# Patient Record
Sex: Female | Born: 1970 | Race: White | Hispanic: No | Marital: Married | Smoking: Current every day smoker
Health system: Western US, Academic
[De-identification: ages and names within clinical notes are randomized; demographics above are authoritative.]

## PROBLEM LIST (undated history)

## (undated) DIAGNOSIS — R188 Other ascites: Secondary | ICD-10-CM

## (undated) DIAGNOSIS — K7460 Unspecified cirrhosis of liver: Secondary | ICD-10-CM

## (undated) DIAGNOSIS — K8020 Calculus of gallbladder without cholecystitis without obstruction: Secondary | ICD-10-CM

## (undated) DIAGNOSIS — K829 Disease of gallbladder, unspecified: Secondary | ICD-10-CM

## (undated) DIAGNOSIS — F988 Other specified behavioral and emotional disorders with onset usually occurring in childhood and adolescence: Secondary | ICD-10-CM

## (undated) DIAGNOSIS — Z8614 Personal history of Methicillin resistant Staphylococcus aureus infection: Secondary | ICD-10-CM

## (undated) DIAGNOSIS — G4700 Insomnia, unspecified: Secondary | ICD-10-CM

## (undated) DIAGNOSIS — Z87898 Personal history of other specified conditions: Secondary | ICD-10-CM

## (undated) DIAGNOSIS — J3089 Other allergic rhinitis: Secondary | ICD-10-CM

## (undated) DIAGNOSIS — F4310 Post-traumatic stress disorder, unspecified: Secondary | ICD-10-CM

## (undated) DIAGNOSIS — J302 Other seasonal allergic rhinitis: Secondary | ICD-10-CM

## (undated) DIAGNOSIS — B009 Herpesviral infection, unspecified: Secondary | ICD-10-CM

## (undated) DIAGNOSIS — F419 Anxiety disorder, unspecified: Secondary | ICD-10-CM

## (undated) DIAGNOSIS — G43909 Migraine, unspecified, not intractable, without status migrainosus: Secondary | ICD-10-CM

## (undated) DIAGNOSIS — F32A Depression, unspecified: Secondary | ICD-10-CM

## (undated) DIAGNOSIS — R002 Palpitations: Secondary | ICD-10-CM

## (undated) DIAGNOSIS — R011 Cardiac murmur, unspecified: Secondary | ICD-10-CM

## (undated) DIAGNOSIS — A4902 Methicillin resistant Staphylococcus aureus infection, unspecified site: Secondary | ICD-10-CM

## (undated) DIAGNOSIS — F411 Generalized anxiety disorder: Secondary | ICD-10-CM

## (undated) DIAGNOSIS — Z973 Presence of spectacles and contact lenses: Secondary | ICD-10-CM

## (undated) HISTORY — DX: Anxiety disorder, unspecified: F41.9

## (undated) HISTORY — DX: Palpitations: R00.2

## (undated) HISTORY — DX: Depression, unspecified: F32.A

## (undated) HISTORY — DX: Disease of gallbladder, unspecified: K82.9

## (undated) HISTORY — DX: Insomnia, unspecified: G47.00

## (undated) HISTORY — DX: Cardiac murmur, unspecified: R01.1

## (undated) HISTORY — DX: Herpesviral infection, unspecified: B00.9

## (undated) HISTORY — PX: REDUCTION MAMMAPLASTY: SUR839

## (undated) HISTORY — DX: Other seasonal allergic rhinitis: J30.2

## (undated) HISTORY — DX: Post-traumatic stress disorder, unspecified: F43.10

## (undated) HISTORY — DX: Other specified behavioral and emotional disorders with onset usually occurring in childhood and adolescence: F98.8

## (undated) HISTORY — DX: Methicillin resistant Staphylococcus aureus infection, unspecified site: A49.02

## (undated) HISTORY — DX: Migraine, unspecified, not intractable, without status migrainosus: G43.909

## (undated) HISTORY — DX: Other ascites: R18.8

## (undated) HISTORY — DX: Unspecified cirrhosis of liver (CMS-HCC): K74.60

---

## 1997-03-10 HISTORY — PX: REDUCTION MAMMAPLASTY: SUR839

## 1998-05-09 ENCOUNTER — Other Ambulatory Visit: Admission: RE | Admit: 1998-05-09 | Discharge: 1998-05-09 | Payer: Self-pay | Admitting: *Deleted

## 1998-05-18 ENCOUNTER — Ambulatory Visit (HOSPITAL_BASED_OUTPATIENT_CLINIC_OR_DEPARTMENT_OTHER): Admission: RE | Admit: 1998-05-18 | Discharge: 1998-05-18 | Payer: Self-pay | Admitting: Specialist

## 1999-08-27 ENCOUNTER — Other Ambulatory Visit: Admission: RE | Admit: 1999-08-27 | Discharge: 1999-08-27 | Payer: Self-pay | Admitting: *Deleted

## 2000-03-26 ENCOUNTER — Inpatient Hospital Stay (HOSPITAL_COMMUNITY): Admission: AD | Admit: 2000-03-26 | Discharge: 2000-03-29 | Payer: Self-pay | Admitting: *Deleted

## 2000-05-12 ENCOUNTER — Other Ambulatory Visit: Admission: RE | Admit: 2000-05-12 | Discharge: 2000-05-12 | Payer: Self-pay | Admitting: *Deleted

## 2001-09-09 ENCOUNTER — Other Ambulatory Visit: Admission: RE | Admit: 2001-09-09 | Discharge: 2001-09-09 | Payer: Self-pay | Admitting: *Deleted

## 2003-02-23 ENCOUNTER — Other Ambulatory Visit: Admission: RE | Admit: 2003-02-23 | Discharge: 2003-02-23 | Payer: Self-pay | Admitting: *Deleted

## 2004-04-09 ENCOUNTER — Other Ambulatory Visit: Admission: RE | Admit: 2004-04-09 | Discharge: 2004-04-09 | Payer: Self-pay | Admitting: Obstetrics and Gynecology

## 2007-08-13 ENCOUNTER — Emergency Department (HOSPITAL_COMMUNITY): Admission: EM | Admit: 2007-08-13 | Discharge: 2007-08-13 | Payer: Self-pay | Admitting: Emergency Medicine

## 2010-07-26 NOTE — H&P (Signed)
[facility]  Patient:    Monica [name], Monica [name]                        MRN: [id number] Adm. Date:  03/26/00 Attending:  Duke Salvia. Marcelle Overlie, M.D.                         History and Physical  CHIEF COMPLAINT:              Postdates pregnancy, large estimated fetal                               weight.  HISTORY OF PRESENT ILLNESS:   A 40 year old G2, P0-0-1-0, EDD is January 15, EGA is 40+ weeks gestation. She was seen in the office January 16 with an ultrasound biophysical profile 8/8, AFI was 15.2 with and EFW of 8.4 pounds. Cervix was closed, long, -3 station. Presents now for two stage induction. Group B strep screen was negative. One-hour GTT was 105. Blood type was A positive. Rubella titer was positive.  PAST MEDICAL HISTORY:  ALLERGIES:                    None.  OPERATIONS:                   TAB x 1. Had prior breast reduction.  REVIEW OF SYSTEMS:            Otherwise unremarkable.  PHYSICAL EXAMINATION:  VITAL SIGNS:                  Temperature 98.2, blood pressure 124/60.  HEENT:                        Unremarkable.  NECK:                         Supple without masses.  LUNGS:                        Clear.  CARDIOVASCULAR:               Regular rate and rhythm without murmurs, rubs, gallops.  BREASTS:                      Not examined.  ABDOMEN:                      Term fundal height. Fetal heart rate 140s. PELVIC:                       Cervix was closed, long, vertex, -3.  EXTREMITIES:                  Unremarkable.  NEURO:                        Unremarkable.  IMPRESSION:                   A 40+ week intrauterine pregnancy, estimated                               fetal weight 8.4 pounds.  PLAN:  Two stage labor induction. This process of Cytotec followed by Pitocin labor induction was reviewed with the patient prior to admission. DD:  03/26/00 TD:  03/26/00 Job: [id number]

## 2010-12-05 LAB — WOUND CULTURE: Gram Stain: NONE SEEN

## 2014-06-22 ENCOUNTER — Other Ambulatory Visit: Payer: Self-pay | Admitting: Nurse Practitioner

## 2014-06-22 ENCOUNTER — Other Ambulatory Visit (HOSPITAL_COMMUNITY)
Admission: RE | Admit: 2014-06-22 | Discharge: 2014-06-22 | Disposition: A | Discharge status: Home / Self Care | Payer: BC Managed Care – PPO | Source: Ambulatory Visit | Attending: Nurse Practitioner | Admitting: Nurse Practitioner

## 2014-06-22 DIAGNOSIS — Z01419 Encounter for gynecological examination (general) (routine) without abnormal findings: Secondary | ICD-10-CM | POA: Insufficient documentation

## 2014-06-22 DIAGNOSIS — Z1151 Encounter for screening for human papillomavirus (HPV): Secondary | ICD-10-CM | POA: Diagnosis present

## 2014-06-26 LAB — CYTOLOGY - PAP

## 2015-07-23 ENCOUNTER — Encounter (HOSPITAL_COMMUNITY): Payer: Self-pay | Admitting: Emergency Medicine

## 2015-07-23 ENCOUNTER — Emergency Department (HOSPITAL_COMMUNITY)
Admission: EM | Admit: 2015-07-23 | Discharge: 2015-07-23 | Disposition: A | Discharge status: Home / Self Care | Payer: BC Managed Care – PPO | Attending: Emergency Medicine | Admitting: Emergency Medicine

## 2015-07-23 DIAGNOSIS — R011 Cardiac murmur, unspecified: Secondary | ICD-10-CM | POA: Diagnosis not present

## 2015-07-23 DIAGNOSIS — F909 Attention-deficit hyperactivity disorder, unspecified type: Secondary | ICD-10-CM | POA: Diagnosis not present

## 2015-07-23 DIAGNOSIS — Z793 Long term (current) use of hormonal contraceptives: Secondary | ICD-10-CM | POA: Diagnosis not present

## 2015-07-23 DIAGNOSIS — Z87891 Personal history of nicotine dependence: Secondary | ICD-10-CM | POA: Insufficient documentation

## 2015-07-23 DIAGNOSIS — Z8619 Personal history of other infectious and parasitic diseases: Secondary | ICD-10-CM | POA: Insufficient documentation

## 2015-07-23 DIAGNOSIS — R002 Palpitations: Secondary | ICD-10-CM | POA: Diagnosis not present

## 2015-07-23 DIAGNOSIS — F419 Anxiety disorder, unspecified: Secondary | ICD-10-CM | POA: Insufficient documentation

## 2015-07-23 DIAGNOSIS — Z7982 Long term (current) use of aspirin: Secondary | ICD-10-CM | POA: Insufficient documentation

## 2015-07-23 DIAGNOSIS — R000 Tachycardia, unspecified: Secondary | ICD-10-CM | POA: Diagnosis not present

## 2015-07-23 DIAGNOSIS — Z8614 Personal history of Methicillin resistant Staphylococcus aureus infection: Secondary | ICD-10-CM | POA: Diagnosis not present

## 2015-07-23 DIAGNOSIS — Z79899 Other long term (current) drug therapy: Secondary | ICD-10-CM | POA: Insufficient documentation

## 2015-07-23 LAB — CBC WITH DIFFERENTIAL/PLATELET
Basophils Absolute: 0 10*3/uL (ref 0.0–0.1)
Basophils Relative: 1 %
Eosinophils Absolute: 0 10*3/uL (ref 0.0–0.7)
Eosinophils Relative: 1 %
HCT: 36.2 % (ref 36.0–46.0)
Hemoglobin: 12.3 g/dL (ref 12.0–15.0)
Lymphocytes Relative: 20 %
Lymphs Abs: 1.4 10*3/uL (ref 0.7–4.0)
MCH: 29.7 pg (ref 26.0–34.0)
MCHC: 34 g/dL (ref 30.0–36.0)
MCV: 87.4 fL (ref 78.0–100.0)
Monocytes Absolute: 0.4 10*3/uL (ref 0.1–1.0)
Monocytes Relative: 6 %
Neutro Abs: 5.3 10*3/uL (ref 1.7–7.7)
Neutrophils Relative %: 74 %
Platelets: 336 10*3/uL (ref 150–400)
RBC: 4.14 MIL/uL (ref 3.87–5.11)
RDW: 13.6 % (ref 11.5–15.5)
WBC: 7.2 10*3/uL (ref 4.0–10.5)

## 2015-07-23 LAB — BASIC METABOLIC PANEL
Anion gap: 9 (ref 5–15)
BUN: 10 mg/dL (ref 6–20)
CO2: 24 mmol/L (ref 22–32)
Calcium: 9 mg/dL (ref 8.9–10.3)
Chloride: 104 mmol/L (ref 101–111)
Creatinine, Ser: 0.82 mg/dL (ref 0.44–1.00)
GFR calc Af Amer: >60 mL/min (ref >60)
GFR calc non Af Amer: >60 mL/min (ref >60)
Glucose, Bld: 104 mg/dL — ABNORMAL HIGH (ref 65–99)
Potassium: 3.6 mmol/L (ref 3.5–5.1)
Sodium: 137 mmol/L (ref 135–145)

## 2015-07-23 MED ORDER — SODIUM CHLORIDE 0.9 % IV BOLUS (SEPSIS)
1000.0000 mL | Freq: Once | INTRAVENOUS | Status: AC
  Administered 2015-07-23: 1000 mL via INTRAVENOUS

## 2015-07-23 NOTE — ED Notes (Addendum)
EMS - Patient was at home with c/o of heart racing and palpitations.  Patient then went to work and started feeling mildly nauseated and continuation of heart racing.  Heart rate as high as 130 with EMS arrival and then down to 104 [facility].  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  Denies chest pain.

## 2015-07-23 NOTE — ED Provider Notes (Signed)
CSN: [id number]     Arrival date & time 07/23/15  7829 History   First MD Initiated Contact with Patient 07/23/15 1023     Chief Complaint  Patient presents with  . Tachycardia     (Consider location/radiation/quality/duration/timing/severity/associated sxs/prior Treatment) HPI Comments: Patient presents with palpitations. She states that she was at home getting ready for work and started feeling her heart was racing. She counted it to the 120s to 130s. She felt fatigued with it but not really lightheaded. No near syncope. She felt a little bit clammy. There is no associated chest pain or shortness of breath. She feels back to baseline now other than feeling fatigued. She's had these episodes in the past and was told that she has mitral valve regurgitation. She's never had a Holter monitor placed. She has not seen cardiology. She states the symptoms   Past Medical History  Diagnosis Date  . Herpes simplex type 1 infection   . Seasonal allergies   . Anxiety disorder   . MRSA infection     left great toe  . ADD (attention deficit disorder)   . Heart murmur    History reviewed. No pertinent past surgical history. Family History  Problem Relation Age of Onset  . Bipolar disorder Mother   . Lung cancer Father    Social History  Substance Use Topics  . Smoking status: Former Smoker    Types: Cigarettes    Quit date: 07/30/2011  . Smokeless tobacco: Never Used  . Alcohol Use: Yes     Comment: occasional   OB History    No data available     Review of Systems  Constitutional: Negative for fever, chills, diaphoresis and fatigue.  HENT: Negative for congestion, rhinorrhea and sneezing.   Eyes: Negative.   Respiratory: Negative for cough, chest tightness and shortness of breath.   Cardiovascular: Positive for palpitations. Negative for chest pain and leg swelling.  Gastrointestinal: Negative for nausea, vomiting, abdominal pain, diarrhea and blood in stool.  Genitourinary:  Negative for frequency, hematuria, flank pain and difficulty urinating.  Musculoskeletal: Negative for back pain and arthralgias.  Skin: Negative for rash.  Neurological: Negative for dizziness, speech difficulty, weakness, numbness and headaches.      Allergies  Sertraline and Septra  Home Medications   Prior to Admission medications   Medication Sig Start Date End Date Taking? Authorizing Provider  albuterol (PROVENTIL HFA;VENTOLIN HFA) 108 (90 Base) MCG/ACT inhaler Inhale 2 puffs into the lungs every 6 (six) hours as needed for wheezing or shortness of breath.   Yes Historical Provider, MD  aspirin 81 MG tablet Take 324 mg by mouth once.   Yes Historical Provider, MD  cetirizine (ZYRTEC) 10 MG tablet Take 10 mg by mouth daily.   Yes Historical Provider, MD  clonazePAM (KLONOPIN) 1 MG tablet Take 1 mg by mouth 2 (two) times daily as needed for anxiety.    Yes Historical Provider, MD  drospirenone-ethinyl estradiol (YASMIN,ZARAH,SYEDA) 3-0.03 MG tablet Take 1 tablet by mouth daily.   Yes Historical Provider, MD  ibuprofen (ADVIL,MOTRIN) 200 MG tablet Take 600 mg by mouth every 6 (six) hours as needed for headache.   Yes Historical Provider, MD  methylphenidate (RITALIN LA) 30 MG 24 hr capsule Take 30 mg by mouth every morning.   Yes Historical Provider, MD  methylphenidate (RITALIN) 10 MG tablet Take 10 mg by mouth 2 (two) times daily.   Yes Historical Provider, MD  traZODone (DESYREL) 100 MG tablet Take 100 mg  by mouth at bedtime.   Yes Historical Provider, MD  valACYclovir (VALTREX) 500 MG tablet Take 500 mg by mouth 2 (two) times daily as needed (flare up).    Yes Historical Provider, MD   BP 116/69 mmHg  Pulse 83  Temp(Src) 98.1 F (36.7 C) (Oral)  Resp 20  Ht 5\' 6"  (1.676 m)  Wt 180 lb (81.647 kg)  BMI 29.07 kg/m2  SpO2 100% Physical Exam  Constitutional: She is oriented to person, place, and time. She appears well-developed and well-nourished.  HENT:  Head: Normocephalic  and atraumatic.  Eyes: Pupils are equal, round, and reactive to light.  Neck: Normal range of motion. Neck supple.  Cardiovascular: Normal rate, regular rhythm and normal heart sounds.   Pulmonary/Chest: Effort normal and breath sounds normal. No respiratory distress. She has no wheezes. She has no rales. She exhibits no tenderness.  Abdominal: Soft. Bowel sounds are normal. There is no tenderness. There is no rebound and no guarding.  Musculoskeletal: Normal range of motion. She exhibits no edema.  Lymphadenopathy:    She has no cervical adenopathy.  Neurological: She is alert and oriented to person, place, and time.  Skin: Skin is warm and dry. No rash noted.  Psychiatric: She has a normal mood and affect.    ED Course  Procedures (including critical care time) Labs Review Labs Reviewed  BASIC METABOLIC PANEL - Abnormal; Notable for the following:    Glucose, Bld 104 (*)    All other components within normal limits  CBC WITH DIFFERENTIAL/PLATELET    Imaging Review No results found. I have personally reviewed and evaluated these images and lab results as part of my medical decision-making.   EKG Interpretation   Date/Time:  Monday Jul 23 2015 11:34:13 EDT Ventricular Rate:  127 PR Interval:  89 QRS Duration: 74 QT Interval:  370 QTC Calculation: 538 R Axis:   86 Text Interpretation:  Sinus tachycardia Paired ventricular premature  complexes Probable left atrial enlargement Low voltage, precordial leads  Borderline T abnormalities, inferior leads Prolonged QT interval SINCE  LAST TRACING HEART RATE HAS INCREASED Confirmed by Vonya Ohalloran  MD, Bettyjean Stefanski  ([id number]) on 07/23/2015 11:47:00 AM      MDM   Final diagnoses:  Palpitations    Patient presents with palpitations. She has no associated chest pain shortness of breath or dizziness. Her EKG is unremarkable. She did have an episode of palpitations in the ED and a follow-up EKG showed a sinus tachycardia. The QTC was prolonged  on this EKG but the QT interval does not appear to be prolonged on her prior EKG from today. I don't see any evidence of Brugada. She was discharged home in good condition. I did give her referral to follow-up with cardiology. She was given precautions to return if she has any worsening symptoms associated with the palpitations would otherwise to follow-up with cardiology.    Rolan BuccoMelanie Alani Lacivita, MD 07/23/15 [phone]

## 2015-07-23 NOTE — Discharge Instructions (Signed)

## 2015-08-31 ENCOUNTER — Encounter: Payer: Self-pay | Admitting: Internal Medicine

## 2015-08-31 ENCOUNTER — Ambulatory Visit (INDEPENDENT_AMBULATORY_CARE_PROVIDER_SITE_OTHER): Payer: BC Managed Care – PPO | Admitting: Internal Medicine

## 2015-08-31 VITALS — BP 138/70 | HR 88 | Ht 66.0 in | Wt 192.8 lb

## 2015-08-31 DIAGNOSIS — R002 Palpitations: Secondary | ICD-10-CM

## 2015-08-31 NOTE — Patient Instructions (Signed)
Medication Instructions:  Your physician recommends that you continue on your current medications as directed. Please refer to the Current Medication list given to you today.   Labwork: none  Testing/Procedures: None  Follow-Up: As needed   If you need a refill on your cardiac medications before your next appointment, please call your pharmacy.

## 2015-08-31 NOTE — Progress Notes (Signed)
Cardiology Office Note   Date:  08/31/2015   ID:  Monica [name], DOB 1970/06/16, MRN [id number]  PCP:  Darrow BussingKOIRALA,DIBAS, MD  Cardiologist:   Dietrich PatesPaula Halton Neas, MD    Patient referred for palpitations Pt seen in ER     History of Present Illness: Monica [name] is a 45 y.o. female with a history of Palpitations.  Pt seen in ER   She had an episode one Monday morning  Felt heart skppping  Didn't feel right  Racing  Sat in car at work  Had to sit down when got in  No Dizziness  Sl chest tightness.  HR still up  Went to ER  Got a little better before arrival there.  She denies CP at other times  TOld she had mitral regurg    Has had palpitations in past.  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 albuterol (PROVENTIL HFA;VENTOLIN HFA) 108 (90 Base) MCG/ACT inhaler Inhale 2 puffs into the lungs every 6 (six) hours as needed for wheezing or shortness of breath.    . cetirizine (ZYRTEC) 10 MG tablet Take 10 mg by mouth daily.    . clonazePAM (KLONOPIN) 1 MG tablet Take 1 mg by mouth 2 (two) times daily as needed for anxiety.     . drospirenone-ethinyl estradiol (YASMIN,ZARAH,SYEDA) 3-0.03 MG tablet Take 1 tablet by mouth daily.    Marland Kitchen. ibuprofen (ADVIL,MOTRIN) 200 MG tablet Take 600 mg by mouth every 6 (six) hours as needed for headache.    . methylphenidate (RITALIN) 10 MG tablet Take 10 mg by mouth daily.     . traZODone (DESYREL) 100 MG tablet Take 100 mg by mouth at bedtime.    . valACYclovir (VALTREX) 500 MG tablet Take 500 mg by mouth 2 (two) times daily as needed (flare up).     Marland Kitchen. aspirin 81 MG tablet Take 324 mg by mouth once.    . methylphenidate (RITALIN LA) 30 MG 24 hr capsule Take 30 mg by mouth every morning.     No facility-administered medications prior to visit.     Allergies:   Sertraline; Pepto-bismol;  and Septra   Past Medical History  Diagnosis Date  . Herpes simplex type 1 infection   . Seasonal allergies   . Anxiety disorder   . MRSA infection     left great toe  . ADD (attention deficit disorder)   . Heart murmur   . Insomnia     unspecified    No past surgical history on file.   Social History:  The patient  reports that she quit smoking about 4 years ago. Her smoking use included Cigarettes. She has never used smokeless tobacco. She reports that she drinks alcohol. She reports that she does not use illicit drugs.   Family History:  The patient's family history includes Bipolar disorder in her mother; Lung cancer in her father.    ROS:  Please see the history of present illness. All other systems are reviewed and  Negative to the above problem except as noted.    PHYSICAL EXAM: VS:  BP 138/70 mmHg  Pulse 88  Ht 5\' 6"  (1.676 m)  Wt 192 lb 12.8 oz (87.454 kg)  BMI 31.13 kg/m2  GEN: Well nourished, well developed, in no acute distress HEENT:  normal Neck: no JVD, carotid bruits, or masses Cardiac: RRR; no murmurs, rubs, or gallops,no edema  Respiratory:  clear to auscultation bilaterally, normal work of breathing GI: soft, nontender, nondistended, + BS  No hepatomegaly  MS: no deformity Moving all extremities   Skin: warm and dry, no rash Neuro:  Strength and sensation are intact Psych: euthymic mood, full affect   EKG:  EKG is not ordered today.  On 5/16  SR 99 bpm   Nonspecific ST changes      Lipid Panel No results found for: CHOL, TRIG, HDL, CHOLHDL, VLDL, LDLCALC, LDLDIRECT    Wt Readings from Last 3 Encounters:  08/31/15 192 lb 12.8 oz (87.454 kg)  07/23/15 180 lb (81.647 kg)      ASSESSMENT AND PLAN:  1  Palpitations  Not clear what "spell" was  Good that she was not that sympomtatic   I would follow  If recurs would set up for event monitor Stay hyrdated.  Stay active    2  ? MR  Pt told she hd in past  No murmur on exam  If has I do not  think clinically significant     Signed, Dietrich PatesPaula Kaitland Lewellyn, MD  08/31/2015 12:23 PM    [facility] [address], [place], [place]  [address] Phone: [phone]; Fax: [phone]

## 2015-09-04 ENCOUNTER — Telehealth: Payer: Self-pay | Admitting: Internal Medicine

## 2015-09-04 NOTE — Telephone Encounter (Signed)
New message     Please fax the office note from Dr. Tenny Crawoss on last Friday and recommendation please fax to [phone]

## 2016-06-18 ENCOUNTER — Encounter (HOSPITAL_COMMUNITY): Payer: Self-pay | Admitting: Emergency Medicine

## 2016-06-18 ENCOUNTER — Ambulatory Visit (HOSPITAL_COMMUNITY)
Admission: EM | Admit: 2016-06-18 | Discharge: 2016-06-18 | Disposition: A | Discharge status: Home / Self Care | Payer: BC Managed Care – PPO | Attending: Emergency Medicine | Admitting: Emergency Medicine

## 2016-06-18 DIAGNOSIS — R110 Nausea: Secondary | ICD-10-CM

## 2016-06-18 DIAGNOSIS — G43709 Chronic migraine without aura, not intractable, without status migrainosus: Secondary | ICD-10-CM | POA: Diagnosis not present

## 2016-06-18 MED ORDER — ONDANSETRON HCL 4 MG PO TABS: 4.0000 mg | ORAL_TABLET | Freq: Four times a day (QID) | ORAL | 0 refills | Status: DC

## 2016-06-18 MED ORDER — DEXAMETHASONE SODIUM PHOSPHATE 10 MG/ML IJ SOLN
10.0000 mg | Freq: Once | INTRAMUSCULAR | Status: AC
  Administered 2016-06-18: 10 mg via INTRAMUSCULAR

## 2016-06-18 MED ORDER — DEXAMETHASONE SODIUM PHOSPHATE 10 MG/ML IJ SOLN: 10.0000 mg | Freq: Once | INTRAMUSCULAR | Status: DC

## 2016-06-18 MED ORDER — SUMATRIPTAN SUCCINATE 25 MG PO TABS: 25.0000 mg | ORAL_TABLET | ORAL | 0 refills | Status: DC | PRN

## 2016-06-18 MED ORDER — ONDANSETRON 4 MG PO TBDP
4.0000 mg | ORAL_TABLET | Freq: Once | ORAL | Status: AC
Start: 2016-06-18 — End: 2016-06-18
  Administered 2016-06-18: 4 mg via ORAL

## 2016-06-18 MED ORDER — ONDANSETRON 4 MG PO TBDP
ORAL_TABLET | ORAL | Status: AC
  Filled 2016-06-18: qty 1

## 2016-06-18 MED ORDER — DEXAMETHASONE SODIUM PHOSPHATE 10 MG/ML IJ SOLN
INTRAMUSCULAR | Status: AC
  Filled 2016-06-18: qty 1

## 2016-06-18 NOTE — ED Triage Notes (Addendum)
Headache Sunday, headache reoccurred on right side of head. Patient thinks there was something that was sprayed at work that made headache worse, associated with nausea, diarrhea

## 2016-07-04 NOTE — ED Provider Notes (Signed)
CSN: [id number]     Arrival date & time 06/18/16  1935 History   First MD Initiated Contact with Patient 06/18/16 1951     Chief Complaint  Patient presents with  . Headache   (Consider location/radiation/quality/duration/timing/severity/associated sxs/prior Treatment) HPI  Past Medical History:  Diagnosis Date  . ADD (attention deficit disorder)   . Anxiety disorder   . Heart murmur   . Herpes simplex type 1 infection   . Insomnia    unspecified  . MRSA infection    left great toe  . Seasonal allergies    History reviewed. No pertinent surgical history. Family History  Problem Relation Age of Onset  . Bipolar disorder Mother   . Lung cancer Father    Social History  Substance Use Topics  . Smoking status: Former Smoker    Types: Cigarettes    Quit date: 07/30/2011  . Smokeless tobacco: Never Used  . Alcohol use Yes     Comment: occasional   OB History    No data available     Review of Systems  Constitutional: Negative.   HENT: Negative.   Eyes: Negative.   Respiratory: Negative.   Cardiovascular: Negative.   Neurological: Positive for headaches.    Allergies  Sertraline; Pepto-bismol [bismuth subsalicylate]; and Septra [sulfamethoxazole-trimethoprim]  Home Medications   Prior to Admission medications   Medication Sig Start Date End Date Taking? Authorizing Provider  amphetamine-dextroamphetamine (ADDERALL XR) 25 MG 24 hr capsule Take 25 mg by mouth every morning.   Yes Historical Provider, MD  cetirizine (ZYRTEC) 10 MG tablet Take 10 mg by mouth daily.   Yes Historical Provider, MD  clonazePAM (KLONOPIN) 1 MG tablet Take 1 mg by mouth 2 (two) times daily as needed for anxiety.    Yes Historical Provider, MD  ibuprofen (ADVIL,MOTRIN) 200 MG tablet Take 600 mg by mouth every 6 (six) hours as needed for headache.   Yes Historical Provider, MD  traZODone (DESYREL) 100 MG tablet Take 100 mg by mouth at bedtime.   Yes Historical Provider, MD  albuterol  (PROVENTIL HFA;VENTOLIN HFA) 108 (90 Base) MCG/ACT inhaler Inhale 2 puffs into the lungs every 6 (six) hours as needed for wheezing or shortness of breath.    Historical Provider, MD  drospirenone-ethinyl estradiol (YASMIN,ZARAH,SYEDA) 3-0.03 MG tablet Take 1 tablet by mouth daily.    Historical Provider, MD  methylphenidate (RITALIN) 10 MG tablet Take 10 mg by mouth daily.     Historical Provider, MD  ondansetron (ZOFRAN) 4 MG tablet Take 1 tablet (4 mg total) by mouth every 6 (six) hours. 06/18/16   Tobi Bastos, NP  SUMAtriptan (IMITREX) 25 MG tablet Take 1 tablet (25 mg total) by mouth every 2 (two) hours as needed for migraine. May repeat in 2 hours if headache persists or recurs. 06/18/16   Tobi Bastos, NP  valACYclovir (VALTREX) 500 MG tablet Take 500 mg by mouth 2 (two) times daily as needed (flare up).     Historical Provider, MD   Meds Ordered and Administered this Visit   Medications  dexamethasone (DECADRON) injection 10 mg (10 mg Intramuscular Given 06/18/16 2009)  ondansetron (ZOFRAN-ODT) disintegrating tablet 4 mg (4 mg Oral Given 06/18/16 2015)    BP 128/85 (BP Location: Left Arm)   Pulse 98   Temp 98.2 F (36.8 C) (Oral)   Resp 18   LMP 05/16/2016   SpO2 99%  No data found.   Physical Exam  Constitutional: She appears well-developed.  HENT:  Head: Normocephalic.  Eyes: Pupils are equal, round, and reactive to light.  Neck: Normal range of motion.  Cardiovascular: Normal rate and regular rhythm.   Pulmonary/Chest: Effort normal.  Abdominal: Soft.  Neurological: She is alert.  [place] headache, alert x4, no aura,   Skin: Skin is warm.    Urgent Care Course     Procedures (including critical care time)  Labs Review Labs Reviewed - No data to display  Imaging Review No results found.           MDM   1. Chronic migraine without aura without status migrainosus, not intractable   2. Nausea    Take nausea medication as needed  Go to the  er if sx are worse or you begin to have weakness     Tobi Bastos, NP 07/04/16 1042

## 2016-10-21 ENCOUNTER — Other Ambulatory Visit: Payer: Self-pay | Admitting: Nurse Practitioner

## 2016-10-21 DIAGNOSIS — Z1231 Encounter for screening mammogram for malignant neoplasm of breast: Secondary | ICD-10-CM

## 2016-10-31 ENCOUNTER — Ambulatory Visit
Admission: RE | Admit: 2016-10-31 | Discharge: 2016-10-31 | Disposition: A | Discharge status: Home / Self Care | Payer: BC Managed Care – PPO | Source: Ambulatory Visit | Attending: Nurse Practitioner | Admitting: Nurse Practitioner

## 2016-10-31 DIAGNOSIS — Z1231 Encounter for screening mammogram for malignant neoplasm of breast: Secondary | ICD-10-CM

## 2016-11-18 ENCOUNTER — Encounter: Payer: Self-pay | Admitting: Neurology

## 2016-12-18 ENCOUNTER — Ambulatory Visit: Payer: BC Managed Care – PPO | Admitting: Neurology

## 2017-01-19 ENCOUNTER — Encounter: Payer: Self-pay | Admitting: Neurology

## 2017-01-19 ENCOUNTER — Ambulatory Visit: Payer: BC Managed Care – PPO | Admitting: Neurology

## 2017-01-19 VITALS — BP 126/77 | HR 93 | Ht 66.0 in | Wt 196.0 lb

## 2017-01-19 DIAGNOSIS — H539 Unspecified visual disturbance: Secondary | ICD-10-CM | POA: Diagnosis not present

## 2017-01-19 DIAGNOSIS — G932 Benign intracranial hypertension: Secondary | ICD-10-CM | POA: Diagnosis not present

## 2017-01-19 DIAGNOSIS — G43709 Chronic migraine without aura, not intractable, without status migrainosus: Secondary | ICD-10-CM | POA: Diagnosis not present

## 2017-01-19 DIAGNOSIS — R51 Headache with orthostatic component, not elsewhere classified: Secondary | ICD-10-CM

## 2017-01-19 DIAGNOSIS — R519 Headache, unspecified: Secondary | ICD-10-CM

## 2017-01-19 MED ORDER — DICLOFENAC SODIUM 50 MG PO TBEC: DELAYED_RELEASE_TABLET | ORAL | 4 refills | Status: DC

## 2017-01-19 MED ORDER — AMITRIPTYLINE HCL 25 MG PO TABS: 25.0000 mg | ORAL_TABLET | Freq: Every day | ORAL | 11 refills | Status: DC

## 2017-01-19 MED ORDER — RIZATRIPTAN BENZOATE 10 MG PO TABS: 10.0000 mg | ORAL_TABLET | ORAL | 11 refills | Status: DC | PRN

## 2017-01-19 NOTE — Progress Notes (Signed)
[facility]    Provider:  Dr Lucia GaskinsAhern Referring Provider: Darrow BussingKoirala, Dibas, MD Primary Care Physician:  Darrow BussingKoirala, Dibas, MD  CC:  migraine  HPI:  Monica [name] is a 46 y.o. female here as a referral from Dr. Docia ChuckKoirala for migraine.  Past medical history anxiety, ADD, PTSD, insomnia. Migraines started 2 years ago, no known FHx but doesn't know a lot about her Fhx however. They can be debilitating. Not related to stress, may last over 24 hours at worst and cause her to be in bed all day. She has light sensitivity, nausea, noise sensitivity, dark rooms better, lights trigger migraines, can be unilateral and throbbing/pulsating, can be severe as high as 10/10. She has been to urgent care due to migraines. Migraines mostly around periods and tried new pills, tried estrogen during periods without help. Maybe 9 headache days a month, all are migrainous. 15 headache days a months, not just during her menses. She was exposed to chemical at work and headaches worsening for 3 years.  She has woken up with headaches. She has vision changes, blurry vision exacerbated by the headaches. She can have double vision with the migraines. No focal weakness. No neck pain. No aura. No medication overuse. No other focal neurologic deficits, associated symptoms, inciting events or modifiable factors.  Meds used: Imitrex helps but doesn't take the migraine away, Naproxen also helps but not entirely  Reviewed notes, labs and imaging from outside physicians, which showed:   Reviewed primary care notes from equal physicians, she reports headaches that started on the last day or placebo of first day of..  Last about 1-2 days.  He is away on its own.  She typically self treats.  Happy with her pills.  Periods are regular and light.  Reviewed exam which was normal.  Cbc/bmp unremarkable  Review of Systems: Patient complains of symptoms per HPI as well as the following symptoms: Insomnia, headaches, anxiety.  Pertinent negatives and positives per HPI. All others negative.   Social History   Socioeconomic History  . Marital status: Divorced    Spouse name: Not on file  . Number of children: 1  . Years of education: Not on file  . Highest education level: Bachelor's degree (e.g., BA, AB, BS)  Social Needs  . Financial resource strain: Not on file  . Food insecurity - worry: Not on file  . Food insecurity - inability: Not on file  . Transportation needs - medical: Not on file  . Transportation needs - non-medical: Not on file  Occupational History  . Not on file  Tobacco Use  . Smoking status: Former Smoker    Types: Cigarettes    Last attempt to quit: 07/30/2011    Years since quitting: 5.4  . Smokeless tobacco: Never Used  Substance and Sexual Activity  . Alcohol use: Yes    Comment: occasional  . Drug use: No  . Sexual activity: Not on file  Other Topics Concern  . Not on file  Social History Narrative   Lives at home with fiance   Right handed   2 cups of caffeine daily    Family History  Problem Relation Age of Onset  . Bipolar disorder Mother   . Lung cancer Father   . Migraines Neg Hx     Past Medical History:  Diagnosis Date  . ADD (attention deficit disorder)   . Anxiety disorder   . Heart murmur   . Herpes simplex type 1 infection   .  Insomnia    unspecified  . Migraines   . MRSA infection    left great toe  . PTSD (post-traumatic stress disorder)   . Seasonal allergies     Past Surgical History:  Procedure Laterality Date  . REDUCTION MAMMAPLASTY      Current Outpatient Medications  Medication Sig Dispense Refill  . albuterol (PROVENTIL HFA;VENTOLIN HFA) 108 (90 Base) MCG/ACT inhaler Inhale 2 puffs into the lungs every 6 (six) hours as needed for wheezing or shortness of breath.    . amphetamine-dextroamphetamine (ADDERALL XR) 25 MG 24 hr capsule Take 25 mg by mouth every morning.    . cetirizine (ZYRTEC) 10 MG tablet Take 10 mg by mouth daily.     . clonazePAM (KLONOPIN) 1 MG tablet Take 1 mg by mouth 2 (two) times daily as needed for anxiety.     . drospirenone-ethinyl estradiol (YASMIN,ZARAH,SYEDA) 3-0.03 MG tablet Take 1 tablet by mouth daily.    . fluticasone (FLONASE) 50 MCG/ACT nasal spray Place 1 spray daily into both nostrils.    Marland Kitchen. ibuprofen (ADVIL,MOTRIN) 200 MG tablet Take 600 mg by mouth every 6 (six) hours as needed for headache.    . methylphenidate (RITALIN) 10 MG tablet Take 10 mg by mouth daily.     . ondansetron (ZOFRAN) 4 MG tablet Take 1 tablet (4 mg total) by mouth every 6 (six) hours. 12 tablet 0  . traZODone (DESYREL) 100 MG tablet Take 100 mg by mouth at bedtime.    . valACYclovir (VALTREX) 500 MG tablet Take 500 mg by mouth 2 (two) times daily as needed (flare up).     Marland Kitchen. amitriptyline (ELAVIL) 25 MG tablet Take 1 tablet (25 mg total) at bedtime by mouth. 30 tablet 11  . diclofenac (VOLTAREN) 50 MG EC tablet Take with Maxalt or alone for headache up to twice adily 30 tablet 4  . rizatriptan (MAXALT) 10 MG tablet Take 1 tablet (10 mg total) as needed by mouth for migraine. May repeat in 2 hours if needed 10 tablet 11   No current facility-administered medications for this visit.     Allergies as of 01/19/2017 - Review Complete 01/19/2017  Allergen Reaction Noted  . Sertraline Other (See Comments) 07/29/2013  . Pepto-bismol [bismuth subsalicylate] Rash 08/22/2015  . Septra [sulfamethoxazole-trimethoprim] Rash 07/29/2013    Vitals: BP 126/77 (BP Location: Right Arm, Patient Position: Sitting)   Pulse 93   Ht 5\' 6"  (1.676 m)   Wt 196 lb (88.9 kg)   BMI 31.64 kg/m  Last Weight:  Wt Readings from Last 1 Encounters:  01/19/17 196 lb (88.9 kg)   Last Height:   Ht Readings from Last 1 Encounters:  01/19/17 5\' 6"  (1.676 m)    Physical exam: Exam: Gen: NAD, conversant, well nourised, obese, well groomed                     CV: RRR, no MRG. No Carotid Bruits. No peripheral edema, warm, nontender Eyes:  Conjunctivae clear without exudates or hemorrhage  Neuro: Detailed Neurologic Exam  Speech:    Speech is normal; fluent and spontaneous with normal comprehension.  Cognition:    The patient is oriented to person, place, and time;     recent and remote memory intact;     language fluent;     normal attention, concentration,     fund of knowledge Cranial Nerves:    The pupils are equal, round, and reactive to light. The fundi are normal and  spontaneous venous pulsations are present. Visual fields are full to finger confrontation. Extraocular movements are intact. Trigeminal sensation is intact and the muscles of mastication are normal. The face is symmetric. The palate elevates in the midline. Hearing intact. Voice is normal. Shoulder shrug is normal. The tongue has normal motion without fasciculations.   Coordination:    Normal finger to nose and heel to shin. Normal rapid alternating movements.   Gait:    Heel-toe and tandem gait are normal.   Motor Observation:    No asymmetry, no atrophy, and no involuntary movements noted. Tone:    Normal muscle tone.    Posture:    Posture is normal. normal erect    Strength:    Strength is V/V in the upper and lower limbs.      Sensation: intact to LT     Reflex Exam:  DTR's:    Deep tendon reflexes in the upper and lower extremities are normal bilaterally.   Toes:    The toes are downgoing bilaterally.   Clonus:    Clonus is absent.      Assessment/Plan:  Monica [name] is a 46 y.o. female here as a referral from Dr. Docia Chuck for migraine.  Past medical history anxiety, ADD, PTSD, insomnia. Migraines started 2 years ago, 15 headache days a month of which 9 are migrainous.   - keep a Migraine diary, we can review at next appt in 8 weeks - MRI brain w/wo contrast given positional quality with vision changes ot rule out lesion, space-occupying mass or Intracranial idiopathic HTN - Maxalt at onset for acute management, can take  with Naproxen or Diclofenac (not both) as well as antiemetic - Will try Amitriptyline for prevention which may help with her insomnia as well, can increase as tolerated and needed, can also consider Topiramate   Discussed: To prevent or relieve headaches, try the following: Cool Compress. Lie down and place a cool compress on your head.  Avoid headache triggers. If certain foods or odors seem to have triggered your migraines in the past, avoid them. A headache diary might help you identify triggers.  Include physical activity in your daily routine. Try a daily walk or other moderate aerobic exercise.  Manage stress. Find healthy ways to cope with the stressors, such as delegating tasks on your to-do list.  Practice relaxation techniques. Try deep breathing, yoga, massage and visualization.  Eat regularly. Eating regularly scheduled meals and maintaining a healthy diet might help prevent headaches. Also, drink plenty of fluids.  Follow a regular sleep schedule. Sleep deprivation might contribute to headaches Consider biofeedback. With this mind-body technique, you learn to control certain bodily functions - such as muscle tension, heart rate and blood pressure - to prevent headaches or reduce headache pain.    Proceed to emergency room if you experience new or worsening symptoms or symptoms do not resolve, if you have new neurologic symptoms or if headache is severe, or for any concerning symptom.   Provided education and documentation from American headache Society toolbox including articles on: chronic migraine medication overuse headache, chronic migraines, prevention of migraines, behavioral and other nonpharmacologic treatments for headache.  Orders Placed This Encounter  Procedures  . MR BRAIN W WO CONTRAST    Cc: Dr. Jearld Shines, MD  [facility] [address] [place], [place] [address]  Phone [phone] Fax [phone]

## 2017-01-19 NOTE — Patient Instructions (Addendum)
Magnesium citrate 400mg  to 600mg  daily, riboflavin 400mg , Coenzyme Q 10 100mg  three times daily Consider joining Facebook group [facility]. Start Amitriptyline at night every night for migraine and insomnia, may need to increase emailin 4-6 weeks. Next suggest Topiramate. At onset of migraine: Maxalt (Rizatriptan and diclofenac). May repeat in 2 hours max 2x in one day. Can also take Anti-nausea Zofran. Look up "CGRP Migraine", suggest [facility] Stop imitrex, tru to reduce Trazodone (Amitriptyline + Trazodone may cause excessive sedation)   Migraine Headache A migraine headache is an intense, throbbing pain on one side or both sides of the head. Migraines may also cause other symptoms, such as nausea, vomiting, and sensitivity to light and noise. What are the causes? Doing or taking certain things may also trigger migraines, such as:  Alcohol.  Smoking.  Medicines, such as: ? Medicine used to treat chest pain (nitroglycerine). ? Birth control pills. ? Estrogen pills. ? Certain blood pressure medicines.  Aged cheeses, chocolate, or caffeine.  Foods or drinks that contain nitrates, glutamate, aspartame, or tyramine.  Physical activity.  Other things that may trigger a migraine include:  Menstruation.  Pregnancy.  Hunger.  Stress, lack of sleep, too much sleep, or fatigue.  Weather changes.  What increases the risk? The following factors may make you more likely to experience migraine headaches:  Age. Risk increases with age.  Family history of migraine headaches.  Being Caucasian.  Depression and anxiety.  Obesity.  Being a woman.  Having a hole in the heart (patent foramen ovale) or other heart problems.  What are the signs or symptoms? The main symptom of this condition is pulsating or throbbing pain. Pain may:  Happen in any area of the head, such as on one side or both sides.  Interfere with daily activities.  Get  worse with physical activity.  Get worse with exposure to bright lights or loud noises.  Other symptoms may include:  Nausea.  Vomiting.  Dizziness.  General sensitivity to bright lights, loud noises, or smells.  Before you get a migraine, you may get warning signs that a migraine is developing (aura). An aura may include:  Seeing flashing lights or having blind spots.  Seeing bright spots, halos, or zigzag lines.  Having tunnel vision or blurred vision.  Having numbness or a tingling feeling.  Having trouble talking.  Having muscle weakness.  How is this diagnosed? A migraine headache can be diagnosed based on:  Your symptoms.  A physical exam.  Tests, such as CT scan or MRI of the head. These imaging tests can help rule out other causes of headaches.  Taking fluid from the spine (lumbar puncture) and analyzing it (cerebrospinal fluid analysis, or CSF analysis).  How is this treated? A migraine headache is usually treated with medicines that:  Relieve pain.  Relieve nausea.  Prevent migraines from coming back.  Treatment may also include:  Acupuncture.  Lifestyle changes like avoiding foods that trigger migraines.  Follow these instructions at home: Medicines  Take over-the-counter and prescription medicines only as told by your health care provider.  Do not drive or use heavy machinery while taking prescription pain medicine.  To prevent or treat constipation while you are taking prescription pain medicine, your health care provider may recommend that you: ? Drink enough fluid to keep your urine clear or pale yellow. ? Take over-the-counter or prescription medicines. ? Eat foods that are high in fiber, such as fresh fruits and vegetables,  whole grains, and beans. ? Limit foods that are high in fat and processed sugars, such as fried and sweet foods. Lifestyle  Avoid alcohol use.  Do not use any products that contain nicotine or tobacco, such as  cigarettes and e-cigarettes. If you need help quitting, ask your health care provider.  Get at least 8 hours of sleep every night.  Limit your stress. General instructions   Keep a journal to find out what may trigger your migraine headaches. For example, write down: ? What you eat and drink. ? How much sleep you get. ? Any change to your diet or medicines.  If you have a migraine: ? Avoid things that make your symptoms worse, such as bright lights. ? It may help to lie down in a dark, quiet room. ? Do not drive or use heavy machinery. ? Ask your health care provider what activities are safe for you while you are experiencing symptoms.  Keep all follow-up visits as told by your health care provider. This is important. Contact a health care provider if:  You develop symptoms that are different or more severe than your usual migraine symptoms. Get help right away if:  Your migraine becomes severe.  You have a fever.  You have a stiff neck.  You have vision loss.  Your muscles feel weak or like you cannot control them.  You start to lose your balance often.  You develop trouble walking.  You faint. This information is not intended to replace advice given to you by your health care provider. Make sure you discuss any questions you have with your health care provider. Document Released: 02/24/2005 Document Revised: 09/14/2015 Document Reviewed: 08/13/2015 Elsevier Interactive Patient Education  2017 [employer]

## 2017-01-20 ENCOUNTER — Telehealth: Payer: Self-pay | Admitting: Neurology

## 2017-01-20 ENCOUNTER — Encounter: Payer: Self-pay | Admitting: Neurology

## 2017-01-20 DIAGNOSIS — G43709 Chronic migraine without aura, not intractable, without status migrainosus: Secondary | ICD-10-CM | POA: Insufficient documentation

## 2017-01-20 NOTE — Telephone Encounter (Signed)
I spoke to the patient to schedule her MRI I informed her that the cost would be about $1,318.31. She was wondering if it is okay for her to wait until January after the holidays to have this exam done. Please advise?

## 2017-01-21 NOTE — Telephone Encounter (Signed)
Noted, thank you

## 2017-01-21 NOTE — Telephone Encounter (Signed)
That's fine. thanks

## 2017-03-30 ENCOUNTER — Ambulatory Visit: Payer: BC Managed Care – PPO | Admitting: Neurology

## 2017-10-19 ENCOUNTER — Encounter (INDEPENDENT_AMBULATORY_CARE_PROVIDER_SITE_OTHER): Payer: Self-pay

## 2017-10-20 ENCOUNTER — Encounter (INDEPENDENT_AMBULATORY_CARE_PROVIDER_SITE_OTHER): Payer: Self-pay | Admitting: Nurse Practitioner

## 2017-10-20 NOTE — Progress Notes (Signed)
Patient records were obtained from: Dr. Ranae PlumberPolitoski  [address] [place] [id number]  P: [phone] F: [phone]    Approximately 30 minutes of non face-to-face time was spent in review of patient's clinical course to date, labs, imaging studies, procedures, surgical interventions, specialty consultations and the findings are summarized below:    This is a [age]y F with alcoholic hepatitis/cirrhosis c/b ascites despite furosemide 80mg  daily and spironolactone 200mg  daily, requiring large volume paracentesis, pruritis (which hydroxyzine 25mg  has not helped), jaundice, and hepatic encephalopathy. Per reports, she was well until September 12 2017 when she noted jaundice and abdominal distention which [facility]. Ct abdomen was done which revealed ascites, nodular liver and splenomegaly c/w cirrhosis.     She has ongoing fatigue and unable to work. Per notes, she has no weight loss and is continuing her boost supplement as needed.     Hospitalization history:  July 6- [facility]  July 12-July 14 [facility]  July [facility]       Pertinent Labs:  10/13/17    WBC 15.2, RBC 3.31, Hgb 11.6, Hct 33, MCV 100, MCH 35, MCHC 35.2, RDW 15.6, Plt 269, MPV 7.5, Segs 77, Bands 7, Lymphs 10, Monos 4, Eos 2, Baso 1    PT 18.9, INR 1.6    Glu 118, Na 132, K 4.4, Cl 92, CO2 26, BUN 16, Creat 1.2, [place] 8.9, GFR Non AA 51, GFR AA >60    ALP 230, AST 138, ALT 39, Bili 26.5, Pro 6.2, Alb 3.1    MELD-Na: 29  Childs Pugh-C    Ferritin 642, Iron 57, UIBC 47, Iron Sat 55    Serologies: 10/06/17  HAV Ab Total Neg   HAV Ab IgM Negative     HBcAb Negative  HBSAg Neg  HBsAb Negative     HCV Ab Negative    Rheum 10/06/17  ANA Positive   ANA Pattern Homogeneous  ANA Titer 1:640  AMA Ab Negative   ASMAb Negative    Ceruloplasmin 32  A1AT Ph M1M1, 261    Liver Kidney Microsome Ab IgG <1:20    Procedures:  Paracentesis 1.05L removed       After reviewing the records we will need the following:    --Date of last  drink (it sounds as if he last drink was July 2019)  -- CT Abd w/wo contrast ([facility] and report through [facility])  -- Serologies: CMV IgG/IgM, EBV IgG/IgM, HSV I/II IgG, HIV, RPR  -- complete Phase I Labs

## 2017-10-26 ENCOUNTER — Telehealth (INDEPENDENT_AMBULATORY_CARE_PROVIDER_SITE_OTHER): Payer: Self-pay

## 2017-10-26 NOTE — Telephone Encounter (Signed)
Called patient back scheduled new clinic appointment in Liver Transplant on 11/10/17 with Dr.Mendler. Let her know a caregiver must come to her appointment. Paper work should be fill out as well. Liver transplant packet with paper work has been mail to patient today.

## 2017-10-26 NOTE — Telephone Encounter (Signed)
Patient called in advising she received a authorization letter in the mail. Patient advised she has called 3 times with no f/u. Advised patient you were unavailable to take the call at this time. Advised patient will mark mssg as high priority. Patient is requesting a callback at [phone]

## 2017-11-02 ENCOUNTER — Encounter (INDEPENDENT_AMBULATORY_CARE_PROVIDER_SITE_OTHER): Payer: PRIVATE HEALTH INSURANCE

## 2017-11-06 ENCOUNTER — Telehealth (INDEPENDENT_AMBULATORY_CARE_PROVIDER_SITE_OTHER): Payer: Self-pay

## 2017-11-06 NOTE — Telephone Encounter (Signed)
NEW EVAL APPOINTMENT     Called patient and LM regarding new liver transplant  consult scheduled 11/10/17 with Dr. Jorge MandrilMnndler  Reminded they bring caregiver/s to appointment, as they will be required to assist them through the transplant process, and to bring immmuzation card with with them as well

## 2017-11-10 ENCOUNTER — Ambulatory Visit (INDEPENDENT_AMBULATORY_CARE_PROVIDER_SITE_OTHER): Payer: PRIVATE HEALTH INSURANCE | Admitting: Gastroenterology

## 2017-11-10 ENCOUNTER — Encounter (INDEPENDENT_AMBULATORY_CARE_PROVIDER_SITE_OTHER): Payer: Self-pay | Admitting: Gastroenterology

## 2017-11-10 VITALS — BP 118/76 | HR 89 | Temp 98.7°F | Resp 16 | Ht 62.0 in | Wt 131.2 lb

## 2017-11-10 DIAGNOSIS — K7011 Alcoholic hepatitis with ascites: Secondary | ICD-10-CM

## 2017-11-10 DIAGNOSIS — L299 Pruritus, unspecified: Secondary | ICD-10-CM

## 2017-11-10 DIAGNOSIS — K7031 Alcoholic cirrhosis of liver with ascites: Secondary | ICD-10-CM

## 2017-11-10 MED ORDER — SPIRONOLACTONE 100 MG OR TABS
200.00 mg | ORAL_TABLET | Freq: Every day | ORAL | 3 refills | Status: DC
Start: 2017-10-25 — End: 2017-11-30

## 2017-11-10 MED ORDER — HYDROXYZINE HCL 25 MG OR TABS
ORAL_TABLET | ORAL | 3 refills | Status: DC
Start: 2017-10-28 — End: 2017-11-10

## 2017-11-10 MED ORDER — LACTULOSE 10 GM/15ML OR SOLN
20.00 g | ORAL | Status: DC
Start: 2017-09-23 — End: 2018-03-30

## 2017-11-10 MED ORDER — HYDROXYZINE HCL 25 MG OR TABS
ORAL_TABLET | ORAL | 3 refills | Status: DC
Start: 2017-11-10 — End: 2018-12-20

## 2017-11-10 MED ORDER — FUROSEMIDE 40 MG OR TABS
ORAL_TABLET | ORAL | 3 refills | Status: DC
Start: 2017-10-25 — End: 2017-11-30

## 2017-11-10 NOTE — Progress Notes (Signed)
Liver Transplant Nurse Practitioner Note    Liver Transplant NP clinic note- initial visit for liver transplant . Discussed with patient and  the following: evaluation for liver transplant candidates, work up for extra hepatic HCC disease, MELD score, comprehensive lab eval. Discussed that we will notify his insurance of intent to move forward with evaluation and that they will have to open a case.    We will request the following:     All Patients:   1.  -- done 09/30/17  IMPRESSION:  Normal sinus rhythm  Minimal voltage criteria for LVH, may be normal variant  Borderline ECG  No previous ECGs available    2. Echocardiogram   (Agitated saline should only be used if hepatopulmonary syndrome is suspected based on the presence of a room air saturation <98%, clubbing or other clinical suspicion)   3. Cancer screening: colonoscopy not indicated at this time <50yo and no family history of coloncancer, mammography <50y but has a family history of breast cancer (done at [facility] [place]) Need records, pap smear (in women)  4. Screening for Latent Tuberculosis: Obtain Quantiferon Gold or PPD. If positive refer to Transplant Infectious Disease team   5.  Done at [facility] 09/26/17  IMPRESSION: Hypoventilatory examination with minimal bibasilar atelectatic/inflammatory changes and questionable tiny bilateral pleural effusions.    6. Hepatic imaging with contrast enhanced triple phase computed tomography or magnetic resonance imaging. Needs CT Abdomen w/wo contrast   7. Urinalysis with micro  8. Urine toxicology screen, nicotine/cotinine, PeTH   9. ; not indicated    Pre-transplant labs:   a. Infectious: TP-PA, Toxo IgG, Coccidoides, HIV, RPR, HTLV, CMV IgG/IgM, EBV IgG/IgM, HSV I/II IgG/IgM, HAV IgG, HBsAb, HBsAg,  HBcAb, HCV Ab (cryoglobulins in patients with HCV), HCV RNA, HCV Gt, HBeAg, HBeAb.  b. Non-Infectious: ABO, CBC with differential, CMP, direct bilirubin, PT, AFP, Magnesium, iron, ferritin, TIBC, lipid  panel, Rh, TSH, IgG/A/M,     All studies will need to be approved through insurance before scheduling tests. Provided my direct contact number to patient with Wayna Chalet to assist in appointments and other issues related to transplant evaluation.      Acknowledgement Form  Discussed with the patient the following information related to patient education: the role of the transplant team, general overview of transplant, organ allocation system, MELD score, plan of care, matching the cadaver donor organ to the recipient, surgical procedure (including anesthesia- intubation, surgical incision, requirement for blood products transfusion, ICU length of stay and overall length of stay after liver transplant). Reviewed both short and long term complications associated with liver transplantation. Reviewed potential complications from transplant surgery including the following but not limited to: bleeding and requiring possible re-exploration, infections, biliary complications, thrombosis of blood vessels, HAT, PNF and delayed graft function. Reviewed the importance of long term follow up and care, medication compliance and side effects  of immune suppression therapy including diabetes mellitus, hypertensive disorder, hyperlipidemia, renal dysfunction and CKD. Additional medication S/E including but not limited to: fluid retention, hair loss or growth, gum growth, tremors, sleep disturbances, mood swings, headaches, neuropathy, diarrhea and low blood counts. Requirement for lifelong immuno-suppressant therapy that is to be taken as instructed at specific times. Discussed that medications suppress the immune system and prevent the body from attacking the liver. They increase the risk of infection which may be life threatening and also increase the risk of malignancies, particularly skin cancers, lymphoma and leukemia.    Discussed with patient that transplantation carries  certain psychosocial risks including but not limited  to, depression, PTSD, generalized anxiety, feelings of guilt and anxiety regarding dependence on others.     Discussed option for HCV ab+ and NAT negative donors that patient may consider and that there is a very low chance of developing HCV infection after transplant. Patient would be monitored post transplant at regular intervals.    Discussed average length of stay in hospital after transplant, discharge clinic and lab requirements for first, second and third month. I discussed potential complications after discharge including potential infections, rejection and readmissions. Some disease states may recur after transplantation requiring specific therapy. After transplant you will be monitored closely, including being required to undergo liver biopsy when indicated to assess status of graft.     Discussed liver transplant function and importance of following medical advice, potential for rejection, liver organs accepted for transplant, donors that come with an increased risk/ High risk donors, PHS Risk manager)  ^ risk donors, donors with history of IVDU, incarceration, exchanging sex for drugs, prostitution, men who have sex with men,donors who receive significant transfusions, and donors with no known social history.  Donors who use IV drugs or engage in prostitution have the highest risk- however this risk remains less than 03/998. Informed that 15-20% of donors in the [place] are considered increased risk donors. Discussed that all potential donors have a risk of infection transmission.  Hepatitis C+ donors, Hepatitis B core positive donors, transplant candidate suitability- multi-disciplinary selection committee, and listing and de-listing notification. In addition, it was discussed with patient that he/she has the right to refuse transplantation at any time during the process.     I reviewed our latest SRTR data with them and provided printed copy. The patient and caregivers were provided an  opportunity to ask questions. Copy provided to patient. All questions answered to satisfaction.      Omar Person, NP-C  Liver Transplant NP  [phone] (office number)

## 2017-11-10 NOTE — Progress Notes (Signed)
I examined the patient, reviewed pertinent Hx, biochemistry results, imaging results and images, pertinent endoscopy results. I discussed case with Dr Theodoro Grist and I agree with his assessment and recommendations with the following added comments:    47 yo female with AAH and cirrhosis  Abstinent since JUne 2019, going to AA. Was never told she had liver disease, once she decompensated with ascites and edema and juandice she quit. Alcohol use associated with marital issues, see MSW eval.  Has responded well to a low sodium diet and current dosing of diuretics.   Mother of 5  Issues with husband drinking as well, who may not be supportive of her abstinence and goal of liver transplant  Hx of Ascites and edema and ascites.  Enlarged liver on exam, palmar erythema, spider angioma on chest, hands  Itching  No asterixis  No edema  No ascites    Denies tobacco use, other drug use.    MELD approx 25-27, Acute alcoholic hepatitis, did not respond to prednisolone. Alcoholic cirrhosis. Ansarca reponded to diuretics. Needs EV screening.     May be a good LT candidate but care giver issues may be a hurdle  Continue AA  Initiate LT eval today  including MSW consult  today    Dr Jackqulyn Livings

## 2017-11-10 NOTE — Progress Notes (Signed)
CLINIC: [facility]  ATTENDING: Marquette Saa, MD  DATE OF SERVICE: 11/10/17     [facility]  REASON FOR VISIT: Orthotopic Liver Transplant EVALUATION    Chief Complaint:   Chief Complaint   Patient presents with   . Liver Transplant Evaluation     Requesting physician: Cecelia Byars* MD  tel:[phone]    History of present illness:   Evelyn Chavez is a 47 year old female with a history of alcoholic hepatitis and cirrhosis c/b ascites requiring Lasix 40mg  - 80mg  daily (unclear) + spironolactone 200mg  daily, also requiring frequent LVPs, hepatic encephalopathy. She was seen September 12 2017 to [facility] ED with jaundice, abdominal distention. CT Abdomen showed ascites, nodular liver, splenomegaly c/w cirrhosis. She was admitted July 12-14 at [facility] requiring LVP, diagnosed with alcoholic hepatitis, and discharged on prednisone 30mg  daily, Lasix 40 and Spironolactone 100mg  daily, and then seen at [facility] 7/20 for fever. Her prednisone was discontinued, she was trated with CTX empirically ( no SBP on paracentesis). SAAG 1.7, 66 cells (17% polys), TP 0.9. She was discharged to finish a course of levofloxacin.     She is now here for liver transplantation evaluation.     She has been going to AA for twice a week. Last drink September 10, 2017.     Her last paracentesis was about 1 month ago, does not feel volume overloaded currently on her diuretics.     Liver Factors  History of previous malignancy (excluding HCC): None  Has the candidate ever had a diagnosis of HCC?: no    Liver Related Medical Factors  Previous abdominal surgery: No  Spontaneous Bacterial Peritonitis: No  History of Portal Vein Thrombosis: No  Transjugular Intrahepatic Portosystemic Shunt: No  Functional Status: Karnofsky 90% - Able to Carry on Normal Activity: Minor Symptoms of Disease    Review of systems:   Confusion: yes  Ascites: yes -- last LVP about 1 month ago  Melena:  No  Hematemesis: No  Hematochezia: No  Lower extremity edema: yes  Itching/jaundice/acholic stool: yes  All remaining systems reviewed and are negative.     Current Medications:   Current Outpatient Medications   Medication Sig   . furosemide (LASIX) 40 MG tablet TAKE 2 TABLETS BY MOUTH EVERY DAY IN THE MORNING   . hydrOXYzine HCL (ATARAX) 25 MG tablet 25mg  in the morning, afternoon, late afternoon, and 50mg  in the evening   . lactulose 10 GM/15ML solution 20 g by Oral route.   Marland Kitchen spironolactone (ALDACTONE) 100 MG tablet Take 200 mg by mouth daily.     No current facility-administered medications for this visit.        Allergies:   No Known Allergies    Updated problem list is as follows:  There are no active problems to display for this patient.      Past Medical History:  Past Medical History:   Diagnosis Date   . Cirrhosis of liver with ascites (CMS-HCC)      Active problem list was also reviewed for relevant past medical history.     Surgical History:  No past surgical history on file.  Active problem list was also reviewed for relevant past surgical history.     Social History:  Social History     Socioeconomic History   . Marital status: Not on file     Spouse name: Not on file   . Number of children: Not on file   . Years  of education: Not on file   . Highest education level: Not on file   Occupational History   . Not on file   Social Needs   . Financial resource strain: Not on file   . Food insecurity:     Worry: Not on file     Inability: Not on file   . Transportation needs:     Medical: Not on file     Non-medical: Not on file   Tobacco Use   . Smoking status: Never Smoker   . Smokeless tobacco: Never Used   Substance and Sexual Activity   . Alcohol use: Not Currently     Comment: Longstanding history of heavy drinking, especally in last 5 years   . Drug use: Not on file   . Sexual activity: Not on file   Lifestyle   . Physical activity:     Days per week: Not on file     Minutes per session: Not on file    . Stress: Not on file   Relationships   . Social connections:     Talks on phone: Not on file     Gets together: Not on file     Attends religious service: Not on file     Active member of club or organization: Not on file     Attends meetings of clubs or organizations: Not on file     Relationship status: Not on file   . Intimate partner violence:     Fear of current or ex partner: Not on file     Emotionally abused: Not on file     Physically abused: Not on file     Forced sexual activity: Not on file   Other Topics Concern   . Not on file   Social History Narrative   . Not on file       Family History:  Family History   Problem Relation Name Age of Onset   . Cancer Mother l        Physical examination:   BP 118/76   Pulse 89   Temp 98.7 F (37.1 C)   Resp 16   Ht 5\' 2"  (1.575 m)   Wt 59.5 kg (131 lb 3.2 oz)   SpO2 98%   BMI 24.00 kg/m   General: Alert and oriented x 3, and in no apparent distress. No sarcopenia.   Eyes: + scleral icterus   HENT: mucus membranes moist, no oral lesions  Lungs: Clear to auscultation bilaterally, no clubbing or cyanosis  Cardiovascular: normal sinus rhythm without significant murmur. No peripheral edema.   Abdomen: Soft, nontender abdomen with notable hepatomegaly, hernias or masses.  MSK: Normal range of motion in upper and lower extremities.  Heme: no bruising. No cervical or supraclavicular lymphadenopathy  Skin: Jaundice  Neuro: No asterixis, ambulates without assistance. [place]: [place]: Grade 0  Psych: Normal affect       Laboratory studies:   Labs were reviewed in [facility]     10/13/17  WBC 15.2, RBC 3.31, Hgb 11.6, Hct 33, MCV 100, MCH 35, MCHC 35.2, RDW 15.6, Plt 269, MPV 7.5, Segs 77, Bands 7, Lymphs 10, Monos 4, Eos 2, Baso 1    PT 18.9, INR 1.6    Glu 118, Na 132, K 4.4, Cl 92, CO2 26, BUN 16, Creat 1.2, [place] 8.9, GFR Non AA 51, GFR AA >60    ALP 230, AST 138, ALT 39, Bili 26.5, Pro 6.2, Alb 3.1  MELD-Na: 29  Childs Pugh-C    Ferritin 642, Iron 57, UIBC  47, Iron Sat 55    Serologies: 10/06/17  HAV Ab Total Neg   HAV Ab IgM Negative     HBcAb Negative  HBSAg Neg  HBsAb Negative     HCV Ab Negative    Rheum 10/06/17  ANA Positive   ANA Pattern Homogeneous  ANA Titer 1:640  AMA Ab Negative   ASMAb Negative    Ceruloplasmin 32  A1AT Ph M1M1, 261    Liver Kidney Microsome Ab IgG <1:20    Diagnostic studies:   I have reviewed all pertinent imaging and procedures in EPIC    Imaging Studies:  I have personally reviewed the imaging reports.      Assessment and plan:   Evelyn Chavez is a 47 year old female with a history of decompensated ETOH cirrhosis/alcoholic hepatitis c/b HE on lactulose, ascites on diuretics, has required LVPs but none in the past 2 months--last drink September 10 2017. She failed a 7-10 day trial of prednisolone at outside hospital.     # Alcoholic Cirrhosis/Hepatitis referred for OLT evaluation  - MELD Na - 29 in 10/2017, as she continues abstinence, she may have improvement in her decompensation status and/or MELD but her MELD has not dropped precipitously.   - Ascites:  Lasix 40-80?mg/Aldactone 200mg , SBP: no hx => continue per Dr. Lavella Hammock   - Hepatic Encephalopathy: Grade 0-1, continue lactulose titrate to 3BMs per day  - Esophageal Varices:  EGD scheduled next week with Dr. Lavella Hammock  - [facility] screening: will get cross-sectional imaging, AFP  - OLT candidacy: ongoing   - Serologies: CMV IgG/IgM, EBV IgG/IgM, HSV I/II IgG, HIV, RPR   - PeTH, Urine Tox   - complete Phase I Labs   - TTE   - Pap Smear   - obtain mammogram records from 04/2017      - Vasculature: will order need cross-sectional imaging      - see accompanying NP Manzano's note for information on pending work-up   A copy of this consultation letter was faxed/routed to Dr. Lavella Hammock.    It is a pleasure to participate in the care of this patient with you. If you have any questions, please do not hesitate to contact me.

## 2017-11-10 NOTE — Patient Instructions (Addendum)
1. Continue with AA, three times a week sounds great. Glad you're getting a lot out of it.     2. Please sign up for mychart so if you have any updates on medications you can send it our way through mychart.     3. Keep all future appointments.     4. We will ask Dr. Ranae Plumber to order repeat MELD labs (CMP, PT/INR)  We will also request for a blood type.     5. Start women's multivitamin    Nutrition:  * High Protein Diet: Malnutriton is found in many patients with cirrhosis. Patients with cirrhosis often break down a lot of their muscle at night. Therefore, daily protein intake, including a daily high protein bedtime snack, is very important. Examples of protein rich foods: unsalted nuts, beans, fish, chicken, eggs or egg whites, yogurt (Greek yogurt has 2x the protein in regular yogurt). You can also have beef or pork in moderation. Nutritional supplements like ensure, boost, nepro, carnation instant breakfast are also very helpful. You can also buy WHEY protein powder (but make sure this is simple protein from the grocery store without any creatine/amino acid/or other special additives) If you want to do the math: goal protein intake is 1.2 to 1.5 g/kg/day    * Low salt diet (no more than 2000mg  per day): Processed foods have the most sodium. These foods usually come in cans, boxes, jars, and bags. They can have lots of salt even if they don't taste salty. Here are some examples of foods that often have too much sodium:   - Canned soups  - Rice and noodle mixes, Ramen noodles  - Sauces, dressings, and condiments (such as ketchup and mustard, soy sauce, fish sauce)  - Pre-made frozen meals (also called "TV dinners")  - Deli meats, hot dogs, hamburgers  - Smoked, cured, or pickled foods  - Restaurant meals   We recommend eating only home cooked meals. If you do eat anything that is not fresh, make sure to read the label to check the sodium content. Most things that say "low salt" have a lot of salt.

## 2017-11-11 ENCOUNTER — Encounter (INDEPENDENT_AMBULATORY_CARE_PROVIDER_SITE_OTHER): Payer: Self-pay | Admitting: Gastroenterology

## 2017-11-12 ENCOUNTER — Telehealth (INDEPENDENT_AMBULATORY_CARE_PROVIDER_SITE_OTHER): Payer: Self-pay

## 2017-11-12 NOTE — Telephone Encounter (Signed)
Pt would like a call back regarding lab instructions please call back at [phone]

## 2017-11-13 NOTE — Telephone Encounter (Signed)
Returned patient's call. No answer. Left message to answer patient's question.     Advise to give to her Gastroenterologist through [facility] or PCP. Provided Dalia the lab requistion to also fax to [place], [place] for Dr. Lavella Hammock.     Fax [phone]    Phone [phone]

## 2017-11-18 ENCOUNTER — Telehealth (INDEPENDENT_AMBULATORY_CARE_PROVIDER_SITE_OTHER): Payer: Self-pay

## 2017-11-18 DIAGNOSIS — K7031 Alcoholic cirrhosis of liver with ascites: Principal | ICD-10-CM

## 2017-11-18 NOTE — Telephone Encounter (Signed)
Patient scheduled with Garth Schlatter, scheduling team for 12/17/17.
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
## 2017-11-18 NOTE — Telephone Encounter (Signed)
Pt needs to see neuropsych as part of transplant work up.    Moise Boring, LCSW

## 2017-11-18 NOTE — Telephone Encounter (Signed)
Order placed for neuropsych consult.

## 2017-11-20 ENCOUNTER — Encounter (INDEPENDENT_AMBULATORY_CARE_PROVIDER_SITE_OTHER): Payer: Self-pay | Admitting: Nurse Practitioner

## 2017-11-20 NOTE — Progress Notes (Signed)
Patient records were obtained from: Dr. Lavella HammockPolitoske    Approximately 15 minutes of non face-to-face time was spent in review of patient's clinical course to date, labs, imaging studies, procedures, surgical interventions, specialty consultations and the findings are summarized below:    Procedures:  EGD 11/19/17  Postoperative Diagnosis  1. Esophagitis  2. Portal Hypertensive Gastropathy  3. Enlarged prepyloric fold

## 2017-11-24 ENCOUNTER — Encounter (INDEPENDENT_AMBULATORY_CARE_PROVIDER_SITE_OTHER): Payer: Self-pay

## 2017-11-24 ENCOUNTER — Encounter (INDEPENDENT_AMBULATORY_CARE_PROVIDER_SITE_OTHER): Payer: Self-pay | Admitting: Nurse Practitioner

## 2017-11-24 NOTE — Telephone Encounter (Signed)
Re-Faxed office visit note to Dr. Timmie FoersterPolitoske's office with list of what is needed to complete for evaluation.

## 2017-11-25 ENCOUNTER — Encounter (INDEPENDENT_AMBULATORY_CARE_PROVIDER_SITE_OTHER): Payer: Self-pay

## 2017-11-25 ENCOUNTER — Encounter (INDEPENDENT_AMBULATORY_CARE_PROVIDER_SITE_OTHER): Payer: Self-pay | Admitting: Nurse Practitioner

## 2017-11-25 NOTE — Progress Notes (Signed)
Patient records were obtained from: [facility]    Approximately 30 minutes of non face-to-face time was spent in review of patient's clinical course to date, labs, imaging studies, procedures, surgical interventions, specialty consultations and the findings are summarized below:        Pertinent Imaging:  Mammogram of the Left Breast 06/23/17  BI-RADS Assessment: Negative  Recommendation: routine screening mammogram in 1 year.

## 2017-11-25 NOTE — Progress Notes (Signed)
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  Pt came to clinic today accompanied by her mother.  This LCSW provided education related to pre and post transplant work-up, care expectations, sobriety requirements, medication management, and relocation requirements.  Pt's mother appeared knowledgeable about pt's issues with addiction and is supportive of her sobriety.  She currently lives in [place], but plans to relocate at the time of transplant to stay with pt.  She also stated that she would be willing to relocate prior to transplant if needed to help pt.  She is retired, can drive and is in good health.  She stated that pt's father is also interested in assist as needed.    Pt turned in all ppw.    Pt has not yet gotten labs completed or enrolled in therapy.  She appeared to have some misunderstanding about how to prioritize her multiple appts.  She stated it is confusing working within the multiple health systems ([facility]).  NP Manzano met with pt to provide education and guidance.  Offered reeducation today regarding importance of enrolling in therapy and getting labs.  She continues to attend AA and turned in additional logs.  She also remains connected with her sponsor. She denied any alcohol use.    Recommendations:    1. Pt needs to attend therapy to boost positive defenses, coping skills, and other strengths to support abstinence from substances  2. Pt needs to attend AA support to maintain abstinence from substances three times weekly for at least six months and bring documentation to clinic   3. Pt should undergo PEth,nicotine, and UDS.  4. Pt needs to meet with neuropsychologist    Trevor Iha,  [place]

## 2017-11-29 ENCOUNTER — Encounter (INDEPENDENT_AMBULATORY_CARE_PROVIDER_SITE_OTHER): Payer: Self-pay | Admitting: Nurse Practitioner

## 2017-11-30 ENCOUNTER — Encounter (INDEPENDENT_AMBULATORY_CARE_PROVIDER_SITE_OTHER): Payer: Self-pay | Admitting: Hospital

## 2017-11-30 ENCOUNTER — Encounter (INDEPENDENT_AMBULATORY_CARE_PROVIDER_SITE_OTHER): Payer: PRIVATE HEALTH INSURANCE

## 2017-11-30 ENCOUNTER — Telehealth (INDEPENDENT_AMBULATORY_CARE_PROVIDER_SITE_OTHER): Payer: Self-pay

## 2017-11-30 MED ORDER — FUROSEMIDE 40 MG OR TABS
20.00 mg | ORAL_TABLET | Freq: Every day | ORAL | 3 refills | Status: DC
Start: 2017-11-30 — End: 2018-12-20

## 2017-11-30 MED ORDER — SPIRONOLACTONE 100 MG OR TABS
50.00 mg | ORAL_TABLET | Freq: Every day | ORAL | 3 refills | Status: DC
Start: 2017-11-30 — End: 2018-12-20

## 2017-11-30 NOTE — Telephone Encounter (Signed)
[facility] today for patients clinical summary workup. PENDING

## 2017-11-30 NOTE — Telephone Encounter (Signed)
Patient's medication list updated:    Lasix 20mg  daily  Aldactone 50mg  daily

## 2017-11-30 NOTE — Telephone Encounter (Signed)
From: Francie MassingMelissa Heckman  To: Verdie DrownManzano, Renelle Stegenga O, NP  Sent: 11/29/2017 7:46 AM PDT  Subject: Mardee Postin20-Other    Hi Yakelin Grenier,    Just wanted to give you an update on my medications that have changed.    20 mg Furosemide 1x/day  50 mg Spironolactone 1x/day    Ludia

## 2017-11-30 NOTE — Telephone Encounter (Signed)
From: Evelyn Chavez  To: Ladon Applebaum Franz Dell, LCSW  Sent: 11/30/2017 9:03 AM PDT  Subject: Nevin Bloodgood Morning Lorriane Shire,  Last week when I met with you and Zella Ball, I wrote down in my Calendar that there was a Liver 101 class today, however when I confirmed the time, the receptionist said there was not one today, but on Wednesday.    My Dad drove in from [place], [place] yesterday to attend for today. Can you confirm that there is not one Today,  Monday Set. 23? and if so, is there a way you can meet with [place] like you did with my mom. It would not have to be as long as when my Mom was here, but since he made the trip out here, I was hoping that he could get some answers.    Thanks for your time

## 2017-12-01 ENCOUNTER — Encounter (INDEPENDENT_AMBULATORY_CARE_PROVIDER_SITE_OTHER): Payer: Self-pay | Admitting: Nurse Practitioner

## 2017-12-01 NOTE — Progress Notes (Signed)
Patient records were obtained from: [facility]    Approximately 10 minutes of non face-to-face time was spent in review of patient's clinical course to date, labs, imaging studies, procedures, surgical interventions, specialty consultations and the findings are summarized below:    Pertinent Labs:    11/26/17  WBC 4.6, RBC 3.45, Hgb 10.6, Hct 30.6, MCV 89, MCH 30.7, MCHC 34.6, RDW 12, Plt 225, Neutros 54, lymphs 35, Monos 9, Eos 2, Basos 0    Glu 135, BUN 20, Creat 1.42, GFR-Non AA 44, GFR AA 50, Na 138, K 4.7, Cl 95, CO2 19, Arthur 9.9, Pro 7.6, Alb 4, Tbil 1.1, ALP 100, AST 47, ALT 25         INR 1.2, PT 12.4    ABO A  Rh Pos    RPR Nonreactive       AFP will follow, L3% Will follow   DCP Will follow  HSV 1/2 PCR: Will follow  CMV IgG/IgM Will follow  EBV Will follow  Immunoglobulins Will follow      MELD-Na 17 (significant improvement since last labs we have, MELD-Na 29)

## 2017-12-02 ENCOUNTER — Telehealth (INDEPENDENT_AMBULATORY_CARE_PROVIDER_SITE_OTHER): Payer: Self-pay

## 2017-12-02 NOTE — Telephone Encounter (Signed)
Dalia.Marland KitchenMarland KitchenI have a few questions that Lambert Mody is asking me in regards to Francie Massing #[id number].      1. On the Letter it's asking for a surgical consultation [phone] however I requested a Liver Transplant Consult - is this different from the consult they already had?     2. The patient has been going to [facility] in [place] for Labs and they want to continue to go there.     3. Chest xray and abdominal cat scan will be done by [facility] imaging    4. Dr. Doroteo Glassman has referred the patient to Dr. Barbera Setters (not sure of spelling) for echo.     5.  The only outstanding test is the ekg...do you want [facility] to perform?      I am going to put this request in a encounter.  However, can you respond to Skype as well because Lambert Mody is waiting on my response.  Thank you.

## 2017-12-03 ENCOUNTER — Encounter (INDEPENDENT_AMBULATORY_CARE_PROVIDER_SITE_OTHER): Payer: Self-pay | Admitting: Nurse Practitioner

## 2017-12-03 NOTE — Telephone Encounter (Signed)
Called Bonita Quin, Case Manger from [place] to answer questions she may have regarding this patient. There was no answer as working hours are from 7a-4p. Left message to answer the questions she has regarding [facility] labs and that we will [facility]. Also, EKG ordered as part of the transplant eval despite ECHO.

## 2017-12-04 ENCOUNTER — Telehealth (INDEPENDENT_AMBULATORY_CARE_PROVIDER_SITE_OTHER): Payer: Self-pay

## 2017-12-04 NOTE — Telephone Encounter (Signed)
12/04/2017 RCC.Marland KitchenMarland KitchenReceived approval authorization #[id number] for DOS 12/04/2017 - 02/06/2018 for [facility] for abdominal CAT Scan and chest x ray.   Scan to file

## 2017-12-04 NOTE — Telephone Encounter (Signed)
12/04/2017 RCC.Marland Kitchen Rcvd notice of authorization #[id number] from [facility] for Liver  Transplant Surgical Evaluation for DOS 12/01/2017 - 12/31/2017. Scan to Media

## 2017-12-07 NOTE — Telephone Encounter (Signed)
Patient is returning missed call. Please f/u.

## 2017-12-07 NOTE — Telephone Encounter (Addendum)
Called to inform patient- Left VM with CB information; Gave mainline number.

## 2017-12-08 ENCOUNTER — Encounter (INDEPENDENT_AMBULATORY_CARE_PROVIDER_SITE_OTHER): Payer: Self-pay | Admitting: Nurse Practitioner

## 2017-12-08 NOTE — Progress Notes (Signed)
Patient records were obtained from: [facility]  (Dr. Timmie Foerster office)     Approximately 10 minutes of non face-to-face time was spent in review of patient's clinical course to date, labs, imaging studies, procedures, surgical interventions, specialty consultations and the findings are summarized below:    Pertinent Labs:  11/26/17  IgM 316, IgA 591, IgG 1732    11/26/17   ABO A Positive  Serologies 11/26/17  HIV 4th Gen Nonreactive   RPR Nonreactive   CMV IgG Positive, CMV IgM Positive    11/26/17    Glu 135, BUN 20, Creat 1.42, GFR AA 50, GFR NonAA 44, Na 138, K 4.7, Cl 95, CO2 19, [place] 9.9, Pro 7.6, Alb 4, Bilirubin 3.2, ALP 100, AST 47, ALT 25    WBC 4.6, RBC 3.45, Hgb 10.6, Hct 30.6, MCV 89, MCH 30.7, MCHC 34.6, RDW 12, Plt 225, Neuts 54, Lymphs 35, Monos 9, Eos 2, Basos 0     PT 12.4, INR 1.2    MELD-Na 17

## 2017-12-09 ENCOUNTER — Telehealth (INDEPENDENT_AMBULATORY_CARE_PROVIDER_SITE_OTHER): Payer: Self-pay

## 2017-12-09 NOTE — Telephone Encounter (Signed)
10.2.2019 rcvd auth from [place] [facility] follow up visit - Auth #[id number] DOS 12/04/2017 - 02/03/2018. Scan to file

## 2017-12-09 NOTE — Telephone Encounter (Signed)
Spoke with Bonita Quin case Production designer, theatre/television/film  [phone] and reviewed labs and transplant eval. She was also inquiring if appointment with Dr. Jackqulyn Livings was necessary. Explained to her it was seeing her MELD score has continued to come down. Agreed that patient can get labs at [facility] if that is preferred, however if patient needs to go to [facility] that is acceptable as well.

## 2017-12-09 NOTE — Telephone Encounter (Signed)
Called patient and inquired if she had any other missed calls from our office. Informed her this NP did not call her the last few days however happy to speak with patient now. Informed her MELD 17 from most recent labs. Also inquired where she was in scheduling the CT abd and CXR. She informed this NP she feels she had a MRI w/wo contrast at [facility] and see if this is adequate, if not, she is happy to schedule CT Abd. Patient also reports having a CXR however happy to get this repeated if needed. She confirms her appointment with Dr. Jackqulyn Livings and confirmed her parents both attended the liver transplant 101 class. She has not other questions at this time.

## 2017-12-14 ENCOUNTER — Other Ambulatory Visit: Payer: Self-pay

## 2017-12-16 ENCOUNTER — Encounter (INDEPENDENT_AMBULATORY_CARE_PROVIDER_SITE_OTHER): Payer: Self-pay | Admitting: Nurse Practitioner

## 2017-12-16 NOTE — Progress Notes (Signed)
Patient records were obtained from: [facility]    Approximately 5 minutes of non face-to-face time was spent in review of patient's clinical course to date, labs, imaging studies, procedures, surgical interventions, specialty consultations and the findings are summarized below:        Pertinent Imaging:  CT Abd/ Pelv W/ Contrast 09/16/17    Impression:  Heterogeneous, enlarged cirrhotic liver with possible multiple small low attenuating lesions which may reflect regenerative nodules. Manifestations of portal hypertension including splenomegaly, varices and small volume ascites.     Mild generalized small bowel wall thickening may be secondary to hypoproteinemia versus enteritis.     Normal appendix.     Colonic Diverticulosis      Abdominal U/S 09/15/17  Impression:  Right-sided ascites  Mild hepatomegaly with heterogeneous hepatic echotexture. Correlate with liver function tests.

## 2017-12-22 ENCOUNTER — Encounter (INDEPENDENT_AMBULATORY_CARE_PROVIDER_SITE_OTHER): Payer: Self-pay | Admitting: Gastroenterology

## 2017-12-22 ENCOUNTER — Ambulatory Visit (INDEPENDENT_AMBULATORY_CARE_PROVIDER_SITE_OTHER): Payer: PRIVATE HEALTH INSURANCE | Admitting: Gastroenterology

## 2017-12-22 VITALS — BP 118/73 | HR 67 | Temp 97.9°F | Resp 16 | Ht 62.0 in | Wt 135.6 lb

## 2017-12-22 DIAGNOSIS — K7030 Alcoholic cirrhosis of liver without ascites: Secondary | ICD-10-CM

## 2017-12-22 DIAGNOSIS — F1021 Alcohol dependence, in remission: Principal | ICD-10-CM

## 2017-12-22 MED ORDER — BACLOFEN 10 MG OR TABS
10.0000 mg | ORAL_TABLET | Freq: Three times a day (TID) | ORAL | 3 refills | Status: DC
Start: 2017-12-22 — End: 2018-03-30

## 2017-12-22 NOTE — Patient Instructions (Addendum)
1. Please pursue obtaining the CT abdomen w/wo contrast. Your imaging was not a multiphase.     2. Continue with abstinence and follow through with recommendations from your Liver Transplant Social Worker    3. Things that need to be done:  CT Abd w/wo Contrast; bilateral MAMMO; ECHO; Surgical Eval; We will need CMP, PT/INR, CBC w/ Diff monthly ( this month, November, and December)       4. Nutrition:  * High Protein Diet: Malnutriton is found in many patients with cirrhosis. Patients with cirrhosis often break down a lot of their muscle at night. Therefore, daily protein intake, including a daily high protein bedtime snack, is very important. Examples of protein rich foods: unsalted nuts, beans, fish, chicken, eggs or egg whites, yogurt (Greek yogurt has 2x the protein in regular yogurt). You can also have beef or pork in moderation. Nutritional supplements like ensure, boost, nepro, carnation instant breakfast are also very helpful. You can also buy WHEY protein powder (but make sure this is simple protein from the grocery store without any creatine/amino acid/or other special additives) If you want to do the math: goal protein intake is 1.2 to 1.5 g/kg/day    * Low salt diet (no more than 2000mg  per day): Processed foods have the most sodium. These foods usually come in cans, boxes, jars, and bags. They can have lots of salt even if they don't taste salty. Here are some examples of foods that often have too much sodium:   - Canned soups  - Rice and noodle mixes, Ramen noodles  - Sauces, dressings, and condiments (such as ketchup and mustard, soy sauce, fish sauce)  - Pre-made frozen meals (also called "TV dinners")  - Deli meats, hot dogs, hamburgers  - Smoked, cured, or pickled foods  - Restaurant meals   We recommend eating only home cooked meals. If you do eat anything that is not fresh, make sure to read the label to check the sodium content. Most things that say "low salt" have a lot of salt.

## 2017-12-22 NOTE — Progress Notes (Signed)
CLINIC: [facility]  ATTENDING: Marquette Saa M.D.  DATE OF SERVICE: 12/22/2017    [facility]    REASON FOR VISIT: ALCOHOLIC CIRRHOSIS, LIVER TRANSPLANT CANDIDAICY     Family History   Problem Relation Name Age of Onset   . Cancer Mother l      Past Medical History:   Diagnosis Date   . Cirrhosis of liver with ascites (CMS-HCC)        Current Outpatient Medications   Medication Sig Dispense Refill   . baclofen (LIORESAL) 10 MG tablet Take 1 tablet (10 mg) by mouth 3 times daily. 90 tablet 3   . furosemide (LASIX) 40 MG tablet Take 0.5 tablets (20 mg) by mouth daily.  3   . hydrOXYzine HCL (ATARAX) 25 MG tablet 25mg  in the morning, afternoon, late afternoon, and 50mg  in the evening 30 tablet 3   . lactulose 10 GM/15ML solution 20 g by Oral route.     Marland Kitchen spironolactone (ALDACTONE) 100 MG tablet Take 0.5 tablets (50 mg) by mouth daily. 30 tablet 3     No current facility-administered medications for this visit.        ALLERGIES: No Known Allergies      LABS: Labs were reviewed.  Pertinent Labs:  11/26/17  IgM 316, IgA 591, IgG 1732    11/26/17   ABO A Positive  Serologies 11/26/17  HIV 4th Gen Nonreactive   RPR Nonreactive   CMV IgG Positive, CMV IgM Positive    11/26/17    Glu 135, BUN 20, Creat 1.42, GFR AA 50, GFR NonAA 44, Na 138, K 4.7, Cl 95, CO2 19, [place] 9.9, Pro 7.6, Alb 4, Bilirubin 3.2, ALP 100, AST 47, ALT 25    WBC 4.6, RBC 3.45, Hgb 10.6, Hct 30.6, MCV 89, MCH 30.7, MCHC 34.6, RDW 12, Plt 225, Neuts 54, Lymphs 35, Monos 9, Eos 2, Basos 0     PT 12.4, INR 1.2    MELD-Na 17    Imaging Studies:      Endoscopy Reports:    LIVER SPECIFIC SIGNS AND SYMPTOMS:  Confusion: No  Abdominal distention: No  Melena: No  Hematemesis: No  Hematochezia: No  Lower extremity edema: No    REVIEW OF SYSTEMS:    Constitutional: feels well   HEENT: denies   Cardiovascular: denies   Respiratory:denies   Gastrointestinal:denies   Genitourinary: denies   Musculoskeletal: denies   Skin:  denies  Neurological: denies   Psychiatric:denies   Endocrine: denies   Hematologic: denies     All other systems negative.      HISTORY: Evelyn Chavez is a 47 year old @GENDER @     FIrst seen on 11/10/17:  Evelyn Chavez, Evelyn Larve, MD   Attending Physician   Hepatology   Progress Notes   Signed   Encounter Date:  11/10/2017                    [] Hide copied text    [] Hover for details  I examined the patient, reviewed pertinent Hx, biochemistry results, imaging results and images, pertinent endoscopy results. I discussed case with Dr Theodoro Grist and I agree with his assessment and recommendations with the following added comments:    47 yo female with AAH and cirrhosis  Abstinent since JUne 2019, going to AA. Was never told she had liver disease, once she decompensated with ascites and edema and juandice she quit. Alcohol use associated with marital issues, see MSW eval.  Has responded well to a low sodium diet and current dosing of diuretics.   Mother of 5  Issues with husband drinking as well, who may not be supportive of her abstinence and goal of liver transplant  Hx of Ascites and edema and ascites.  Enlarged liver on exam, palmar erythema, spider angioma on chest, hands  Itching  No asterixis  No edema  No ascites    Denies tobacco use, other drug use.    MELD approx 25-27, Acute alcoholic hepatitis, did not respond to prednisolone. Alcoholic cirrhosis. Ansarca reponded to diuretics. Needs EV screening.     May be a good LT candidate but care giver issues may be a hurdle  Continue AA  Initiate LT eval today  including MSW consult  today    Dr Evelyn Chavez        Electronically signed by Beatris Si, MD at 11/11/2017 7:31 PM      12/22/17  INTERVAL HISTORY: . In the interim the patient has not required hospitalization or emergency care.     Here with her mother and father (divroced) who came in from [place]. Husband is drinking less at home but not really supportive of the patient nor of their 5 children, the  oldest of which, 45 yo, is having a manic flair of her severe bipolar disorder, and was recently briefly hospitalised. Daughter having manic episode  Patient is very stressed, understandably and havig Having cravings    Despite the above she has remained abstinent from alcohol use and is feeling much better than 3 months prior. Her transaminases have declined; her TB and her MELD has declined from 27 to 19. I suspect her abstinence has led to significant improvements in liver health.  IN another 4m her MLED score may well deceline below liver transplant citeria.     PLAN:   1) needs Echo, PAP, and CT abdomen 4 phase for [facility] surveilllance  2) f/u with Evelyn Chavez [facility] tomorrow for Psych eval  3) Baclofen 10mg  TID for alcohol cravings  4) To f/u with MSW today.    spent in coordinating care for this patient, >50% spent in face-to-face counseling for the above issues. All questions answered and explained to patient's satisfaction.   Dr Evelyn Chavez

## 2017-12-22 NOTE — Progress Notes (Signed)
Plan:  -- communicated with patient regarding pending transplant eval: ECHO 2-D, CT Abd w/wo contrast (quad phase), Mammogram (familial hx of breast cancer-pt's paternal grandmother and paternal aunt), PAP report (done last week)   -- per patient, received flu shot from PCP office last week and is up to date

## 2017-12-23 ENCOUNTER — Ambulatory Visit (INDEPENDENT_AMBULATORY_CARE_PROVIDER_SITE_OTHER): Payer: PRIVATE HEALTH INSURANCE | Admitting: Psychology

## 2017-12-23 ENCOUNTER — Encounter (INDEPENDENT_AMBULATORY_CARE_PROVIDER_SITE_OTHER): Payer: Self-pay | Admitting: Nurse Practitioner

## 2017-12-23 DIAGNOSIS — K7031 Alcoholic cirrhosis of liver with ascites: Secondary | ICD-10-CM

## 2017-12-23 NOTE — Progress Notes (Signed)
Patient needs to get CT abd and chest XRay at [facility]  F: [phone]    Spoke with Bonita Quin, Case Manager who reports that [facility] needs an order to get patient scheduled. Inquired about ECHO and EKG and reports the Dr. Nadara Mode (cards) needs order for echo and ekg. Bonita Quin will communicate with Dr. Timmie Foerster office.

## 2017-12-23 NOTE — Interdisciplinary (Signed)
Initial Liver Transplant Nutrition Evaluation    Assessment:  Pt is a 47 year old female who presents today for evaluation of candidacy for liver transplant.  PMH significant for: ETOH cirrhosis    Anthropometrics:  Height: 5\' 2"  (157.5 cm)  Weight: 61.5 kg (135 lb 9.6 oz)   Body mass index is 24.8 kg/m.    Wt change: +4 lb in 5 weeks  % Weight change: 3% change which is insignificant    Laboratory Data:  (11/26/17)  Glu 135, Alb 4.0, K+ 4.7, Na 138, TBilli 3.2    Vitamins/Oral Supplements:  n/a     Medications: baclofen, lasix, atarax, lactulose, aldactone     Nutrition-Focused Physical Findings   Chewing/Swallowing: denies chewing/swallowing difficulties  GI: denies nausea, vomiting, diarrhea, constipation or reflux   Skin: intact     Malnutrition Assessment:  Insufficient Energy Intake:  Y  Weight loss:   N   Loss of Muscle Mass:  N   Loss of subcutaneous fat: N    Ascites/Edema:  N  Handgrip Strength: n/a  Conclusion: Well-nourished, mildly malnourished and is at risk should minimal intake continue    Malnutrition Physical Exam:   Subcutaneous Fat Loss: Orbital Region: none Triceps/Biceps:  none    Muscle Loss: Temporalis: mild Clavicle Bone Region: mild  Acromion Bone: none  Scapular Bone Region: none  Handgrip Strength: n/a  Fluid Accumulation: none    Food/Nutrition-Related History  Diet Rx: Low Na diet  PO Intake: Patient reports fair appetite although will often go most of the day without eating only because she does not think about it.  She has significant issues at home with her family which preoccupies her time.  Patient was encouraged to set an alarm on her phone for 3 times daily to remind her to have a nutrient dense snack or meal.  Patient agreed.  When patient does have meals she is watching her sodium intake and increasing her protein intake.  She has been having a Premier Protein shake daily and sometimes twice. She has also been using Pure Protein bars as a snack if she remembers.  Patient  reports an improvement in her appetite overall, however being preoccupied throughout the day is interfering with consistent meal intake.     Typical Daily Food Intake   Food Allergies:   Breakfast: Premier Protein shake  Snack:   Lunch: none  Snack:   Dinner: small piece of lean meat with veggies and sometimes a starch  Snack:     Daily Activity:   Household chores, running errands although no planned activity    Estimated Needs  Calories:   1845-2135 kcal/day     (30-35kcal/kg ABW)  Protein:         74-92 g/day  (1.2-1.5g/kg protein)    Nutrition Diagnostic Statement: Imbalance of nutrients related to food and nutrition throughout the day as evidenced by less than ideal intake of energy as evidenced by patient self-report.      Nutrition Intervention  High Protein/Low Sodium diet  Small frequent nutrient dense meals/snacks  Premier Protein BID/TID  Set timer for meal/snack reminder    Nutrition Education:   Educated patient and family on importance of nutrition throughout all transplant phases. High protein/low sodium diet was discussed in detail with importance of adequate calorie intake for optimal nutrition status throughout pre-transplant phase. Immediate post-transplant diet education was also reviewed as potential for elevated blood sugars, need for enteral feeding, and unintentional weight loss is anticipated.  Written educational materials: Pre-Liver Transplant Nutrition    Monitoring and Evaluation:  Promote maintenance of lean body mass  PO intakes to meet >75% of estimated energy needs   Verbalize understanding of diet education      Patient is nutritionally adequate for Liver Transplant at this time.     Gavin Pound, RD  Liver Transplant Dietitian   Transplant RD to follow up prn or per referral  Provided patient with RD contact information and encouraged to call with any questions

## 2017-12-23 NOTE — Progress Notes (Signed)
Orders faxed to [facility]

## 2017-12-25 NOTE — Interdisciplinary (Signed)
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  Pt came to clinic today accompanied by her mother and her father. The patient continues to attend AA three times per week (up until this week as she has been dealing with an acute psychiatric crisis with her 17y.o. daughter). However, she reports that she has daily phone conversations with her AA sponsor which has also been helpful. She admits that she still has cravings for alcohol, especially with her family stressors. She remains committed to her sobriety as she wants to be available for her family and she is happy to learn her MELD score has decreased from 29 to 17 with her sobriety. She understands that if she continues to remain sober her MELD may continue to decrease and she may never need a transplant, which would be ideal for her.     The patient does state that she went for an initial evaluation with a therapist. The therapist recommended an IOP program for her. While the patient feels she would benefit from an IOP, she cannot commit to it at this time. Her 17y.o. daughter has Bipolar disorder that is not managed and she had to take her to the emergency room for psychiatric help twice in the last week. The patient states that she will continue to see the therapist, and will consider the IOP in the future if her daughter's mental health crisis resolves. Provided the patient with some resources for her daughter as well as managing her daughter's health will also help decrease her stress.     Her parents remain engaged in her care. They are also trying to assist with child care for the patient's children. She is coping as best as she can with her difficult  psychosocial stressors at the moment. She has maintained sobriety through this, which is very good.     Recommendations:    1. Pt needsto continue to attend therapy to boost positive defenses, coping skills, and other strengths to support abstinence from substances. Will obtain a ROI to collaborate care with her therapist.   2. Pt needsto attend AA support to maintain abstinence from substances three times weekly for at least six months and bring documentation to clinic  3. Pt should undergo PEth,nicotine, and UDS.  4. Pt needsto meet with neuropsychologist - Her appointment is scheduled for 12/23/17.

## 2017-12-30 ENCOUNTER — Telehealth (INDEPENDENT_AMBULATORY_CARE_PROVIDER_SITE_OTHER): Payer: Self-pay

## 2017-12-30 NOTE — Telephone Encounter (Signed)
Called Dr.Politoske Office [phone] to request pending labs (PETH/AFD) but they said they had to do them again yesterday 12/29/17. Will call back to get lab results. Thank you.

## 2017-12-31 ENCOUNTER — Telehealth (INDEPENDENT_AMBULATORY_CARE_PROVIDER_SITE_OTHER): Payer: Self-pay | Admitting: Nurse Practitioner

## 2017-12-31 NOTE — Telephone Encounter (Signed)
Records received from [facility]    CT Abd w/wo Contrast 12/31/17 [facility]    Heterogeneous liver without mass. Recanalization of the paraumbilical vein reflective of portal venous hypertension. The PV is patent. No LI-Rads observations.   Conclusion   1. Multiple secondary findings of protal venous hypertension reflective of chronic liver disease including splenomegaly, scattered varices along with recanalization of the paraumbilical vein. No mass  2. Cholelithiasis.

## 2018-01-01 ENCOUNTER — Other Ambulatory Visit: Payer: Self-pay

## 2018-01-01 NOTE — Progress Notes (Signed)
PSYCHIATRY PRE-TRANSPLANT EVALUATION  COGNITIVE MEDICINE GROUP                                           Patient: Evelyn Chavez Referring:   Clement Husbands, M.D.   MRN #: [id number] DOS:                         12/23/2017   Age: 47 DOB: 02/23/1971         REASON FOR REFERRAL AND BRIEF HISTORY:   Clement Husbands, M.D. referred this 36 year old woman for a pre-transplant liver evaluation. Ms. 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 She reported that she found out about her liver disease in June 2019, when she decompensated with ascites (s/p paracentesis), jaundice, and edema. She reportedly stopped drinking 09/11/17. Per chart review, her exam showed an enlarged liver, palmar erythema, and spider angioma on her chest and hands. Her ESLD is complicated by transaminitis, coagulopathy, leukocytosis, and hypervolemic hyponatremia. Her recent MELD (12/22/17) decreased from 29 to 17 with her sobriety, and she was happy to learn that continued sobriety and reductions in her MELD score may mean she might never need a transplant.    Ms. Mathias reported doing ADLs unassisted. She reported no problems with driving or performing household chores. Her mother has helped with managing her finances after her husband separated their finances several months ago. She has applied for Social Security benefits given her inability to work with her many medical appointments. She reported no missed medications or appointments. She is taking lactulose as directed and reported 2 bowel movements per day.     Social: Ms. Settlemyre is married and lives with her husband of 20 years, Legrand Como, in [place]. They are separating, but continuing to live in the same house. They have five children (three girls and two boys), ages 65-21. Ms. Brockwell mother (Gene Sabra Heck) is planning to relocate from [place] and live in Ms. Underwood's backyard in a motor home while being her primary caregiver. Ms. Grose father is also planning to temporarily  relocate from [place] and live in a hotel nearby, to be Ms. Muller's secondary caregiver. Ms. Doby reported that they are both in good health and can drive and take her to appointments as needed. They have also been trying to assist with childcare for her children.  .  Education and Occupation: Ms. Tullo has an Associate's degree. 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 She is unemployed.     Psychiatric: Ms. Fite reported that the decline in her marriage has caused her stress and depression. She stated that her husband separated their finances without her knowledge several months ago and left her with very little money. Her mother has been paying all of her bills. She was open to participating in therapy and stated that she recently did an intake evaluation with a therapist, who recommended she attended an IOP. Ms. Truby acknowledged that she feels she would benefit from an IOP but cannot commit to it at this time due to several psychosocial stressors. She explained that her 74 year old daughter has bipolar disorder, required psychiatric help at the ED last week, and cannot currently be left alone. We discussed how it is in moments of crises such as these when  support and therapy are most helpful, and Ms. Tarpley was encouraged to lean on her parents, attend groups, talk to her sponsor, and meet with a therapist as soon as possible. Ms. Lindquist expressed understanding. Ms. Thornberry has not been psychiatrically hospitalized, and she denied SI/SA/HI/AVH.  She has no prior psychological treatment and she denied prior episodes of depression, anxiety, mania, or psychosis.     Substance Use: Ms. Pates reported that she began drinking daily at the age of 79 and stated that for the last 15 years she would drink vodka, wine, and hard lemonade throughout the day. She noted that it was difficult for her to estimate how  many drinks she was having per day before she quit, though she estimated 6-10 drinks per day at her Social Work evaluation. She reported that she has attended 3 AA groups per week since October 07, 2017. She has turned in logs of this attendance and utilizes her sponsor during moments of stress. She reported that the ladies she has met at [place], as well as her sponsor, are very helpful and are her main sources of social support outside of her parents. She has never attempted sobriety prior to this, and she has only been abstinent during the periods in which she was pregnant.    Ms. Scalia tried cigarettes and tobacco in college and denied other drug use. She denied DUIs or legal issues as a result of substance use. She reported martial conflict and occupational difficulties (i.e., being fired from her job) due to her drinking.     MENTAL STATUS EXAM:   Ms. Cespedes arrived on time to her appointment and was unaccompanied. She was alert and oriented to person, place, time, and the context of the situation. Her mood fluctuated between positive and dysthymic, and her affect was congruent and broad. She became tearful on several occasions when discussing her marriage and her alcohol use. She seemed to have good insight into the ways in which her drinking has contributed to her illness. She was open about acknowledging her past use and her mood symptoms. Her speech was spontaneous and fluent, and without word finding difficulty. She appeared forthcoming during the interview.     IMPRESSIONS:   Ms. Kellen has a significant history of alcohol use. She reported drinking 6-44 alcoholic beverages per day for many years, but has not had a drink since 09/11/17. She has no previous attempts at sobriety, suggesting that she may need long-term support. Ms. Cranmer has been attending AA groups regularly and utilizing her sponsor as needed. Overall, Ms. Ullmer has good insight into the relationship between her drinking and her illness. She is  motivated to stay sober, and she appears humbled and fearful about her health status.    Ms. Peckman mother and father, her primary and secondary caregivers, both drive and are in good health. They are retired and would relocate to [facility] if Ms. Dunkerson receives a transplant. Ms. Limburg reported good relationships with them and did not foresee any obstacles to their caregiving support. Although they were not present for this evaluation, they have attended other appointments with the transplant team. Overall, it seems as though they have been good sources of support for her.     Ms. Farnam said that her marriage has caused her much stress and depression recently. At today's visit, she was tearful and had occasional difficulty maintaining her composure when talking about emotional topics. Although she reported no prior depressive episodes and her current mood symptoms  appear situationally-driven, it is recommended that her mood is monitored as she goes through challenges with her health. It would be helpful for her to engage in mental health treatment before and after transplant, to bolster coping mechanisms and help with relapse prevention.     Ms. Grimes has good insight into her mood and she has already completed an intake evaluation with a therapist. She stated that she has not started therapy due to recent stressors, including a separation from her husband (with associated financial limitations) and taking care of her eldest daughter who has bipolar disorder. The therapist she saw reportedly recommended an IOP program, which, while ideal, may not be realistic given Ms. Horn's many competing demands (medical appointments, caring for her 5 children and her ill daughter). She is encouraged to lean on her parents for support at this time. They have expressed that they are in-town to be a resource for Ms. Owens Shark, and her mother has offered to stay with Ms. Cerrito's daughter in order for Ms. Callies to attend AA groups.  While a dual diagnosis/IOP program might be the most appropriate option for her, from a more practical standpoint - although not an absolute pre-requisite for transplant - establishing care with an individual therapist is recommended.     RECOMMENDATIONS:  1. Ms. Vanduyn needs to continue attending AA groups to help with her sobriety and relapse prevention.   2. Although not required as a pre-requisite for transplant, I recommend she engage in psychotherapy for her depression. Even though she has done an excellent job maintaining her sobriety since her ESLD diagnosis, she is dealing with several emotional stressors at this time.     Thank you very much for allowing [place] to participate in evaluation and care of this woman.        Hardin Negus, Ph.D., ABPP  Board Certified Clinical Neuropsychologist  [facility]     90 minutes were spent on record review, evaluation, and report preparation.

## 2018-01-06 ENCOUNTER — Telehealth (INDEPENDENT_AMBULATORY_CARE_PROVIDER_SITE_OTHER): Payer: Self-pay | Admitting: Nurse Practitioner

## 2018-01-06 NOTE — Telephone Encounter (Signed)
Called Linda case manager  [phone] to f/u on ECHO and EKG. It appears that referral to Dr Nadara Mode was already placed and waiting on patient to schedule.    Attempted to call patient to follow up. No answer. LEft message.

## 2018-01-12 ENCOUNTER — Encounter (INDEPENDENT_AMBULATORY_CARE_PROVIDER_SITE_OTHER): Payer: Self-pay | Admitting: Nurse Practitioner

## 2018-01-12 NOTE — Progress Notes (Addendum)
Patient records were obtained from: [facility]    Approximately 15 minutes of non face-to-face time was spent in review of patient's clinical course to date, labs, imaging studies, procedures, surgical interventions, specialty consultations and the findings are summarized below:    Pertinent Testing:    ECHO 01/04/18  Conclusions:  Normal left ventricular dimensions and wall thickness  Normal left ventricular wall motion and systolic function  Trace mitral regurgitations.  Mild tricuspid regurgitations with a PA pressure of 23-71mmHg and  RAP of 0-46mmHg  No pericardiac effusion.

## 2018-01-19 ENCOUNTER — Encounter (INDEPENDENT_AMBULATORY_CARE_PROVIDER_SITE_OTHER): Payer: Self-pay | Admitting: Nurse Practitioner

## 2018-01-19 NOTE — Progress Notes (Signed)
[facility]: [facility]  Approximately 20 minutes of non face-to-face time was spent in review of patient's clinical course to date, labs, imaging studies, procedures, surgical interventions, specialty consultations and the findings are summarized below:    Pertinent Labs:  12/29/17  WBC 4.6, RBC 3.6, Hgb 10.9, Hct 32, MCV 89, MCH 30, MCHC 34, RDW 13.4, Plt 211, MPV 7.1, Neuts 52, Lymphs, 37, Monos 9, Eos 2, Basos 0    PT 13.9, INR 1.2    Glu 104, Na 135, K 4.4, Cl 99, CO2 24, BUN 14, Creat 1.1, [place] 9.9, ALP 105, AST 45, ALT 23, Bili 1.8, Pro 7.8, Alb 4.1, GFR non AA 56, GFR AA >60    MELD-Na 13    Iron 82, UIBC 171, Iron Sat 32  Ferritin 261    CK 55    TSH 1.51    Folate 13.6, Vit B12 140     Vitamin D 29.3     AFP 4, AFP L3 7.9, DCP 0.4    HSV II IgG 0.18 (Neg), 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 Folate normal, not warranted. Needs to continue vitamin D supplement 2000 IU daily

## 2018-01-21 ENCOUNTER — Other Ambulatory Visit (HOSPITAL_COMMUNITY)
Admission: RE | Admit: 2018-01-21 | Discharge: 2018-01-21 | Disposition: A | Discharge status: Home / Self Care | Payer: BC Managed Care – PPO | Source: Ambulatory Visit | Attending: Nurse Practitioner | Admitting: Nurse Practitioner

## 2018-01-21 ENCOUNTER — Other Ambulatory Visit: Payer: Self-pay | Admitting: Nurse Practitioner

## 2018-01-21 DIAGNOSIS — Z01419 Encounter for gynecological examination (general) (routine) without abnormal findings: Secondary | ICD-10-CM | POA: Insufficient documentation

## 2018-01-25 LAB — CYTOLOGY - PAP
Chlamydia: NEGATIVE
Diagnosis: NEGATIVE
HPV: NOT DETECTED
Neisseria Gonorrhea: NEGATIVE

## 2018-02-02 ENCOUNTER — Telehealth (INDEPENDENT_AMBULATORY_CARE_PROVIDER_SITE_OTHER): Payer: Self-pay

## 2018-02-02 NOTE — Telephone Encounter (Signed)
Called patient left her a detail message to please call me back regarding if she had her EKG done. Thank you

## 2018-02-23 ENCOUNTER — Telehealth (INDEPENDENT_AMBULATORY_CARE_PROVIDER_SITE_OTHER): Payer: Self-pay

## 2018-02-23 NOTE — Telephone Encounter (Signed)
Feliberto Hartsalled Jennifer, RN for Dr.Polotoske  [phone] asked if she has PETH and nicotine results. She said she has a PETH results that she will fax today but no nicotine. I faxed new order to her for patient with MELD, PETH and Nicotine lab form. Called patient's not able to leave a message then called her mom and let her know patient needs to get labs she understand and will let patient know. Thank you.

## 2018-03-11 ENCOUNTER — Telehealth (INDEPENDENT_AMBULATORY_CARE_PROVIDER_SITE_OTHER): Payer: Self-pay

## 2018-03-11 LAB — COMPREHENSIVE METABOLIC PANEL, BLOOD
ALT (SGPT): 18
AST (SGOT): 34
Albumin/Globulin Ratio: 1.3
Albumin: 3.9
Alkaline Phos: 130
BUN: 15
Bicarbonate: 23
Bilirubin, Total: 1.1
Calcium: 9.8
Chloride: 102
Creatinine: 1.01
Globulin: 2.9
Glucose: 92
Phosphatidylethanol (PEth): NEGATIVE
Potassium: 4.5
Quantiferon TB: NEGATIVE
Sodium: 138
Total Protein: 6.8

## 2018-03-11 LAB — HEPATITIS B SURFACE AB, QUANT, BLOOD: Hepatitis B Surface Ab, Quant: NEGATIVE

## 2018-03-11 LAB — HEPATITIS B CORE AB TOTAL
Coccidiodes Antibody IgM, Serum: NEGATIVE
Coccidioides Ab (Id): NEGATIVE
Hepatitis A Antibody: POSITIVE
Hepatitis B Core Ab Total: NEGATIVE
Hepatitis B Surface Ag: NEGATIVE

## 2018-03-11 LAB — LIPID(CHOL FRACT) PANEL, BLOOD
Cholesterol: 200
HDL Cholesterol: 60
LDL Cholesterol: 125
Triglycerides: 76

## 2018-03-11 LAB — HEPATITIS C AB, BLOOD: Hepatitis C Ab: NEGATIVE

## 2018-03-11 NOTE — Telephone Encounter (Signed)
Labs received from [facility] for 03/05/18 entered in Epic. Routed to Omar Person, NP for review.     Kristine Garbe, BSN, RN  [facility]

## 2018-03-14 ENCOUNTER — Encounter (INDEPENDENT_AMBULATORY_CARE_PROVIDER_SITE_OTHER): Payer: Self-pay | Admitting: Nurse Practitioner

## 2018-03-15 ENCOUNTER — Telehealth (INDEPENDENT_AMBULATORY_CARE_PROVIDER_SITE_OTHER): Payer: Self-pay

## 2018-03-15 NOTE — Telephone Encounter (Signed)
Called patient let her know she needs to get INR labs she said she will get them done tomorrow.

## 2018-03-17 ENCOUNTER — Encounter (INDEPENDENT_AMBULATORY_CARE_PROVIDER_SITE_OTHER): Payer: Self-pay

## 2018-03-18 ENCOUNTER — Encounter (INDEPENDENT_AMBULATORY_CARE_PROVIDER_SITE_OTHER): Payer: Self-pay | Admitting: Nurse Practitioner

## 2018-03-18 NOTE — Progress Notes (Signed)
Record Review    03/16/2018  WBC 5.9, RBC 4.29, Hgb 12.4, Hct 37.4, Mcv 87, MCH 28.9, MCHC 33.2, RDW 13.5, Neuts 53, Lyphs 37, Monos 7, Eos 3, Basos 0    Glu 92, BUN 15, Creat 1.11, GFR nonAA 59, GFR AA 68, Na 138, K 4.8, Cl 98, CO2 23, [place] 9.6    Pro 7, Alb 4, Tbil 1.1, ALP 146, AST 36, ALT 19    INR 1.1, PT 11.7    MELD-Na: 9    HBsAb reactive    HCV negative  HBsAg negative   HBcAb negative   HAV Ab positive

## 2018-03-23 ENCOUNTER — Encounter (INDEPENDENT_AMBULATORY_CARE_PROVIDER_SITE_OTHER): Payer: Self-pay | Admitting: Nurse Practitioner

## 2018-03-23 ENCOUNTER — Encounter (INDEPENDENT_AMBULATORY_CARE_PROVIDER_SITE_OTHER): Payer: Self-pay

## 2018-03-23 NOTE — Committee Review (Addendum)
Evaluation Date: 11/10/2017  Committee Review Date: 03/23/2018    Organ being evaluated for: Liver    Transplant Phase: Evaluation  Transplant Status: Active    Transplant Coordinator: Verdie Drown  Transplant Surgeon:       Referring Physician: Troy Sine Politoske    Primary Diagnosis:   Secondary Diagnosis:     Committee Review Members:  Anesthesiology Lenore Manner, MD   Financial-Txp Committee Use Only Balinda Quails   General Surgery Berneice Gandy, MD, Einar Crow, MD, Kathreen Devoid, MD, Johnnye Lana, MD   Hepatology Thurmon Fair, MD, Beatris Si, MD, Pranab Meher Charlann Lange, MD, Clovis Fredrickson, MD, Brandy Hale, MD, Lisabeth Devoid, MD   Licensed Clinical Social Worker [place], LCSW, Alvira Philips, LCSW, Carney Living [place], LCSW   Multidisciplinary-Txp Committee Use Only Doristine Locks, MD   Nutrition Jefferson Fuel, RD   Transplant Administration-Txp Committee Use Only Gertude Mechele Dawley, RN   Transplant-Txp Committee Use Only Forrestine Him, NP, Amy Carlyle Lipa, RN, Raylene Miyamoto [place], Bernadene Person, NP, Dalia Mariella Saa, RN, Verdie Drown, NP, Kristine Garbe, RN, Dionisio Paschal, RN, Conception Chancy, NP       Transplant Eligibility: Alcoholic Cirrhosis    Committee Review Decision: Declined    Relative Contraindications: None    Absolute Contraindications: None    Committee Discussion Details: 48 y/o with history of alcoholic cirrhosis. MELD previously 27 down to 9. Remove from evaluation, transplant not indicated.     IBerneice Gandy, MD, was present for the Multi-Disciplinary Committee Review for [employer]. After having reviewed and discussed all related medical criteria related to this patient's candidacy for transplant, I attest that I agree with the committee's decision.       Berneice Gandy, MD  03/31/2018 2:18 PM

## 2018-03-24 ENCOUNTER — Encounter (INDEPENDENT_AMBULATORY_CARE_PROVIDER_SITE_OTHER): Payer: Self-pay

## 2018-03-26 ENCOUNTER — Other Ambulatory Visit: Payer: Self-pay

## 2018-03-26 ENCOUNTER — Encounter (INDEPENDENT_AMBULATORY_CARE_PROVIDER_SITE_OTHER): Payer: Self-pay

## 2018-03-30 ENCOUNTER — Encounter (INDEPENDENT_AMBULATORY_CARE_PROVIDER_SITE_OTHER): Payer: Self-pay | Admitting: Nurse Practitioner

## 2018-03-30 ENCOUNTER — Ambulatory Visit (INDEPENDENT_AMBULATORY_CARE_PROVIDER_SITE_OTHER): Payer: PRIVATE HEALTH INSURANCE | Admitting: Gastroenterology

## 2018-03-30 VITALS — BP 141/89 | HR 78 | Temp 97.9°F | Resp 17 | Ht 62.0 in | Wt 145.7 lb

## 2018-03-30 DIAGNOSIS — Z9189 Other specified personal risk factors, not elsewhere classified: Secondary | ICD-10-CM

## 2018-03-30 DIAGNOSIS — K7030 Alcoholic cirrhosis of liver without ascites: Secondary | ICD-10-CM

## 2018-03-30 NOTE — Progress Notes (Signed)
CLINIC: [facility]  ATTENDING: Marquette Saa, M.D.  DATE OF SERVICE: 03/30/2018    [name]    REASON FOR VISIT: F/U FOR ALD    Family History   Problem Relation Name Age of Onset   . Cancer Mother l      Past Medical History:   Diagnosis Date   . Cirrhosis of liver with ascites (CMS-HCC)      No past surgical history on file.      Current Outpatient Medications   Medication Sig Dispense Refill   . baclofen (LIORESAL) 10 MG tablet Take 1 tablet (10 mg) by mouth 3 times daily. 90 tablet 3   . furosemide (LASIX) 40 MG tablet Take 0.5 tablets (20 mg) by mouth daily.  3   . hydrOXYzine HCL (ATARAX) 25 MG tablet 25mg  in the morning, afternoon, late afternoon, and 50mg  in the evening 30 tablet 3   . lactulose 10 GM/15ML solution 20 g by Oral route.     Marland Kitchen spironolactone (ALDACTONE) 100 MG tablet Take 0.5 tablets (50 mg) by mouth daily. 30 tablet 3     No current facility-administered medications for this visit.        ALLERGIES: No Known Allergies      LABS: Labs were reviewed.  Results for Evelyn, Chavez (MRN [id number]) as of 03/30/2018 10:05   Ref. Range 03/05/2018 10:28   Glucose Unknown 92   BUN Unknown 15   Creatinine Unknown 1.01   Sodium Unknown 138   Potassium Unknown 4.5   Chloride Unknown 102   Bicarbonate Unknown 23   Calcium Unknown 9.8   AST (SGOT) Unknown 34   ALT (SGPT) Unknown 18   Bilirubin, Total Unknown 1.1   Alkaline Phos Unknown 130   Total Protein Unknown 6.8   Albumin Unknown 3.9   Albumin/Globulin Ratio Unknown 1.3   Cholesterol Unknown 200   HDL Cholesterol Unknown 60   LDL Cholesterol Unknown 125   Triglycerides Unknown 76   Globulin Unknown 2.9     Results for Evelyn, Chavez (MRN [id number]) as of 03/30/2018 10:05   Ref. Range 03/05/2018 10:28   Coccidiodes Antibody IgM, Serum Unknown Negative   Hepatitis A Antibody Unknown Positive   Hepatitis B Core Ab Total Unknown Negative   Hepatitis B Surface Ab, Quant Unknown Negative   Hepatitis C Ab  Unknown Negative       Imaging Studies:  Reviewed CT abdomen October 2019: fatty cirrhotic liver, no ascites, no PVT, no HCC    Endoscopy Reports  In 2019 DR Politoske no EV, moderate portal HTN gastropathy    HISTORY: Evelyn Chavez is a 48 year old female followed for ALD    FIrst seen on 11/10/17:      [name], [name], MD   Attending Physician   Hepatology   Progress Notes    Signed    Encounter Date:  11/10/2017                      [] ?Hide copied text    [] ?Hover for details  I examined the patient, reviewed pertinent Hx, biochemistry results, imaging results and images, pertinent endoscopy results. I discussed case with Dr Theodoro Grist and I agree with his assessment and recommendations with the following added comments:    48 yo female with AAH and cirrhosis  Abstinentsince JUne 2019,going to AA. Was never told she had liver disease, once she decompensated with ascites and edema and juandice  she quit. Alcohol use associated with marital issues, see MSW eval.  Has responded well to a low sodium diet and current dosing of diuretics.   Mother of 5  Issues with husband drinking as well, who may not be supportive of her abstinence and goal of liver transplant  Hx of Ascites and edema and ascites.  Enlarged liveron exam, palmar erythema, spider angioma on chest, hands  Itching  No asterixis  No edema  No ascites    Denies tobacco use, other drug use.    MELD approx 25-27, Acute alcoholic hepatitis, did not respond to prednisolone. Alcoholic cirrhosis. Ansarca reponded to diuretics. Needs EV screening.    May be a good LT candidate but care giver issuesmay be a hurdle  Continue AA  InitiateLT evaltoday including MSW consult today    Dr Jackqulyn LivingsMendler        Electronically signed by Beatris SiMendler, [name], MD at 11/11/2017 7:31 PM      12/22/17  INTERVAL HISTORY: . In the interim the patient has not required hospitalization or emergency care.     Here with her mother and father (divroced) who came in from  [place]. Husband is drinking less at home but not really supportive of the patient nor of their 5 children, the oldest of which, 617 yo, is having a manic flair of her severe bipolar disorder, and was recently briefly hospitalised. Daughter having manic episode  Patient is very stressed, understandably and havig Having cravings    Despite the above she has remained abstinent from alcohol use and is feeling much better than 3 months prior. Her transaminases have declined; her TB and her MELD has declined from 27 to 19. I suspect her abstinence has led to significant improvements in liver health.  In another 2715m her MELD score may well deceline below liver transplant citeria.     PLAN:   1) needs Echo, PAP, and CT abdomen [facility] phase [facility]  [facility]  3) Baclofen 10mg  TID for alcohol cravings  4) To f/u with MSW today.      MELD:   ABO:     03/30/2018  INTERVAL HISTORY:   In the interim the patient has not required hospitalization or emergency care.   Goes to 4 AA meetings a week. Enjoys AA. Has good support group.  Cohabitating with husband. He continues to drinka at home, not as often and not as much as before. Psychotic daughter doing better going to a good psych program at  [facility], she is home. 48 yo    Does not take Baclofen.  Now denies alcohol cravings  Doctor Politosky found no EV oeon EGD did by Dr Sandi MariscalPlitoske in September 2019.  Had flu vaccine by PCP  in November 2019    No edema no ascites no HE no asterixis gainingg fat weight not drinking    Reviewd October CT abdomen: fatty ;liver cirrhopsis no fluid overload , no collaterals, this is my interpretation    LIVER SPECIFIC SIGNS AND SYMPTOMS:  Confusion: No  Abdominal distention: No  Melena: No  Hematemesis: No  Hematochezia: No  Lower extremity edema: No    REVIEW OF SYSTEMS:    Constitutional: feels well   HEENT: denies   Cardiovascular: denies   Respiratory:denies   Gastrointestinal:denies    Genitourinary: denies   Musculoskeletal: denies   Skin: denies  Neurological: denies   Psychiatric:denies   Endocrine: denies   Hematologic: denies  PHYSICAL EXAM:  BP 141/89   Pulse 78   Temp 97.9 F (36.6 C)   Resp 17   Ht 5\' 2"  (1.575 m)   Wt 66.1 kg (145 lb 11.2 oz)   BMI 26.65 kg/m   General:  Alert and oriented x 3, conversant, pleasant and appropriate.  ENT: Oropharynx is clear, mucus membranes are moist.  Pulmonary:  Clear to auscultation bilaterally.  Cardiovascular: Regular rate and rhythm. No murmurs, rubs, gallops.  GI: Abdomen soft, non-tender, non-distended. HARD LIVER EDGE IS EASILY PALPABLE.  Extremities: No lower extremity edema.  Neurological:  No asterixis. No gross motor or sensory deficits.  Skin: sun damage, numerous spider angioma on chest    MELD 9    Assessment: 48 yo female with alcoholic liver disease initially presenting as acute alcoholic hepatitis in the background of alcoholic cirrhosis with dramatic improvement in clinical status and liver function with over 22m of complete abstinence. Current MELD is 9. She is very well compensated. She is up to date on [facility] surveillance and is negative. There is no longer and indication for liver transplant.    PLAN:  1) Patient to be removed for liver transplant list because MELD too low and well compensated  2) transition to Hepatology under the care of Dr Jackqulyn Livings and his team, RTC 70m at [facility] (Friday AM clinic). Or she may prefer to f/u with Dr Ranae Plumber, she will let us know via MyChart  3) CT abdomen w/wo IV contrast liver protocol in April /May 2020 for [facility] surveillance: please print out order for patient so she can take it to  Outside imaging center in [place].  4) labs in 2m: CBC CMP INR AFP     45 min spent in coordinating care for this patient, >50% spent in face-to-face counseling for the above issues. All questions answered and explained to patient's satisfaction.   Dr Jackqulyn Livings

## 2018-03-30 NOTE — Progress Notes (Signed)
Waited to see patient in person to discuss outcome from liver selection meeting last week. She was appreciative of the care she received and is happy not need to undergo liver transplant. She verbalizes her continued attendance in [place] and will continue to get logs. She was given the opportunity to ask questions and had none at this time.

## 2018-03-30 NOTE — Progress Notes (Signed)
Plan:    -- spoke with patient today regarding her being declined for liver transplant, due to being too well for transplant. Patient was appreciative of the care she she received.   -- she will continue her care with Dr. Lavella Hammock.

## 2018-03-30 NOTE — Patient Instructions (Addendum)
Thank you for allowing me to participate in your care today.     PLAN:  1) Patient to be removed for liver transplant list because MELD too low  2) transition to Hepatology under the care of Dr Jackqulyn Livings and his team, RTC 6m at [facility] (Friday AM clinic). Or she may prefer to f/u with Dr Ranae Plumber, she will let us know via MyChart  3) CT abdomen w/wo IV contrast liver protocol in April /May 2020 for [facility] surveillance: please print out order for patient so she can take it to  Outside imaging center in [place].  4) labs in 37m: CBC CMP INR AFP      - Please arrive 15 minutes early to your scheduled appointment.     Please sign up for "My Chart" which will allow you to view lab results and communicate with me and my team.    Clinic MA or RN please give pt information as needed. Thanks     If you have any questions or concerns please contact my care team:    Williemae Natter, NP   Ezra Sites, RN [phone]  Sanjuan Dame, adm asst [phone] For appointments and insurance issues.    Regards,     Marquette Saa, MD  Hepatology  Health Science Clinical Professor  [facility]

## 2018-06-21 ENCOUNTER — Telehealth (INDEPENDENT_AMBULATORY_CARE_PROVIDER_SITE_OTHER): Payer: Self-pay | Admitting: Nurse Practitioner

## 2018-06-21 NOTE — Telephone Encounter (Signed)
Called Bonita Quin, Case manager to follow up on status of patient and that she wanted to continue her care with [facility], however is not a candidate for transplant because of her ongoing medical improvement with sobriety. Bonita Quin, RN is aware and closed her case 1.5 months ago and that she would need to continue her care with Dr. Lavella Hammock. Explained this to patient, and is agreement and aware but was confused when Dr. Jackqulyn Livings told her she had a choice to follow up with him or Dr  Lavella Hammock. She preferred staying with Dr. Jackqulyn Livings. Apologized to her for the confusion and recommend she follow up with her GI doctor soon and to get updated imaging with labs. She verbalizes understanding.

## 2018-06-22 ENCOUNTER — Telehealth (INDEPENDENT_AMBULATORY_CARE_PROVIDER_SITE_OTHER): Payer: Self-pay

## 2018-06-22 NOTE — Telephone Encounter (Signed)
RN called Dr. Timmie Foerster office with Evelyn Chavez. Patient has a CT abdomen scheduled with [facility] for 4/22 and RN was notified she does not have insurance coverage to complete this scan. Patient was removed from LT eval in January- too well, low MELD. Spoke with Dr. Timmie Foerster RN who states patient has not scheduled f/u with them but she will reach out to patient to schedule f/u and imaging with them. Ok to cancel appointment for CT with [facility].     Kristine Garbe, BSN, RN  [facility]

## 2018-06-30 ENCOUNTER — Ambulatory Visit (HOSPITAL_BASED_OUTPATIENT_CLINIC_OR_DEPARTMENT_OTHER): Payer: PRIVATE HEALTH INSURANCE

## 2018-08-23 ENCOUNTER — Institutional Professional Consult (permissible substitution) (INDEPENDENT_AMBULATORY_CARE_PROVIDER_SITE_OTHER): Admitting: Family Medicine

## 2018-09-11 ENCOUNTER — Telehealth (HOSPITAL_BASED_OUTPATIENT_CLINIC_OR_DEPARTMENT_OTHER): Payer: Self-pay | Admitting: Gastroenterology

## 2018-09-11 NOTE — Telephone Encounter (Signed)
48 yo female with ALD cirrhosis, previously seen in LT, to have transitioned to Hepatology as pt too well for LT consideration with good recovery.    Evelyn Chavez-     Pt was scheduled on 03/30/18 for f/u with Dr Blanche East for 10/01/18.When pt was discharged from LT, it was determined at that time pt would f/u with [facility] per insurance.  F/u app't with Dr.Mendler was not cancelled.   Please cancel 10/01/18 app't. Send letter to pt. Thanks.    TC 06/22/18    RN called Dr. Annell Greening office with [facility]. Patient has a CT abdomen scheduled with [facility] for 4/22 and RN was notified she does not have insurance coverage to complete this scan. Patient was removed from LT eval in January- too well, low MELD. Spoke with Dr. Annell Greening RN who states patient has not scheduled f/u with them but she will reach out to patient to schedule f/u and imaging with them. Ok to cancel appointment for CT with [facility].     Jetta Lout, BSN, RN  [facility]

## 2018-09-13 NOTE — Telephone Encounter (Signed)
Called and s/w patient who confirms her new Hepatologist she see's is through [facility]. 7/24 appt has now been cancelled as requested.

## 2018-10-01 ENCOUNTER — Encounter (INDEPENDENT_AMBULATORY_CARE_PROVIDER_SITE_OTHER): Payer: PRIVATE HEALTH INSURANCE | Admitting: Gastroenterology

## 2018-12-20 ENCOUNTER — Encounter (INDEPENDENT_AMBULATORY_CARE_PROVIDER_SITE_OTHER): Payer: Self-pay | Admitting: Neurology

## 2018-12-20 ENCOUNTER — Telehealth (INDEPENDENT_AMBULATORY_CARE_PROVIDER_SITE_OTHER): Payer: Self-pay | Admitting: Neurology

## 2018-12-20 ENCOUNTER — Ambulatory Visit (INDEPENDENT_AMBULATORY_CARE_PROVIDER_SITE_OTHER): Admitting: Neurology

## 2018-12-20 VITALS — BP 126/85 | HR 79 | Temp 96.6°F | Ht 62.0 in | Wt 135.2 lb

## 2018-12-20 MED ORDER — SULFASALAZINE 500 MG OR TABS: 1000.00 mg | ORAL_TABLET | Freq: Two times a day (BID) | ORAL | Status: AC

## 2019-01-11 ENCOUNTER — Other Ambulatory Visit: Payer: Self-pay | Admitting: Nurse Practitioner

## 2019-01-20 ENCOUNTER — Ambulatory Visit (INDEPENDENT_AMBULATORY_CARE_PROVIDER_SITE_OTHER): Admitting: Neurology

## 2019-01-20 ENCOUNTER — Encounter (INDEPENDENT_AMBULATORY_CARE_PROVIDER_SITE_OTHER): Payer: Self-pay | Admitting: Neurology

## 2019-03-02 ENCOUNTER — Ambulatory Visit: Payer: BC Managed Care – PPO | Attending: Internal Medicine

## 2019-03-02 DIAGNOSIS — Z20822 Contact with and (suspected) exposure to covid-19: Secondary | ICD-10-CM

## 2019-03-04 LAB — NOVEL CORONAVIRUS, NAA: SARS-CoV-2, NAA: NOT DETECTED

## 2019-03-11 DIAGNOSIS — Z9151 Personal history of suicidal behavior: Secondary | ICD-10-CM

## 2019-03-11 HISTORY — DX: Personal history of suicidal behavior: Z91.51

## 2019-03-24 ENCOUNTER — Encounter: Payer: Self-pay | Admitting: Hospital

## 2019-04-01 ENCOUNTER — Emergency Department (HOSPITAL_COMMUNITY)
Admission: EM | Admit: 2019-04-01 | Discharge: 2019-04-02 | Disposition: A | Discharge status: Other Acute Inpatient Hospital | Payer: BC Managed Care – PPO | Attending: Emergency Medicine | Admitting: Emergency Medicine

## 2019-04-01 ENCOUNTER — Other Ambulatory Visit: Payer: Self-pay

## 2019-04-01 DIAGNOSIS — R45851 Suicidal ideations: Secondary | ICD-10-CM | POA: Diagnosis not present

## 2019-04-01 DIAGNOSIS — Z20822 Contact with and (suspected) exposure to covid-19: Secondary | ICD-10-CM | POA: Insufficient documentation

## 2019-04-01 DIAGNOSIS — Z79899 Other long term (current) drug therapy: Secondary | ICD-10-CM | POA: Diagnosis not present

## 2019-04-01 DIAGNOSIS — Z87891 Personal history of nicotine dependence: Secondary | ICD-10-CM | POA: Diagnosis not present

## 2019-04-01 DIAGNOSIS — F419 Anxiety disorder, unspecified: Secondary | ICD-10-CM | POA: Insufficient documentation

## 2019-04-01 DIAGNOSIS — Z046 Encounter for general psychiatric examination, requested by authority: Secondary | ICD-10-CM | POA: Diagnosis present

## 2019-04-01 DIAGNOSIS — F329 Major depressive disorder, single episode, unspecified: Secondary | ICD-10-CM | POA: Insufficient documentation

## 2019-04-01 DIAGNOSIS — F908 Attention-deficit hyperactivity disorder, other type: Secondary | ICD-10-CM | POA: Insufficient documentation

## 2019-04-01 DIAGNOSIS — F4310 Post-traumatic stress disorder, unspecified: Secondary | ICD-10-CM | POA: Insufficient documentation

## 2019-04-01 LAB — CBC
HCT: 37.9 % (ref 36.0–46.0)
Hemoglobin: 12.4 g/dL (ref 12.0–15.0)
MCH: 29.5 pg (ref 26.0–34.0)
MCHC: 32.7 g/dL (ref 30.0–36.0)
MCV: 90 fL (ref 80.0–100.0)
Platelets: 340 10*3/uL (ref 150–400)
RBC: 4.21 MIL/uL (ref 3.87–5.11)
RDW: 13.6 % (ref 11.5–15.5)
WBC: 8.5 10*3/uL (ref 4.0–10.5)
nRBC: 0 % (ref 0.0–0.2)

## 2019-04-01 LAB — COMPREHENSIVE METABOLIC PANEL
ALT: 14 U/L (ref 0–44)
AST: 20 U/L (ref 15–41)
Albumin: 3.9 g/dL (ref 3.5–5.0)
Alkaline Phosphatase: 36 U/L — ABNORMAL LOW (ref 38–126)
Anion gap: 10 (ref 5–15)
BUN: 7 mg/dL (ref 6–20)
CO2: 26 mmol/L (ref 22–32)
Calcium: 8.8 mg/dL — ABNORMAL LOW (ref 8.9–10.3)
Chloride: 101 mmol/L (ref 98–111)
Creatinine, Ser: 0.84 mg/dL (ref 0.44–1.00)
GFR calc Af Amer: >60 mL/min (ref >60)
GFR calc non Af Amer: >60 mL/min (ref >60)
Glucose, Bld: 108 mg/dL — ABNORMAL HIGH (ref 70–99)
Potassium: 3.3 mmol/L — ABNORMAL LOW (ref 3.5–5.1)
Sodium: 137 mmol/L (ref 135–145)
Total Bilirubin: 0.4 mg/dL (ref 0.3–1.2)
Total Protein: 7.5 g/dL (ref 6.5–8.1)

## 2019-04-01 LAB — SALICYLATE LEVEL: Salicylate Lvl: <7 mg/dL — ABNORMAL LOW (ref 7.0–30.0)

## 2019-04-01 LAB — ACETAMINOPHEN LEVEL: Acetaminophen (Tylenol), Serum: <10 ug/mL — ABNORMAL LOW (ref 10–30)

## 2019-04-01 LAB — ETHANOL: Alcohol, Ethyl (B): <10 mg/dL (ref <10)

## 2019-04-01 LAB — I-STAT BETA HCG BLOOD, ED (MC, WL, AP ONLY): I-stat hCG, quantitative: <5 m[IU]/mL (ref <5)

## 2019-04-01 MED ORDER — POTASSIUM CHLORIDE CRYS ER 20 MEQ PO TBCR
40.0000 meq | EXTENDED_RELEASE_TABLET | Freq: Once | ORAL | Status: AC
  Administered 2019-04-02: 40 meq via ORAL
  Filled 2019-04-01: qty 2

## 2019-04-01 NOTE — ED Provider Notes (Signed)
[facility]-EMERGENCY DEPT Provider Note   CSN: [id number] Arrival date & time: 04/01/19  2216     History Chief Complaint  Patient presents with  . IVC    Monica [name] is a 49 y.o. female with a history of PTSD, ADD, and anxiety who presents to the emergency department by [facility] under IVC.  The patient reports that she found a security deposit that her significant other of 11 years last week. Earlier today, she reports that her significant other finalized on a lease and would be leaving her.  She reports that she became very upset and started grabbing objects around the house because she was feeling suicidal. Police found the patient with gasoline and a lighter.   She reports that she was previously established with a therapist, but has not been seen in some time.  She attempted to get established with a new therapist earlier this week, but has not yet been seen.  She denies HI or auditory visual hallucinations.  No history of previous psychiatric admissions.   She has no other complaints at this time including fever, chills, shortness of breath, nausea, vomiting, diarrhea, dizziness, lightheadedness.  Denies illicit or recreational substance use.    The history is provided by the patient. No language interpreter was used.       Past Medical History:  Diagnosis Date  . ADD (attention deficit disorder)   . Anxiety disorder   . Heart murmur   . Herpes simplex type 1 infection   . Insomnia    unspecified  . Migraines   . MRSA infection    left great toe  . PTSD (post-traumatic stress disorder)   . Seasonal allergies     Patient Active Problem List   Diagnosis Date Noted  . Chronic migraine without aura without status migrainosus, not intractable 01/20/2017    Past Surgical History:  Procedure Laterality Date  . REDUCTION MAMMAPLASTY       OB History   No obstetric history on file.     Family History  Problem Relation Age of Onset  .  Bipolar disorder Mother   . Lung cancer Father   . Migraines Neg Hx     Social History   Tobacco Use  . Smoking status: Former Smoker    Types: Cigarettes    Quit date: 07/30/2011    Years since quitting: 7.6  . Smokeless tobacco: Never Used  Substance Use Topics  . Alcohol use: Yes    Comment: occasional  . Drug use: No    Home Medications Prior to Admission medications   Medication Sig Start Date End Date Taking? Authorizing Provider  amphetamine-dextroamphetamine (ADDERALL XR) 20 MG 24 hr capsule Take 40 mg by mouth every morning.   Yes [provider]  cetirizine (ZYRTEC) 10 MG tablet Take 10 mg by mouth daily.   Yes [provider]  clonazePAM (KLONOPIN) 1 MG tablet Take 1 mg by mouth 2 (two) times daily as needed for anxiety.    Yes [provider]  diphenhydrAMINE (BENADRYL) 25 MG tablet Take 25 mg by mouth every 6 (six) hours as needed for allergies.   Yes [provider]  fluticasone (FLONASE) 50 MCG/ACT nasal spray Place 1 spray daily into both nostrils.   Yes [provider]  Norethin Ace-Eth Estrad-FE 1-20 MG-MCG(24) CAPS Take 1 capsule by mouth daily.  03/08/19  Yes [provider]  traZODone (DESYREL) 100 MG tablet Take 100 mg by mouth at bedtime.  Yes [provider]  albuterol (PROVENTIL HFA;VENTOLIN HFA) 108 (90 Base) MCG/ACT inhaler Inhale 2 puffs into the lungs every 6 (six) hours as needed for wheezing or shortness of breath.    [provider]  valACYclovir (VALTREX) 500 MG tablet Take 500 mg by mouth 2 (two) times daily as needed (flare up).     [provider]    Allergies    Sertraline, Pepto-bismol [bismuth subsalicylate], and Septra [sulfamethoxazole-trimethoprim]  Review of Systems   Review of Systems  Constitutional: Negative for activity change, chills and fever.  Respiratory: Negative for shortness of breath.   Cardiovascular: Negative for chest pain.   Gastrointestinal: Negative for abdominal pain.  Genitourinary: Negative for dysuria.  Musculoskeletal: Negative for back pain.  Skin: Negative for rash.  Allergic/Immunologic: Negative for immunocompromised state.  Neurological: Negative for headaches.  Psychiatric/Behavioral: Positive for suicidal ideas. Negative for agitation, confusion, dysphoric mood, hallucinations and self-injury.    Physical Exam Updated Vital Signs BP 132/83 (BP Location: Right Arm)   Pulse 79   Temp 98.2 F (36.8 C) (Oral)   Resp (!) 22   Ht 5\' 6"  (1.676 m)   Wt 86.2 kg   SpO2 96%   BMI 30.67 kg/m   Physical Exam Vitals and nursing note reviewed.  Constitutional:      General: She is not in acute distress.    Appearance: She is not ill-appearing, toxic-appearing or diaphoretic.  HENT:     Head: Normocephalic.  Eyes:     Conjunctiva/sclera: Conjunctivae normal.  Cardiovascular:     Rate and Rhythm: Normal rate and regular rhythm.     Pulses: Normal pulses.     Heart sounds: Normal heart sounds. No murmur. No friction rub. No gallop.   Pulmonary:     Effort: Pulmonary effort is normal. No respiratory distress.     Breath sounds: No stridor. No wheezing, rhonchi or rales.  Chest:     Chest wall: No tenderness.  Abdominal:     General: There is no distension.     Palpations: Abdomen is soft. There is no mass.     Tenderness: There is no abdominal tenderness. There is no right CVA tenderness, left CVA tenderness, guarding or rebound.     Hernia: No hernia is present.  Musculoskeletal:     Cervical back: Neck supple.  Skin:    General: Skin is warm.     Findings: No rash.  Neurological:     Mental Status: She is alert.  Psychiatric:        Attention and Perception: She does not perceive visual hallucinations.        Mood and Affect: Mood is depressed.        Speech: Speech normal.        Behavior: Behavior is withdrawn.        Thought Content: Thought content includes suicidal ideation.  Thought content does not include homicidal ideation. Thought content does not include homicidal plan.     ED Results / Procedures / Treatments   Labs (all labs ordered are listed, but only abnormal results are displayed) Labs Reviewed  COMPREHENSIVE METABOLIC PANEL - Abnormal; Notable for the following components:      Result Value   Potassium 3.3 (*)    Glucose, Bld 108 (*)    Calcium 8.8 (*)    Alkaline Phosphatase 36 (*)    All other components within normal limits  SALICYLATE LEVEL - Abnormal; Notable for the following components:   Salicylate  Lvl <7.0 (*)    All other components within normal limits  ACETAMINOPHEN LEVEL - Abnormal; Notable for the following components:   Acetaminophen (Tylenol), Serum <10 (*)    All other components within normal limits  RAPID URINE DRUG SCREEN, HOSP PERFORMED - Abnormal; Notable for the following components:   Amphetamines POSITIVE (*)    All other components within normal limits  RESPIRATORY PANEL BY RT PCR (FLU A&B, COVID)  ETHANOL  CBC  I-STAT BETA HCG BLOOD, ED ([facility], [facility], [facility] ONLY)    EKG None  Radiology No results found.  Procedures Procedures (including critical care time)  Medications Ordered in ED Medications  amphetamine-dextroamphetamine (ADDERALL XR) 24 hr capsule 40 mg (has no administration in time range)  loratadine (CLARITIN) tablet 10 mg (has no administration in time range)  diphenhydrAMINE (BENADRYL) capsule 25 mg (has no administration in time range)  fluticasone (FLONASE) 50 MCG/ACT nasal spray 1 spray (has no administration in time range)  Norethin Ace-Eth Estrad-FE 1-20 MG-MCG(24) CAPS 1 capsule (has no administration in time range)  traZODone (DESYREL) tablet 100 mg (100 mg Oral Given 04/02/19 0233)  potassium chloride SA (KLOR-CON) CR tablet 40 mEq (40 mEq Oral Given 04/02/19 0013)    ED Course  I have reviewed the triage vital signs and the nursing notes.  Pertinent labs & imaging results that were  available during my care of the patient were reviewed by me and considered in my medical decision making (see chart for details).    MDM Rules/Calculators/A&P                      49 year old female with a history of PTSD, ADD, and anxiety presenting under IVC for suicidal ideation.  First examination completed by Dr. Sedonia Small.  IVC paperwork reports that the patient was found holding gasoline at a later, but actively denies a concrete suicidal plan in the ER.  Exacerbating factors include her significant other of 11 years telling the patient that he is moving out.  Medical screening labs are overall reassuring.  She has a mild hypokalemia of 3.3, replenished orally in the ER.  UDS is positive for amphetamines, but this is consistent with the patient's home medications as she has prescribed Adderall XR.  COVID-19 test is negative.  Pt medically cleared at this time. Psych hold orders and home med orders placed. TTS consulted and inpatient admission is recommended. please see psych team notes for further documentation of care/dispo. Pt stable at time of med clearance.    Final Clinical Impression(s) / ED Diagnoses Final diagnoses:  Suicidal ideation    Rx / DC Orders ED Discharge Orders    None       Joanne Gavel, PA-C 04/02/19 0237    Maudie Flakes, MD 04/02/19 2312

## 2019-04-01 NOTE — ED Triage Notes (Signed)
Pt presents to ED via [employer] from home for IVC. During triage pt is tearful but cooperative, states today her ex-fiance told her he finalized on a lease and will be leaving her. Pt became upset and started grabbing things, admits to SI but denies having a solid plan. States she had been seeing a therapist for same in the past but due to COVID her therapy had been put on pause.

## 2019-04-01 NOTE — ED Notes (Signed)
Pt's belongings were placed in one white bag and kept behind the nurse's station in triage.

## 2019-04-01 NOTE — ED Notes (Signed)
Pt was dressed out in purple scrubs and belongings placed in bag and secured behind triage nurse desk.

## 2019-04-02 ENCOUNTER — Inpatient Hospital Stay
Admission: RE | Admit: 2019-04-02 | Discharge: 2019-04-04 | DRG: 882 | Disposition: A | Discharge status: Home / Self Care | Payer: BC Managed Care – PPO | Source: Intra-hospital | Attending: Psychiatry | Admitting: Psychiatry

## 2019-04-02 ENCOUNTER — Encounter: Payer: Self-pay | Admitting: Psychiatric/Mental Health

## 2019-04-02 ENCOUNTER — Other Ambulatory Visit: Payer: Self-pay

## 2019-04-02 DIAGNOSIS — F430 Acute stress reaction: Secondary | ICD-10-CM | POA: Diagnosis present

## 2019-04-02 DIAGNOSIS — Z20822 Contact with and (suspected) exposure to covid-19: Secondary | ICD-10-CM | POA: Diagnosis present

## 2019-04-02 DIAGNOSIS — I499 Cardiac arrhythmia, unspecified: Secondary | ICD-10-CM | POA: Diagnosis not present

## 2019-04-02 DIAGNOSIS — F4325 Adjustment disorder with mixed disturbance of emotions and conduct: Principal | ICD-10-CM | POA: Diagnosis present

## 2019-04-02 DIAGNOSIS — R45851 Suicidal ideations: Secondary | ICD-10-CM | POA: Diagnosis present

## 2019-04-02 DIAGNOSIS — F988 Other specified behavioral and emotional disorders with onset usually occurring in childhood and adolescence: Secondary | ICD-10-CM | POA: Diagnosis present

## 2019-04-02 DIAGNOSIS — G4700 Insomnia, unspecified: Secondary | ICD-10-CM | POA: Diagnosis present

## 2019-04-02 DIAGNOSIS — B009 Herpesviral infection, unspecified: Secondary | ICD-10-CM | POA: Diagnosis present

## 2019-04-02 DIAGNOSIS — Z87891 Personal history of nicotine dependence: Secondary | ICD-10-CM | POA: Diagnosis not present

## 2019-04-02 DIAGNOSIS — F4310 Post-traumatic stress disorder, unspecified: Secondary | ICD-10-CM | POA: Diagnosis present

## 2019-04-02 DIAGNOSIS — F908 Attention-deficit hyperactivity disorder, other type: Secondary | ICD-10-CM | POA: Diagnosis not present

## 2019-04-02 DIAGNOSIS — Z8614 Personal history of Methicillin resistant Staphylococcus aureus infection: Secondary | ICD-10-CM

## 2019-04-02 DIAGNOSIS — J45909 Unspecified asthma, uncomplicated: Secondary | ICD-10-CM | POA: Diagnosis present

## 2019-04-02 LAB — RESPIRATORY PANEL BY RT PCR (FLU A&B, COVID)
Influenza A by PCR: NEGATIVE
Influenza B by PCR: NEGATIVE
SARS Coronavirus 2 by RT PCR: NEGATIVE

## 2019-04-02 LAB — RAPID URINE DRUG SCREEN, HOSP PERFORMED
Amphetamines: POSITIVE — AB
Barbiturates: NOT DETECTED
Benzodiazepines: NOT DETECTED
Cocaine: NOT DETECTED
Opiates: NOT DETECTED
Tetrahydrocannabinol: NOT DETECTED

## 2019-04-02 MED ORDER — NORETHIN ACE-ETH ESTRAD-FE 1-20 MG-MCG(24) PO CAPS: 1.0000 | ORAL_CAPSULE | Freq: Every day | ORAL | Status: DC

## 2019-04-02 MED ORDER — AMPHETAMINE-DEXTROAMPHET ER 10 MG PO CP24
40.0000 mg | ORAL_CAPSULE | Freq: Every morning | ORAL | Status: DC
  Filled 2019-04-02: qty 4

## 2019-04-02 MED ORDER — DIPHENHYDRAMINE HCL 25 MG PO CAPS
25.0000 mg | ORAL_CAPSULE | Freq: Four times a day (QID) | ORAL | Status: DC | PRN
  Filled 2019-04-02: qty 1

## 2019-04-02 MED ORDER — HYDROXYZINE HCL 25 MG PO TABS
25.0000 mg | ORAL_TABLET | Freq: Three times a day (TID) | ORAL | Status: DC | PRN
  Administered 2019-04-03: 20:00:00 25 mg via ORAL
  Filled 2019-04-02: qty 1

## 2019-04-02 MED ORDER — ESCITALOPRAM OXALATE 10 MG PO TABS
5.0000 mg | ORAL_TABLET | Freq: Every day | ORAL | Status: DC
  Administered 2019-04-02 – 2019-04-04 (×3): 5 mg via ORAL
  Filled 2019-04-02 (×3): qty 1

## 2019-04-02 MED ORDER — CLONAZEPAM 1 MG PO TABS: 1.0000 mg | ORAL_TABLET | Freq: Two times a day (BID) | ORAL | Status: DC | PRN

## 2019-04-02 MED ORDER — POTASSIUM CHLORIDE CRYS ER 20 MEQ PO TBCR
20.0000 meq | EXTENDED_RELEASE_TABLET | Freq: Once | ORAL | Status: AC
  Administered 2019-04-02: 17:00:00 20 meq via ORAL
  Filled 2019-04-02: qty 1

## 2019-04-02 MED ORDER — ACETAMINOPHEN 325 MG PO TABS: 650.0000 mg | ORAL_TABLET | Freq: Four times a day (QID) | ORAL | Status: DC | PRN

## 2019-04-02 MED ORDER — TRAZODONE HCL 100 MG PO TABS
100.0000 mg | ORAL_TABLET | Freq: Every day | ORAL | Status: DC
  Administered 2019-04-02 – 2019-04-03 (×2): 100 mg via ORAL
  Filled 2019-04-02 (×2): qty 1

## 2019-04-02 MED ORDER — ALUM & MAG HYDROXIDE-SIMETH 200-200-20 MG/5ML PO SUSP: 30.0000 mL | ORAL | Status: DC | PRN

## 2019-04-02 MED ORDER — FLUTICASONE PROPIONATE 50 MCG/ACT NA SUSP
1.0000 | Freq: Every day | NASAL | Status: DC
  Administered 2019-04-02: 1 via NASAL
  Filled 2019-04-02: qty 16

## 2019-04-02 MED ORDER — TRAZODONE HCL 50 MG PO TABS: 50.0000 mg | ORAL_TABLET | Freq: Every evening | ORAL | Status: DC | PRN

## 2019-04-02 MED ORDER — TRAZODONE HCL 100 MG PO TABS
100.0000 mg | ORAL_TABLET | Freq: Every day | ORAL | Status: DC
  Administered 2019-04-02: 100 mg via ORAL
  Filled 2019-04-02: qty 1

## 2019-04-02 MED ORDER — MAGNESIUM HYDROXIDE 400 MG/5ML PO SUSP: 30.0000 mL | Freq: Every day | ORAL | Status: DC | PRN

## 2019-04-02 MED ORDER — LORATADINE 10 MG PO TABS
10.0000 mg | ORAL_TABLET | Freq: Every day | ORAL | Status: DC
  Administered 2019-04-02: 10 mg via ORAL
  Filled 2019-04-02: qty 1

## 2019-04-02 NOTE — H&P (Signed)
Psychiatric Admission Assessment Adult  Patient Identification: Monica [name] MRN:  [id number] Date of Evaluation:  04/02/2019 Chief Complaint:  PTSD (post-traumatic stress disorder) [F43.10] Principal Diagnosis: <principal problem not specified> Diagnosis:  Active Problems:   PTSD (post-traumatic stress disorder)  History of Present Illness: Patient is seen and examined.  Patient is a 49 year old female with a past psychiatric history significant for posttraumatic stress disorder as well as attention deficit disorder who [facility] emergency department on 04/02/2019 under involuntary commitment.  Patient reported that her significant other of over 11 years had informed her that he would be moving out.  There were no dates or anything established here.  Unfortunately the patient found a receipt for a security deposit on a new living area for him.  He lives his moving issues and informed the patient that he would be leaving in approximately a week.  She became significantly distressed about this.  She admitted that she had had abandonment issues in the past, and this was a big trigger for her.  She became acutely upset and started grabbing objects around the house, and was feeling suicidal.  Her significant other notified police, and when they arrived she had a lighter and gasoline in her hand.  The patient stated that she had no previous psychiatric admissions, no previous suicide attempts.  She stated that her posttraumatic stress disorder symptoms derived from having found her mother after the mother had committed suicide when she was 49 years old.  She had previously been followed by therapist, but her current psychiatric medications were being written by a family practitioner.  She stated she had been tried on several SSRI antidepressants, but all of them that she had been on previously led to side effects.  She stated that prior to the events of the last week she  had been doing well, and was not having any problems.  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  She was admitted to the hospital for evaluation and stabilization.  Associated Signs/Symptoms: Depression Symptoms:  depressed mood, anhedonia, insomnia, psychomotor agitation, fatigue, feelings of worthlessness/guilt, difficulty concentrating, hopelessness, suicidal thoughts without plan, anxiety, loss of energy/fatigue, disturbed sleep, (Hypo) Manic Symptoms:  Impulsivity, Irritable Mood, Anxiety Symptoms:  Excessive Worry, Psychotic Symptoms:  Denied PTSD Symptoms: Had a traumatic exposure:  Found her mother dead after a suicide attempt at age 89. Total Time spent with patient: 30 minutes  Past Psychiatric History: Patient has a history of attention deficit disorder as well as posttraumatic stress disorder.  Her PTSD originated from having found her mother dead after the mother had committed suicide.  She is had therapy in the past, and found EMDR to be very effective for her PTSD symptoms.  She was tried on several SSRI antidepressants in the past, but was unable to tolerate any of them.  She also was on amitriptyline at one time for sleep, and that was not effective as well.  She most recently has been receiving her clonazepam, Adderall and trazodone from her primary care provider.  Is the patient at risk to self? Yes.    Has the patient been a risk to self in the past 6 months? No.  Has the patient been a risk to self within the distant past? No.  Is the patient a risk to others? No.  Has the patient been a risk to others in  the past 6 months? No.  Has the patient been a risk to others within the distant past? No.   Prior Inpatient Therapy:   Prior Outpatient Therapy:    Alcohol Screening: 1. How often do you have a drink containing alcohol?: Never 2. How many drinks  containing alcohol do you have on a typical day when you are drinking?: 1 or 2 3. How often do you have six or more drinks on one occasion?: Never AUDIT-C Score: 0 Alcohol Brief Interventions/Follow-up: AUDIT Score <7 follow-up not indicated Substance Abuse History in the last 12 months:  No. Consequences of Substance Abuse: Negative Previous Psychotropic Medications: Yes  Psychological Evaluations: Yes  Past Medical History:  Past Medical History:  Diagnosis Date  . ADD (attention deficit disorder)   . Anxiety disorder   . Heart murmur   . Herpes simplex type 1 infection   . Insomnia    unspecified  . Migraines   . MRSA infection    left great toe  . PTSD (post-traumatic stress disorder)   . Seasonal allergies     Past Surgical History:  Procedure Laterality Date  . REDUCTION MAMMAPLASTY     Family History:  Family History  Problem Relation Age of Onset  . Bipolar disorder Mother   . Lung cancer Father   . Migraines Neg Hx    Family Psychiatric  History: Reportedly mother had bipolar disorder and committed suicide in the past. Tobacco Screening: Have you used any form of tobacco in the last 30 days? (Cigarettes, Smokeless Tobacco, Cigars, and/or Pipes): No Social History:  Social History   Substance and Sexual Activity  Alcohol Use Not Currently   Comment: occasional     Social History   Substance and Sexual Activity  Drug Use No    Additional Social History:                           Allergies:   Allergies  Allergen Reactions  . Sertraline Other (See Comments)    Felt on emotion  . Pepto-Bismol [Bismuth Subsalicylate] Rash  . Septra [Sulfamethoxazole-Trimethoprim] Rash   Lab Results:  Results for orders placed or performed during the hospital encounter of 04/01/19 (from the past 48 hour(s))  Comprehensive metabolic panel     Status: Abnormal   Collection Time: 04/01/19 10:36 PM  Result Value Ref Range   Sodium 137 135 - 145 mmol/L    Potassium 3.3 (L) 3.5 - 5.1 mmol/L   Chloride 101 98 - 111 mmol/L   CO2 26 22 - 32 mmol/L   Glucose, Bld 108 (H) 70 - 99 mg/dL   BUN 7 6 - 20 mg/dL   Creatinine, Ser 4.27 0.44 - 1.00 mg/dL   Calcium 8.8 (L) 8.9 - 10.3 mg/dL   Total Protein 7.5 6.5 - 8.1 g/dL   Albumin 3.9 3.5 - 5.0 g/dL   AST 20 15 - 41 U/L   ALT 14 0 - 44 U/L   Alkaline Phosphatase 36 (L) 38 - 126 U/L   Total Bilirubin 0.4 0.3 - 1.2 mg/dL   GFR calc non Af Amer >60 >60 mL/min   GFR calc Af Amer >60 >60 mL/min   Anion gap 10 5 - 15    Comment: Performed at [facility], [address] [place], [place] [address]  Ethanol     Status: None   Collection Time: 04/01/19 10:36 PM  Result Value Ref Range   Alcohol,  Ethyl (B) <10 <10 mg/dL    Comment: (NOTE) Lowest detectable limit for serum alcohol is 10 mg/dL. For medical purposes only. Performed at [facility], [address] [place], [place] [address]   Salicylate level     Status: Abnormal   Collection Time: 04/01/19 10:36 PM  Result Value Ref Range   Salicylate Lvl <7.0 (L) 7.0 - 30.0 mg/dL    Comment: Performed at [facility], [address] [place], [place] [address]  Acetaminophen level     Status: Abnormal   Collection Time: 04/01/19 10:36 PM  Result Value Ref Range   Acetaminophen (Tylenol), Serum <10 (L) 10 - 30 ug/mL    Comment: (NOTE) Therapeutic concentrations vary significantly. A range of 10-30 ug/mL  may be an effective concentration for many patients. However, some  are best treated at concentrations outside of this range. Acetaminophen concentrations >150 ug/mL at 4 hours after ingestion  and >50 ug/mL at 12 hours after ingestion are often associated with  toxic reactions. Performed at [facility], [address] [place], [place] [address]   cbc     Status: None   Collection Time: 04/01/19 10:36 PM  Result Value Ref Range   WBC 8.5 4.0 - 10.5 K/uL   RBC 4.21  3.87 - 5.11 MIL/uL   Hemoglobin 12.4 12.0 - 15.0 g/dL   HCT 21.337.9 08.636.0 - 57.846.0 %   MCV 90.0 80.0 - 100.0 fL   MCH 29.5 26.0 - 34.0 pg   MCHC 32.7 30.0 - 36.0 g/dL   RDW 46.913.6 62.911.5 - 52.815.5 %   Platelets 340 150 - 400 K/uL   nRBC 0.0 0.0 - 0.2 %    Comment: Performed at [facility], [address] [place], [place] [address]  Rapid urine drug screen (hospital performed)     Status: Abnormal   Collection Time: 04/01/19 10:36 PM  Result Value Ref Range   Opiates NONE DETECTED NONE DETECTED   Cocaine NONE DETECTED NONE DETECTED   Benzodiazepines NONE DETECTED NONE DETECTED   Amphetamines POSITIVE (A) NONE DETECTED   Tetrahydrocannabinol NONE DETECTED NONE DETECTED   Barbiturates NONE DETECTED NONE DETECTED    Comment: (NOTE) DRUG SCREEN FOR MEDICAL PURPOSES ONLY.  IF CONFIRMATION IS NEEDED FOR ANY PURPOSE, NOTIFY LAB WITHIN 5 DAYS. LOWEST DETECTABLE LIMITS FOR URINE DRUG SCREEN Drug Class                     Cutoff (ng/mL) Amphetamine and metabolites    1000 Barbiturate and metabolites    200 Benzodiazepine                 200 Tricyclics and metabolites     300 Opiates and metabolites        300 Cocaine and metabolites        300 THC                            50 Performed at [facility], [address] [place], [place] [address]   I-Stat beta hCG blood, ED     Status: None   Collection Time: 04/01/19 10:44 PM  Result Value Ref Range   I-stat hCG, quantitative <5.0 <5 mIU/mL   Comment 3            Comment:   GEST. AGE      CONC.  (mIU/mL)   <=1 WEEK  5 - 50     2 WEEKS       50 - 500     3 WEEKS       100 - 10,000     4 WEEKS     1,000 - 30,000        FEMALE AND NON-PREGNANT FEMALE:     LESS THAN 5 mIU/mL   Respiratory Panel by RT PCR (Flu A&B, Covid) - Nasopharyngeal Swab     Status: None   Collection Time: 04/02/19 12:17 AM   Specimen: Nasopharyngeal Swab  Result Value Ref Range   SARS Coronavirus 2 by RT PCR NEGATIVE  NEGATIVE    Comment: (NOTE) SARS-CoV-2 target nucleic acids are NOT DETECTED. The SARS-CoV-2 RNA is generally detectable in upper respiratoy specimens during the acute phase of infection. The lowest concentration of SARS-CoV-2 viral copies this assay can detect is 131 copies/mL. A negative result does not preclude SARS-Cov-2 infection and should not be used as the sole basis for treatment or other patient management decisions. A negative result may occur with  improper specimen collection/handling, submission of specimen other than nasopharyngeal swab, presence of viral mutation(s) within the areas targeted by this assay, and inadequate number of viral copies (<131 copies/mL). A negative result must be combined with clinical observations, patient history, and epidemiological information. The expected result is Negative. Fact Sheet for Patients:  PinkCheek.be Fact Sheet for Healthcare Providers:  GravelBags.it This test is not yet ap proved or cleared by the [place] FDA and  has been authorized for detection and/or diagnosis of SARS-CoV-2 by FDA under an Emergency Use Authorization (EUA). This EUA will remain  in effect (meaning this test can be used) for the duration of the COVID-19 declaration under Section 564(b)(1) of the Act, 21 U.S.C. section 360bbb-3(b)(1), unless the authorization is terminated or revoked sooner.    Influenza A by PCR NEGATIVE NEGATIVE   Influenza B by PCR NEGATIVE NEGATIVE    Comment: (NOTE) The Xpert Xpress SARS-CoV-2/FLU/RSV assay is intended as an aid in  the diagnosis of influenza from Nasopharyngeal swab specimens and  should not be used as a sole basis for treatment. Nasal washings and  aspirates are unacceptable for Xpert Xpress SARS-CoV-2/FLU/RSV  testing. Fact Sheet for Patients: PinkCheek.be Fact Sheet for Healthcare  Providers: GravelBags.it This test is not yet approved or cleared by the [place] FDA and  has been authorized for detection and/or diagnosis of SARS-CoV-2 by  FDA under an Emergency Use Authorization (EUA). This EUA will remain  in effect (meaning this test can be used) for the duration of the  Covid-19 declaration under Section 564(b)(1) of the Act, 21  U.S.C. section 360bbb-3(b)(1), unless the authorization is  terminated or revoked. Performed at [facility] [place] [address] [place], [place] [address]     Blood Alcohol level:  Lab Results  Component Value Date   ETH <10 09/98/3382    Metabolic Disorder Labs:  No results found for: HGBA1C, MPG No results found for: PROLACTIN No results found for: CHOL, TRIG, HDL, CHOLHDL, VLDL, LDLCALC  Current Medications: Current Facility-Administered Medications  Medication Dose Route Frequency Provider Last Rate Last Admin  . acetaminophen (TYLENOL) tablet 650 mg  650 mg Oral Q6H PRN Dixon, Rashaun M, NP      . alum & mag hydroxide-simeth (MAALOX/MYLANTA) 200-200-20 MG/5ML suspension 30 mL  30 mL Oral Q4H PRN Dixon, Ernst Bowler, NP      . clonazePAM (KLONOPIN) tablet 1 mg  1 mg Oral BID PRN Antonieta Pert, MD      . escitalopram [facility]) tablet 5 mg  5 mg Oral Daily Antonieta Pert, MD      . hydrOXYzine (ATARAX/VISTARIL) tablet 25 mg  25 mg Oral TID PRN Jearld Lesch, NP      . magnesium hydroxide (MILK OF MAGNESIA) suspension 30 mL  30 mL Oral Daily PRN Dixon, Rashaun M, NP      . traZODone (DESYREL) tablet 100 mg  100 mg Oral QHS Antonieta Pert, MD       PTA Medications: Medications Prior to Admission  Medication Sig Dispense Refill Last Dose  . amphetamine-dextroamphetamine (ADDERALL XR) 20 MG 24 hr capsule Take 40 mg by mouth every morning.   04/01/2019 at Unknown time  . cetirizine (ZYRTEC) 10 MG tablet Take 10 mg by mouth daily.   04/01/2019 at Unknown time  .  clonazePAM (KLONOPIN) 1 MG tablet Take 1 mg by mouth 2 (two) times daily as needed for anxiety.    04/01/2019 at Unknown time  . fluticasone (FLONASE) 50 MCG/ACT nasal spray Place 1 spray daily into both nostrils.   04/01/2019 at Unknown time  . Norethin Ace-Eth Estrad-FE 1-20 MG-MCG(24) CAPS Take 1 capsule by mouth daily.    04/01/2019 at Unknown time  . traZODone (DESYREL) 100 MG tablet Take 100 mg by mouth at bedtime.   04/01/2019 at Unknown time  . valACYclovir (VALTREX) 500 MG tablet Take 500 mg by mouth 2 (two) times daily as needed (flare up).    Past Month at Unknown time  . albuterol (PROVENTIL HFA;VENTOLIN HFA) 108 (90 Base) MCG/ACT inhaler Inhale 2 puffs into the lungs every 6 (six) hours as needed for wheezing or shortness of breath.     . diphenhydrAMINE (BENADRYL) 25 MG tablet Take 25 mg by mouth every 6 (six) hours as needed for allergies.   Not Taking at Unknown time    Musculoskeletal: Strength & Muscle Tone: within normal limits Gait & Station: normal Patient leans: N/A  Psychiatric Specialty Exam: Physical Exam  Nursing note and vitals reviewed. Constitutional: She is oriented to person, place, and time. She appears well-developed and well-nourished.  HENT:  Head: Normocephalic and atraumatic.  Respiratory: Effort normal.  Neurological: She is alert and oriented to person, place, and time.    Review of Systems  Blood pressure (!) 144/83, pulse 96, temperature 98.5 F (36.9 C), temperature source Oral, resp. rate 16, height 5\' 6"  (1.676 m), weight 87.5 kg, SpO2 98 %.Body mass index is 31.15 kg/m.  General Appearance: Disheveled  Eye Contact:  Good  Speech:  Normal Rate  Volume:  Normal  Mood:  Anxious  Affect:  Congruent  Thought Process:  Coherent and Descriptions of Associations: Intact  Orientation:  Full (Time, Place, and Person)  Thought Content:  Logical  Suicidal Thoughts:  No  Homicidal Thoughts:  No  Memory:  Immediate;   Good Recent;   Good Remote;    Good  Judgement:  Intact  Insight:  Fair  Psychomotor Activity:  Increased  Concentration:  Concentration: Good and Attention Span: Good  Recall:  Good  Fund of Knowledge:  Good  Language:  Good  Akathisia:  Negative  Handed:  Right  AIMS (if indicated):     Assets:  Communication Skills Desire for Improvement Financial Resources/Insurance Housing Resilience Talents/Skills Transportation  ADL's:  Intact  Cognition:  WNL  Sleep:       Treatment Plan Summary: Daily contact  with patient to assess and evaluate symptoms and progress in treatment, Medication management and Plan : Patient is seen and examined.  Patient is a 49 year old female with the above-stated past psychiatric history who was admitted after involuntary commitment secondary to concern for her safety.  She will be admitted to the hospital.  She will be integrated into the milieu.  She will be encouraged to attend groups.  We will continue her clonazepam and trazodone at their previous dosages.  We will hold the Adderall for now.  She has agreed to a trial of Lexapro 5 mg p.o. daily.  We will see how that goes.  We will contact her significant other for collateral information, and make sure about her safety as well as arranging follow-up after hospitalization with a psychiatrist as well as her previous therapist.  She had previously been treated with EMDR for her posttraumatic stress disorder symptoms, and did well with that.  Review of her admission laboratories showed essentially normal electrolytes except for a low potassium at 3.3.  CBC was within normal limits.  Beta-hCG was negative.  Blood alcohol, salicylate and acetaminophen were all negative.  Drug screen was positive only for amphetamines.  There was no TSH, and I will order that for completeness sake.  Observation Level/Precautions:  15 minute checks  Laboratory:  TSH  Psychotherapy:    Medications:    Consultations:    Discharge Concerns:    Estimated LOS:   Other:     Physician Treatment Plan for Primary Diagnosis: <principal problem not specified> Long Term Goal(s): Improvement in symptoms so as ready for discharge  Short Term Goals: Ability to identify changes in lifestyle to reduce recurrence of condition will improve, Ability to verbalize feelings will improve, Ability to disclose and discuss suicidal ideas, Ability to demonstrate self-control will improve, Ability to identify and develop effective coping behaviors will improve and Ability to maintain clinical measurements within normal limits will improve  Physician Treatment Plan for Secondary Diagnosis: Active Problems:   PTSD (post-traumatic stress disorder)  Long Term Goal(s): Improvement in symptoms so as ready for discharge  Short Term Goals: Ability to identify changes in lifestyle to reduce recurrence of condition will improve, Ability to verbalize feelings will improve, Ability to disclose and discuss suicidal ideas, Ability to demonstrate self-control will improve, Ability to identify and develop effective coping behaviors will improve and Ability to maintain clinical measurements within normal limits will improve  I certify that inpatient services furnished can reasonably be expected to improve the patient's condition.    Antonieta Pert, MD 1/23/20211:43 PM

## 2019-04-02 NOTE — ED Notes (Signed)
[employer] called for transportation to [facility]

## 2019-04-02 NOTE — BHH Counselor (Signed)
Adult Comprehensive Assessment  Patient ID: Monica [name], female   DOB: 1971-02-05, 49 y.o.   MRN: [id number]  Information Source: Information source: Patient  Current Stressors:  Patient states their primary concerns and needs for treatment are:: Pt reports " an incident from last night that escaled to me being irrational and impulsive" Patient states their goals for this hospitilization and ongoing recovery are:: Pt reports "a more long term goal would be..I had to cope with I guess rejection and communication. I feel rejected by significant other and abandoned. I have been dealing with abandonment for a long time" Educational / Learning stressors: Pt reports none Employment / Job issues: Pt reports none Family Relationships: Pt reports "just that I don't have family here. I am from [place]. Of course with COVID we have not been able to see each other" Financial / Lack of resources (include bankruptcy): Pt reports "Not at the moment but when Monica [name] moves out I will figure it out. I have not really gotten to that point yet. I think I will be okay" Housing / Lack of housing: Pt reports none. Physical health (include injuries & life threatening diseases): Pt reports "I was scheduled to have a DNC and a microblasia. I will talk to the doctors to the new plan out. It's already scheduled" Social relationships: Pt reports none Substance abuse: Pt reports none Bereavement / Loss: Pt reports " I am grieving loosing what I had. I guess my daughter currently went to college too. Now she back to school"  Living/Environment/Situation:  Living Arrangements: Spouse/significant other Living conditions (as described by patient or guardian): Pt reports "Its going to be hard to go home and see him moving his stuff out. I trying to think of alternatives" Who else lives in the home?: Pt reports " Me, Monica [name] and his son" How long has patient lived in current situation?: Pt reports "11 years" What is atmosphere in  current home: Other (Comment)(Pt reports "its been stressful")  Family History:  Marital status: Long term relationship Long term relationship, how long?: Pt reports "11 years" What types of issues is patient dealing with in the relationship?: Pt reports "there is strain on our relationship due to a lack of communication between to the two of [place], childhood trauma, and the stress of coparetning" Are you sexually active?: Yes What is your sexual orientation?: Pt reports "heterosexual" Has your sexual activity been affected by drugs, alcohol, medication, or emotional stress?: Pt reports "probably" Does patient have children?: Yes How many children?: 2(Pt reports having a daughter and a "step son") How is patient's relationship with their children?: Pt reports "We get along, but I know she has a lot of resentiment due to Monica [name] ([name]'s son) needed more immediate attention and his needs needed to be met before hers at times"  Childhood History:  By whom was/is the patient raised?: Mother/father and step-parent Description of patient's relationship with caregiver when they were a child: Pt reports "I don't remeber a lof of my childhood. I rmeber my parents being serated at the time when she was in and out of the hospital. My father remarried to my stepmom. My stepmom and I had a strained relationship in my teens. We got better in my twenties" Patient's description of current relationship with people who raised him/her: Mother and Father are deceased. Mother passed at 68 years old and father passed away about 15 years ago. How were you disciplined when you got in trouble as a child/adolescent?: Pt  reports "I rebelled when I was a teenager. I don't remeber being disciplined. I guess I got grounded. Then I left home at 49 years old" Does patient have siblings?: Yes Number of Siblings: 2 Description of patient's current relationship with siblings: Pt reports  " We did not talk after my dad died. We didnt  talk for almost 6 years but now we have a wonderful relationship" Did patient suffer any verbal/emotional/physical/sexual abuse as a child?: No Did patient suffer from severe childhood neglect?: No Has patient ever been sexually abused/assaulted/raped as an adolescent or adult?: No Was the patient ever a victim of a crime or a disaster?: No Witnessed domestic violence?: No Has patient been effected by domestic violence as an adult?: No  Education:  Highest grade of school patient has completed: Pt reports " I have a Buyer, retail and half of a masters. 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 Everything is locked up and I dont have a key" Are These Weapons Safely Secured?: Yes(Pt reports significant other has they key and she has no access.)  Financial Resources:   Financial resources: Income from employment, Private insurance Does patient have a representative payee or guardian?: No  Alcohol/Substance Abuse:   What has been your use of drugs/alcohol within the last 12 months?: Pt denies hx If attempted suicide, did drugs/alcohol play a role in this?: No Alcohol/Substance Abuse Treatment Hx: Denies past history  Social Support System:   Heritage manager System: Good Describe Community Support System:  Pt reports "There is agroup of [place] that are very very close. I have another friend whose daughter is friends with my daughter. I have known her for years" Type of faith/religion: Pt reports "not as much as it should, but yes. I believe. I was born and raised catholic" How does patient's faith help to cope with current illness?: Pt reports "The last couple of days when [name] told me he was going to move out, I got on my knees and I started praying"  Leisure/Recreation:   Leisure and Hobbies: Pt reports "Pre COVID I loved to travel, I like to read, do puzzle, sit outside, and walk"  Strengths/Needs:   What is the patient's perception of their strengths?: Pt reports "I am good at helping at other people" Patient states they can use these personal strengths during their treatment to contribute to their recovery: Pt reports " I know about skills but I have used them on myself yet" Patient states these barriers may affect/interfere with their treatment: Pt reports none Patient states these barriers may affect their return to the community: Pt reports " I guess I have to figure out if he is going to be there when I get there or if I need to go to a friends"  Discharge Plan:   Currently receiving community mental health services: Yes (From Whom)(Pt  reports "[facility] [place]") Patient states concerns and preferences for aftercare planning are: Pt reports "I am in the process of being connected with [facility]" Patient states they will know when they are safe and ready for discharge when: Pt reports "I feel like I could go home now. I don't want to hurt myself.I have too many things to live for. Yesterday was mu lowest point and I don't want to be there again" Does patient have access to transportation?: Yes Does patient have financial barriers related to discharge medications?: No Will patient be returning to same living situation after discharge?:  Yes  Summary/Recommendations:   Summary and Recommendations (to be completed by the evaluator): Cincere is a 49 year old female from [place], [place] ([place]). She is employed [employer] and is insured with [employer]. She presented to the emergency department by [employer] under IVC. Her has a primary diagnosis of PTSD (post-traumatic stress disorder). Recommendations include: crisis stabilization, therapeutic milieu, encourage group attendance and participation, medication management for detox/mood stabilization and development of comprehensive mental wellness/sobriety plan.  [place], LCSWA. 04/02/2019

## 2019-04-02 NOTE — Tx Team (Signed)
Initial Treatment Plan 04/02/2019 11:52 AM Monica [name] WTG:[id number]    PATIENT STRESSORS: Financial difficulties Traumatic event Other: Recent loss of relatonship   PATIENT STRENGTHS: Ability for insight Active sense of humor Average or above average intelligence   PATIENT IDENTIFIED PROBLEMS: Suicidal Ideations  Ineffective coping skills.                    DISCHARGE CRITERIA:  Ability to meet basic life and health needs Adequate post-discharge living arrangements Improved stabilization in mood, thinking, and/or behavior  PRELIMINARY DISCHARGE PLAN: Return to previous living arrangement Return to previous work or school arrangements  PATIENT/FAMILY INVOLVEMENT: This treatment plan has been presented to and reviewed with the patient, Monica [name].  The patient and family have been given the opportunity to ask questions and make suggestions.  Berkley Harvey, RN 04/02/2019, 11:52 AM

## 2019-04-02 NOTE — ED Notes (Signed)
Pt to be taken to [facility] room 307 at 0800. Report given to nurse at [facility] at 0340.

## 2019-04-02 NOTE — ED Notes (Signed)
Patient discharged with [facility] Transport Team x 2 officers to [employer]

## 2019-04-02 NOTE — BH Assessment (Signed)
BED AVAILABLE AFTER 8:00AM  Patient has been accepted to [facility].  Accepting Nurse Practioner is Lerry Liner by the way of Dr. Toni Amend.  Attending  Physician will be Dr. Toni Amend.  Patient has been assigned to room 307, by [facility] Charge Nurse [place].   Call report to [phone].  Representative/Transfer Coordinator is Joann Patient pre-admitted by [facility] Patient Access Cordelia Pen)  [facility] Staff Janine Limbo, TTS) made aware of acceptance. [facility] Staff Chyrl Civatte, [facility]) made aware of acceptance.

## 2019-04-02 NOTE — ED Notes (Signed)
Pt. Made a phone call to sister, twice.

## 2019-04-02 NOTE — BHH Suicide Risk Assessment (Signed)
[facility] Admission Suicide Risk Assessment   Nursing information obtained from:    Demographic factors:    Current Mental Status:    Loss Factors:    Historical Factors:    Risk Reduction Factors:     Total Time spent with patient: 30 minutes Principal Problem: <principal problem not specified> Diagnosis:  Active Problems:   PTSD (post-traumatic stress disorder)  Subjective Data: Patient is seen and examined.  Patient is a 49 year old female with a past psychiatric history significant for posttraumatic stress disorder as well as attention deficit disorder who presented to the [facility] emergency department on 04/02/2019 under involuntary commitment.  Patient reported that her significant other of over 11 years had informed her that he would be moving out.  There were no dates or anything established here.  Unfortunately the patient found a receipt for a security deposit on a new living area for him.  He lives his moving issues and informed the patient that he would be leaving in approximately a week.  She became significantly distressed about this.  She admitted that she had had abandonment issues in the past, and this was a big trigger for her.  She became acutely upset and started grabbing objects around the house, and was feeling suicidal.  Her significant other notified police, and when they arrived she had a lighter and gasoline in her hand.  The patient stated that she had no previous psychiatric admissions, no previous suicide attempts.  She stated that her posttraumatic stress disorder symptoms derived from having found her mother after the mother had committed suicide when she was 49 years old.  She had previously been followed by therapist, but her current psychiatric medications were being written by a family practitioner.  She stated she had been tried on several SSRI antidepressants, but all of them that she had been on previously led to side effects.  She stated that prior  to the events of the last week she had been doing well, and was not having any problems.  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  She was admitted to the hospital for evaluation and stabilization.  Continued Clinical Symptoms:    The "Alcohol Use Disorders Identification Test", Guidelines for Use in Primary Care, Second Edition.  World Science writer [facility]). Score between 0-7:  no or low risk or alcohol related problems. Score between 8-15:  moderate risk of alcohol related problems. Score between 16-19:  high risk of alcohol related problems. Score 20 or above:  warrants further diagnostic evaluation for alcohol dependence and treatment.   CLINICAL FACTORS:   Depression:   Anhedonia Hopelessness Impulsivity Insomnia More than one psychiatric diagnosis Previous Psychiatric Diagnoses and Treatments   Musculoskeletal: Strength & Muscle Tone: within normal limits Gait & Station: normal Patient leans: N/A  Psychiatric Specialty Exam: Physical Exam  Nursing note and vitals reviewed. Constitutional: She is oriented to person, place, and time. She appears well-developed and well-nourished.  HENT:  Head: Normocephalic and atraumatic.  Respiratory: Effort normal.  Neurological: She is alert and oriented to person, place, and time.    Review of Systems  Blood pressure (!) 144/83, pulse 96, temperature 98.5 F (36.9 C), temperature source Oral, resp. rate 16, height 5\' 6"  (1.676 m), weight 87.5 kg, SpO2 98 %.Body mass index is 31.15 kg/m.  General Appearance: Disheveled  Eye Contact:  Good  Speech:  Normal Rate  Volume:  Normal  Mood:  Anxious  Affect:  Congruent  Thought Process:  Coherent and Descriptions of Associations: Intact  Orientation:  Full (Time, Place, and Person)  Thought Content:  Logical  Suicidal Thoughts:  No  Homicidal Thoughts:  No  Memory:   Immediate;   Good Recent;   Good Remote;   Good  Judgement:  Intact  Insight:  Fair  Psychomotor Activity:  Increased  Concentration:  Concentration: Good and Attention Span: Good  Recall:  Good  Fund of Knowledge:  Good  Language:  Good  Akathisia:  Negative  Handed:  Right  AIMS (if indicated):     Assets:  Communication Skills Desire for Improvement Financial Resources/Insurance Housing Resilience Talents/Skills Transportation Vocational/Educational  ADL's:  Intact  Cognition:  WNL  Sleep:         COGNITIVE FEATURES THAT CONTRIBUTE TO RISK:  None    SUICIDE RISK:   Mild:  Suicidal ideation of limited frequency, intensity, duration, and specificity.  There are no identifiable plans, no associated intent, mild dysphoria and related symptoms, good self-control (both objective and subjective assessment), few other risk factors, and identifiable protective factors, including available and accessible social support.  PLAN OF CARE: Patient is seen and examined.  Patient is a 49 year old female with the above-stated past psychiatric history who was admitted after involuntary commitment secondary to concern for her safety.  She will be admitted to the hospital.  She will be integrated into the milieu.  She will be encouraged to attend groups.  We will continue her clonazepam and trazodone at their previous dosages.  We will hold the Adderall for now.  She has agreed to a trial of Lexapro 5 mg p.o. daily.  We will see how that goes.  We will contact her significant other for collateral information, and make sure about her safety as well as arranging follow-up after hospitalization with a psychiatrist as well as her previous therapist.  She had previously been treated with EMDR for her posttraumatic stress disorder symptoms, and did well with that.  Review of her admission laboratories showed essentially normal electrolytes except for a low potassium at 3.3.  CBC was within normal limits.   Beta-hCG was negative.  Blood alcohol, salicylate and acetaminophen were all negative.  Drug screen was positive only for amphetamines.  There was no TSH, and I will order that for completeness sake.  I certify that inpatient services furnished can reasonably be expected to improve the patient's condition.   Sharma Covert, MD 04/02/2019, 1:31 PM

## 2019-04-02 NOTE — ED Notes (Signed)
Pt.s breakfast has arrived. 

## 2019-04-02 NOTE — ED Notes (Signed)
[facility] department called and stated the patient needed to be transported by the [facility] department transport team [phone]). Transport team called and message left.

## 2019-04-02 NOTE — Progress Notes (Signed)
Patient admitted to unit with sad flat affect. Denies any SI, HI, AVH. Reports had a moment when fiance told her she was moving out. Patient voices she had or has no intentions of Suicide. Patient reports feeling better today than yesterday. Patient reports never been hospitalized for mental health before. Was stated that patient has some PTSD from childhood, found mother dead from suicide. Patient denies any abuse. Skin and contraband search completed and witnessed by Lorenda Hatchet, Charity fundraiser. No skin issues noted, no contraband found. Oriented patient to room and unit. Fluids and nutrition offered. Patient remains safe on unit with q 15 min checks.

## 2019-04-02 NOTE — BHH Group Notes (Signed)
BHH LCSW Group Therapy Note   Date and Time: 04/02/2019 1:00pm  Type of Group and Topic: Psychoeducational Group: Discharge Planning and Individual Wellness  Participation Level: BHH PARTICIPATION LEVEL: Did Not Attend    Description of Group: Discharge planning group reviews patient's anticipated discharge plans and assists patients to anticipate and address any barriers to wellness/recovery in the community. Suicide prevention education is reviewed with patients in group. Therapeutic Goals 1. Patients will state their anticipated discharge plan and mental health aftercare 2. Patients will identify potential barriers to wellness in the community setting 3. Patients will engage in problem solving, solution focused discussion of ways to anticipate and address barriers to wellness/recovery     Summary of Patient Progress:  X  Plan for Discharge/Comments:    Transportation Means:   Supports:     Therapeutic Modalities: Motivational Interviewing and Psycho-Education  Mont Latonja Bobeck, MSW, LCSWA Clinical Social Worker  
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
## 2019-04-02 NOTE — [facility] Suicide Risk Assessment (Signed)
[facility] INPATIENT:  Family/Significant Other Suicide Prevention Education  Suicide Prevention Education:  Patient Refusal for Family/Significant Other Suicide Prevention Education: The patient Monica [name] has refused to provide written consent for family/significant other to be provided Family/Significant Other Suicide Prevention Education during admission and/or prior to discharge.  Physician notified.  Jimmey Ralph, LCSWA 04/02/2019, 4:41 PM

## 2019-04-03 LAB — TSH: TSH: 1.131 u[IU]/mL (ref 0.350–4.500)

## 2019-04-03 MED ORDER — FLUTICASONE PROPIONATE 50 MCG/ACT NA SUSP
1.0000 | Freq: Every day | NASAL | Status: DC
  Administered 2019-04-03 – 2019-04-04 (×2): 1 via NASAL
  Filled 2019-04-03: qty 16

## 2019-04-03 NOTE — Progress Notes (Signed)
Patient pleasant and cooperative with care. Medication compliant. Appropriate with staff and peers. Denies Si, Hi, Avh. Reports sleeping well without sleep aid. Reports appetite fair, energy level normal, and concentration good. Rates depression at 2/10, hopelessness 0, and anxiety 2/10. Patient did not request any anti-anxiety meds. Patient goal for today is to continue processing, grieving and coping. Plan is to go to group and develop a list of coping skills. Encouragement and support provided. Medications given as prescribed. Pt receptive and remains safe on unit with q 15 min checks.

## 2019-04-03 NOTE — Progress Notes (Signed)
[facility] MD Progress Note  04/03/2019 10:43 AM Monica [name]  MRN:  [id number] Subjective: Patient is a 49 year old female with a past psychiatric history significant for posttraumatic stress disorder as well as attention deficit disorder who presented to the [facility] emergency department on 04/02/2019 under involuntary commitment.  She had had a recent relationship break-up, and had an impulsive reaction to kill her self.  Objective: Patient is seen and examined.  Patient is a 49 year old female with the above-stated past psychiatric history is seen in follow-up.  She looks much better today.  She slept better last night.  She denied any suicidal ideation.  She stated she is coping with the change in loss of relationship.  She is requesting discharge.  She tolerated the Lexapro without difficulty, and denied any side effects to it.  Her vital signs are stable, she is afebrile.  She slept 7.75 hours last night.  We discussed her Adderall.  She stated that sometimes in the past she had gotten headaches from stopping it, but was not having any problems with it today.  She did request something for seasonal allergies.  She was taking Zyrtec at home, but that is not on the formulary here.  She stated she was able to tolerate Flonase.  I told her I would write for that.  Her TSH came back at 1.131 which is normal.  Principal Problem: <principal problem not specified> Diagnosis: Active Problems:   PTSD (post-traumatic stress disorder)  Total Time spent with patient: 20 minutes  Past Psychiatric History: See admission H&P  Past Medical History:  Past Medical History:  Diagnosis Date  . ADD (attention deficit disorder)   . Anxiety disorder   . Heart murmur   . Herpes simplex type 1 infection   . Insomnia    unspecified  . Migraines   . MRSA infection    left great toe  . PTSD (post-traumatic stress disorder)   . Seasonal allergies     Past Surgical History:  Procedure  Laterality Date  . REDUCTION MAMMAPLASTY     Family History:  Family History  Problem Relation Age of Onset  . Bipolar disorder Mother   . Lung cancer Father   . Migraines Neg Hx    Family Psychiatric  History: See admission H&P Social History:  Social History   Substance and Sexual Activity  Alcohol Use Not Currently   Comment: occasional     Social History   Substance and Sexual Activity  Drug Use No    Social History   Socioeconomic History  . Marital status: Divorced    Spouse name: Not on file  . Number of children: 1  . Years of education: Not on file  . Highest education level: Bachelor's degree (e.g., BA, AB, BS)  Occupational History  . Not on file  Tobacco Use  . Smoking status: Former Smoker    Types: Cigarettes    Quit date: 07/30/2011    Years since quitting: 7.6  . Smokeless tobacco: Never Used  Substance and Sexual Activity  . Alcohol use: Not Currently    Comment: occasional  . Drug use: No  . Sexual activity: Not on file  Other Topics Concern  . Not on file  Social History Narrative   Lives at home with fiance   Right handed   2 cups of caffeine daily   Social Determinants of Health   Financial Resource Strain:   . Difficulty of Paying Living Expenses: Not on file  Food Insecurity:   . Worried About Programme researcher, broadcasting/film/video in the Last Year: Not on file  . Ran Out of Food in the Last Year: Not on file  Transportation Needs:   . Lack of Transportation (Medical): Not on file  . Lack of Transportation (Non-Medical): Not on file  Physical Activity:   . Days of Exercise per Week: Not on file  . Minutes of Exercise per Session: Not on file  Stress:   . Feeling of Stress : Not on file  Social Connections:   . Frequency of Communication with Friends and Family: Not on file  . Frequency of Social Gatherings with Friends and Family: Not on file  . Attends Religious Services: Not on file  . Active Member of Clubs or Organizations: Not on file  .  Attends Banker Meetings: Not on file  . Marital Status: Not on file   Additional Social History:                         Sleep: Good  Appetite:  Fair  Current Medications: Current Facility-Administered Medications  Medication Dose Route Frequency Provider Last Rate Last Admin  . acetaminophen (TYLENOL) tablet 650 mg  650 mg Oral Q6H PRN Dixon, Rashaun M, NP      . alum & mag hydroxide-simeth (MAALOX/MYLANTA) 200-200-20 MG/5ML suspension 30 mL  30 mL Oral Q4H PRN Dixon, Elray Buba, NP      . clonazePAM (KLONOPIN) tablet 1 mg  1 mg Oral BID PRN Antonieta Pert, MD      . escitalopram (LEXAPRO) tablet 5 mg  5 mg Oral Daily Antonieta Pert, MD   5 mg at 04/03/19 3646  . hydrOXYzine (ATARAX/VISTARIL) tablet 25 mg  25 mg Oral TID PRN Jearld Lesch, NP      . magnesium hydroxide (MILK OF MAGNESIA) suspension 30 mL  30 mL Oral Daily PRN Dixon, Rashaun M, NP      . traZODone (DESYREL) tablet 100 mg  100 mg Oral QHS Antonieta Pert, MD   100 mg at 04/02/19 2135    Lab Results:  Results for orders placed or performed during the hospital encounter of 04/02/19 (from the past 48 hour(s))  TSH     Status: None   Collection Time: 04/03/19  6:30 AM  Result Value Ref Range   TSH 1.131 0.350 - 4.500 uIU/mL    Comment: Performed by a 3rd Generation assay with a functional sensitivity of <=0.01 uIU/mL. Performed at [facility], [address] [place], [place] [address]     Blood Alcohol level:  Lab Results  Component Value Date   [facility] <10 04/01/2019    Metabolic Disorder Labs: No results found for: HGBA1C, MPG No results found for: PROLACTIN No results found for: CHOL, TRIG, HDL, CHOLHDL, VLDL, LDLCALC  Physical Findings: AIMS: Facial and Oral Movements Muscles of Facial Expression: None, normal Lips and Perioral Area: None, normal Jaw: None, normal Tongue: None, normal,Extremity Movements Upper (arms, wrists, hands, fingers): None,  normal Lower (legs, knees, ankles, toes): None, normal, Trunk Movements Neck, shoulders, hips: None, normal, Overall Severity Severity of abnormal movements (highest score from questions above): None, normal Incapacitation due to abnormal movements: None, normal Patient's awareness of abnormal movements (rate only patient's report): No Awareness, Dental Status Current problems with teeth and/or dentures?: No Does patient usually wear dentures?: No  CIWA:    COWS:     Musculoskeletal: Strength &  Muscle Tone: within normal limits Gait & Station: normal Patient leans: N/A  Psychiatric Specialty Exam: Physical Exam  Nursing note and vitals reviewed. Constitutional: She is oriented to person, place, and time. She appears well-developed and well-nourished.  HENT:  Head: Normocephalic and atraumatic.  Respiratory: Effort normal.  Neurological: She is alert and oriented to person, place, and time.    Review of Systems  Blood pressure 122/72, pulse 86, temperature 98 F (36.7 C), temperature source Oral, resp. rate 18, height 5\' 6"  (1.676 m), weight 87.5 kg, SpO2 95 %.Body mass index is 31.15 kg/m.  General Appearance: Casual  Eye Contact:  Good  Speech:  Normal Rate  Volume:  Normal  Mood:  Euthymic  Affect:  Congruent  Thought Process:  Coherent and Descriptions of Associations: Intact  Orientation:  Full (Time, Place, and Person)  Thought Content:  Logical  Suicidal Thoughts:  No  Homicidal Thoughts:  No  Memory:  Immediate;   Good Recent;   Good Remote;   Good  Judgement:  Intact  Insight:  Good  Psychomotor Activity:  Normal  Concentration:  Concentration: Good and Attention Span: Good  Recall:  Good  Fund of Knowledge:  Good  Language:  Good  Akathisia:  Negative  Handed:  Right  AIMS (if indicated):     Assets:  Communication Skills Desire for Improvement Financial Resources/Insurance Housing Resilience Talents/Skills Transportation Vocational/Educational   ADL's:  Intact  Cognition:  WNL  Sleep:  Number of Hours: 7.75     Treatment Plan Summary: Daily contact with patient to assess and evaluate symptoms and progress in treatment, Medication management and Plan : Patient is seen and examined.  Patient is a 49 year old female with the above-stated past psychiatric history who is seen in follow-up.   Diagnosis: #1 acute stress reaction, #2 posttraumatic stress disorder, #3 attention deficit disorder, #4 seasonal allergies  Patient is seen in follow-up.  She is doing well.  She is not having any problems with Lexapro.  She denied any suicidal or homicidal ideation.  She is coping better with the loss of the relationship.  I will add the Flonase today, but no other changes in her medicines.  I anticipate she will be ready to be discharged in 1 or 2 days.  1.  Continue clonazepam 1 mg p.o. twice daily as needed anxiety. 2.  Continue Lexapro 5 mg p.o. daily for anxiety and depression. 3.  Continue hydroxyzine 25 mg p.o. 3 times daily as needed anxiety or allergy symptoms. 4.  Continue trazodone 100 mg p.o. nightly for insomnia. 5.  Add Flonase 2 sprays in each nostril daily for seasonal allergies. 6.  Continue to hold Adderall for now. 7.  Disposition planning-in progress.  53, MD 04/03/2019, 10:43 AM

## 2019-04-03 NOTE — Plan of Care (Signed)
  Problem: Education: Goal: Emotional status will improve Outcome: Progressing Goal: Mental status will improve Outcome: Progressing Goal: Verbalization of understanding the information provided will improve Outcome: Progressing   Problem: Safety: Goal: Periods of time without injury will increase Outcome: Progressing   

## 2019-04-03 NOTE — Progress Notes (Signed)
D: Patient has been isolative to room. Calm and cooperative. Denies SI, HI and AVH. Mood is sad. Affect appropriate to circumstance. A: Continue to monitor for safety R: Safety maintained.

## 2019-04-03 NOTE — [facility] Group Notes (Signed)
LCSW Group Therapy Note  04/03/2019 1:15pm  Type of Therapy and Topic:  Group Therapy:  Cognitive Distortions  Participation Level:  Active   Description of Group:    Patients in this group will be introduced to the topic of cognitive distortions.  Patients will identify and describe cognitive distortions, describe the feelings these distortions create for them.  Patients will identify one or more situations in their personal life where they have cognitively distorted thinking and will verbalize challenging this cognitive distortion through positive thinking skills.  Patients will practice the skill of using positive affirmations to challenge cognitive distortions using affirmation cards.    Therapeutic Goals:  1. Patient will identify two or more cognitive distortions they have used 2. Patient will identify one or more emotions that stem from use of a cognitive distortion 3. Patient will demonstrate use of a positive affirmation to counter a cognitive distortion through discussion and/or role play. 4. Patient will describe one way cognitive distortions can be detrimental to wellness   Summary of Patient Progress: The patient scored her "mood." Patients were introduced to the topic of cognitive distortions. The patient was able to identify and describe cognitive distortions, described the feelings these distortions create for her.  The patient shared that she uses "emotional reasoning and magnification" often. Patient identified a situation in her personal life where she has cognitively distorted thinking. The patient verbalized and challenged this cognitive distortion through positive thinking skills. Patient was able to provide support and validation to other group members.      Therapeutic Modalities:   Cognitive Behavioral Therapy Motivational Interviewing   Anastazia Creek  CUEBAS-COLON, LCSW 04/03/2019 12:52 PM

## 2019-04-03 NOTE — Plan of Care (Signed)
  Problem: Education: Goal: Knowledge of [facility] Education information/materials will improve Outcome: Progressing Goal: Emotional status will improve Outcome: Progressing Goal: Mental status will improve Outcome: Progressing Goal: Verbalization of understanding the information provided will improve Outcome: Progressing  D: Patient has been isolative to room. Calm and cooperative. Denies SI, HI and AVH. Mood is sad. Affect appropriate to circumstance. A: Continue to monitor for safety R: Safety maintained.

## 2019-04-04 DIAGNOSIS — F4325 Adjustment disorder with mixed disturbance of emotions and conduct: Principal | ICD-10-CM

## 2019-04-04 MED ORDER — ESCITALOPRAM OXALATE 5 MG PO TABS: 5.0000 mg | ORAL_TABLET | Freq: Every day | ORAL | 1 refills | Status: DC

## 2019-04-04 NOTE — Progress Notes (Signed)
Patient denies SI/HI, denies A/V hallucinations. Patient verbalizes understanding of discharge instructions, follow up care and prescriptions. Patient given all belongings from [facility] locker. Patient escorted out by staff, transported by family. 

## 2019-04-04 NOTE — Progress Notes (Signed)
D: patient has been pleasant calm and cooperative. Denies SI, HI and AVH A: Continue to monitor for safety R: Safety maintained.

## 2019-04-04 NOTE — BHH Counselor (Signed)
CSW called to schedule appointment with pt's identified mental health provider, however had to leave HIPAA compliant voicemail requesting a return phone call.  Penni Homans, MSW, LCSW 04/04/2019 9:51 AM

## 2019-04-04 NOTE — Progress Notes (Signed)
  [facility] Adult Case Management Discharge Plan :  Will you be returning to the same living situation after discharge:  Yes,  home At discharge, do you have transportation home?: Yes,  a friend will pick her up Do you have the ability to pay for your medications: Yes,  BCBS insurance  Release of information consent forms completed and in the chart;    Patient to Follow up at: Follow-up Information    Group, [facility].   Specialty: Behavioral Health Why: Please call the office at discharge to schedule a follow up appointment. You are currently not set up in their system and they need to gather more information from you. Thank You! Contact information: [address] [place] [address] [phone]           Next level of care provider has access to [facility] Link:no  Safety Planning and Suicide Prevention discussed: Yes,  SPE completed with pt as pt declined collateral contact  Have you used any form of tobacco in the last 30 days? (Cigarettes, Smokeless Tobacco, Cigars, and/or Pipes): No  Has patient been referred to the Quitline?: N/A patient is not a smoker  Patient has been referred for addiction treatment: N/A  Mechele Dawley, LCSW 04/04/2019, 1:06 PM

## 2019-04-04 NOTE — Plan of Care (Signed)
  Problem: Education: Goal: Knowledge of [facility] Education information/materials will improve Outcome: Progressing Goal: Emotional status will improve Outcome: Progressing Goal: Mental status will improve Outcome: Progressing Goal: Verbalization of understanding the information provided will improve Outcome: Progressing  D: patient has been pleasant calm and cooperative. Denies SI, HI and AVH A: Continue to monitor for safety R: Safety maintained.

## 2019-04-04 NOTE — Progress Notes (Signed)
Recreation Therapy Notes  Date: 04/04/2019  Time: 9:30 am  Location: Craft room  Behavioral response: Appropriate  Intervention Topic: Goals   Discussion/Intervention:  Group content on today was focused on goals. Patients described what goals are and how they define goals. Individuals expressed how they go about setting goals and reaching them. The group identified how important goals are and if they make short term goals to reach long term goals. Patients described how many goals they work on at a time and what affects them not reaching their goal. Individuals described how much time they put into planning and obtaining their goals. The group participated in the intervention "My Goal Board" and made personal goal boards to help them achieve their goal. Clinical Observations/Feedback:  Patient came to group late due to unknown reasons. Individual was social with peers and staff while participating in the intervention during group.  Kirstan Fentress LRT/CTRS         Trammell Bowden 04/04/2019 12:31 PM

## 2019-04-04 NOTE — [facility]HH Suicide Risk Assessment (Signed)
[facility] Discharge Suicide Risk Assessment   Principal Problem: Adjustment disorder with mixed disturbance of emotions and conduct Discharge Diagnoses: Principal Problem:   Adjustment disorder with mixed disturbance of emotions and conduct Active Problems:   PTSD (post-traumatic stress disorder)   Total Time spent with patient: 30 minutes  Musculoskeletal: Strength & Muscle Tone: within normal limits Gait & Station: normal Patient leans: N/A  Psychiatric Specialty Exam: Review of Systems  Constitutional: Negative.   HENT: Negative.   Eyes: Negative.   Respiratory: Negative.   Cardiovascular: Negative.   Gastrointestinal: Negative.   Musculoskeletal: Negative.   Skin: Negative.   Neurological: Negative.   Psychiatric/Behavioral: Negative.     Blood pressure 122/72, pulse 78, temperature 99.2 F (37.3 C), temperature source Oral, resp. rate 18, height 5\' 6"  (1.676 m), weight 87.5 kg, SpO2 97 %.Body mass index is 31.15 kg/m.  General Appearance: Casual  Eye Contact::  Fair  Speech:  Normal Rate409  Volume:  Normal  Mood:  Dysphoric  Affect:  Appropriate  Thought Process:  Coherent  Orientation:  Full (Time, Place, and Person)  Thought Content:  Logical  Suicidal Thoughts:  No  Homicidal Thoughts:  No  Memory:  Immediate;   Fair Recent;   Fair Remote;   Fair  Judgement:  Fair  Insight:  Fair  Psychomotor Activity:  Normal  Concentration:  Fair  Recall:  [id number] of Knowledge:Fair  Language: Fair  Akathisia:  No  Handed:  Right  AIMS (if indicated):     Assets:  Desire for Improvement Housing Physical Health Resilience  Sleep:  Number of Hours: 7.75  Cognition: WNL  ADL's:  Intact   Mental Status Per Nursing Assessment::   On Admission:  NA  Demographic Factors:  Caucasian  Loss Factors: Loss of significant relationship  Historical Factors: NA  Risk Reduction Factors:   Sense of responsibility to family, Employed, Positive social support and  Positive therapeutic relationship  Continued Clinical Symptoms:  Depression:   Anhedonia  Cognitive Features That Contribute To Risk:  None    Suicide Risk:  Minimal: No identifiable suicidal ideation.  Patients presenting with no risk factors but with morbid ruminations; may be classified as minimal risk based on the severity of the depressive symptoms  Follow-up Information    Group, [facility].   Specialty: [facility] information: [address] [place] [phone]           Plan Of Care/Follow-up recommendations:  Activity:  Activity as tolerated Diet:  Regular diet Other:  Follow-up with outpatient treatment as previously scheduled  [phone], MD 04/04/2019, 11:33 AM

## 2019-04-04 NOTE — Discharge Summary (Signed)
Physician Discharge Summary Note  Patient:  Monica [name] is an 49 y.o., female MRN:  [id number] DOB:  [birth year] Patient phone:  [phone] (home)  Patient address:   [address] [place] [address],  Total Time spent with patient: 30 minutes  Date of Admission:  04/02/2019 Date of Discharge: April 04, 2019  Reason for Admission: Admitted through the emergency room where she presented after having impulsive suicidal ideation in the context of a break-up of a relationship.  Principal Problem: Adjustment disorder with mixed disturbance of emotions and conduct Discharge Diagnoses: Principal Problem:   Adjustment disorder with mixed disturbance of emotions and conduct Active Problems:   PTSD (post-traumatic stress disorder)   Past Psychiatric History: Patient has a history of depression and anxiety with a diagnosis of PTSD.  No previous hospitalization  Past Medical History:  Past Medical History:  Diagnosis Date  . ADD (attention deficit disorder)   . Anxiety disorder   . Heart murmur   . Herpes simplex type 1 infection   . Insomnia    unspecified  . Migraines   . MRSA infection    left great toe  . PTSD (post-traumatic stress disorder)   . Seasonal allergies     Past Surgical History:  Procedure Laterality Date  . REDUCTION MAMMAPLASTY     Family History:  Family History  Problem Relation Age of Onset  . Bipolar disorder Mother   . Lung cancer Father   . Migraines Neg Hx    Family Psychiatric  History: None reported Social History:  Social History   Substance and Sexual Activity  Alcohol Use Not Currently   Comment: occasional     Social History   Substance and Sexual Activity  Drug Use No    Social History   Socioeconomic History  . Marital status: Divorced    Spouse name: Not on file  . Number of children: 1  . Years of education: Not on file  . Highest education level: Bachelor's degree (e.g., BA, AB, BS)  Occupational History  .  Not on file  Tobacco Use  . Smoking status: Former Smoker    Types: Cigarettes    Quit date: 07/30/2011    Years since quitting: 7.6  . Smokeless tobacco: Never Used  Substance and Sexual Activity  . Alcohol use: Not Currently    Comment: occasional  . Drug use: No  . Sexual activity: Not on file  Other Topics Concern  . Not on file  Social History Narrative   Lives at home with fiance   Right handed   2 cups of caffeine daily   Social Determinants of Health   Financial Resource Strain:   . Difficulty of Paying Living Expenses: Not on file  Food Insecurity:   . Worried About Programme researcher, broadcasting/film/video in the Last Year: Not on file  . Ran Out of Food in the Last Year: Not on file  Transportation Needs:   . Lack of Transportation (Medical): Not on file  . Lack of Transportation (Non-Medical): Not on file  Physical Activity:   . Days of Exercise per Week: Not on file  . Minutes of Exercise per Session: Not on file  Stress:   . Feeling of Stress : Not on file  Social Connections:   . Frequency of Communication with Friends and Family: Not on file  . Frequency of Social Gatherings with Friends and Family: Not on file  . Attends Religious Services: Not on file  . Active  Member of Clubs or Organizations: Not on file  . Attends Banker Meetings: Not on file  . Marital Status: Not on file    Hospital Course: Patient was kept on 15-minute checks.  Patient did not display dangerous aggressive or suicidal behavior in the hospital.  She was started on 5 mg of S-Citalopram for chronic anxiety PTSD and depression and tolerated that without difficulties.  On reassessment the patient states that she feels safe to go home.  She is not having any current suicidal ideation.  She has a clear plan to go stay in [place] near her daughter for a few days until her boyfriend moves out of her apartment at which time she can move back.  She will be able to continue work until then.  She has  follow-up appointments already in place in [place]  Physical Findings: AIMS: Facial and Oral Movements Muscles of Facial Expression: None, normal Lips and Perioral Area: None, normal Jaw: None, normal Tongue: None, normal,Extremity Movements Upper (arms, wrists, hands, fingers): None, normal Lower (legs, knees, ankles, toes): None, normal, Trunk Movements Neck, shoulders, hips: None, normal, Overall Severity Severity of abnormal movements (highest score from questions above): None, normal Incapacitation due to abnormal movements: None, normal Patient's awareness of abnormal movements (rate only patient's report): No Awareness, Dental Status Current problems with teeth and/or dentures?: No Does patient usually wear dentures?: No  CIWA:    COWS:     Musculoskeletal: Strength & Muscle Tone: within normal limits Gait & Station: normal Patient leans: N/A  Psychiatric Specialty Exam: Physical Exam  Nursing note and vitals reviewed. Constitutional: She appears well-developed and well-nourished.  HENT:  Head: Normocephalic and atraumatic.  Eyes: Pupils are equal, round, and reactive to light. Conjunctivae are normal.  Cardiovascular: Regular rhythm and normal heart sounds.  Respiratory: Effort normal. No respiratory distress.  GI: Soft.  Musculoskeletal:        General: Normal range of motion.     Cervical back: Normal range of motion.  Neurological: She is alert.  Skin: Skin is warm and dry.  Psychiatric: Her speech is normal and behavior is normal. Judgment and thought content normal. Her affect is blunt. Cognition and memory are normal.    Review of Systems  Constitutional: Negative.   HENT: Negative.   Eyes: Negative.   Respiratory: Negative.   Cardiovascular: Negative.   Gastrointestinal: Negative.   Musculoskeletal: Negative.   Skin: Negative.   Neurological: Negative.   Psychiatric/Behavioral: Positive for dysphoric mood. Negative for self-injury and suicidal  ideas.    Blood pressure 122/72, pulse 78, temperature 99.2 F (37.3 C), temperature source Oral, resp. rate 18, height 5\' 6"  (1.676 m), weight 87.5 kg, SpO2 97 %.Body mass index is 31.15 kg/m.  General Appearance: Casual  Eye Contact:  Good  Speech:  Clear and Coherent  Volume:  Normal  Mood:  Euthymic  Affect:  Congruent  Thought Process:  Goal Directed  Orientation:  Full (Time, Place, and Person)  Thought Content:  Logical  Suicidal Thoughts:  No  Homicidal Thoughts:  No  Memory:  Immediate;   Fair Recent;   Fair Remote;   Fair  Judgement:  Fair  Insight:  Fair  Psychomotor Activity:  Normal  Concentration:  Concentration: Fair  Recall:  of Knowledge:  Fair  Language:  Fair  Akathisia:  No  Handed:  Right  AIMS (if indicated):     Assets:  Desire for Improvement Housing Physical Health Social Support  ADL's:  Intact  Cognition:  WNL  Sleep:  Number of Hours: 7.75     Have you used any form of tobacco in the last 30 days? (Cigarettes, Smokeless Tobacco, Cigars, and/or Pipes): No  Has this patient used any form of tobacco in the last 30 days? (Cigarettes, Smokeless Tobacco, Cigars, and/or Pipes) Yes, Yes, A prescription for an FDA-approved tobacco cessation medication was offered at discharge and the patient refused  Blood Alcohol level:  Lab Results  Component Value Date   ETH <10 81/85/6314    Metabolic Disorder Labs:  No results found for: HGBA1C, MPG No results found for: PROLACTIN No results found for: CHOL, TRIG, HDL, CHOLHDL, VLDL, LDLCALC  See Psychiatric Specialty Exam and Suicide Risk Assessment completed by Attending Physician prior to discharge.  Discharge destination:  Home  Is patient on multiple antipsychotic therapies at discharge:  No   Has Patient had three or more failed trials of antipsychotic monotherapy by history:  No  Recommended Plan for Multiple Antipsychotic Therapies: NA  Discharge Instructions    Diet - low  sodium heart healthy   Complete by: As directed    Increase activity slowly   Complete by: As directed      Allergies as of 04/04/2019      Reactions   Sertraline Other (See Comments)   Felt on emotion   Pepto-bismol [bismuth Subsalicylate] Rash   Septra [sulfamethoxazole-trimethoprim] Rash      Medication List    STOP taking these medications   diphenhydrAMINE 25 MG tablet Commonly known as: BENADRYL     TAKE these medications     Indication  Adderall XR 20 MG 24 hr capsule Generic drug: amphetamine-dextroamphetamine Take 40 mg by mouth every morning.  Indication: Attention Deficit Hyperactivity Disorder   albuterol 108 (90 Base) MCG/ACT inhaler Commonly known as: VENTOLIN HFA Inhale 2 puffs into the lungs every 6 (six) hours as needed for wheezing or shortness of breath.  Indication: Asthma   cetirizine 10 MG tablet Commonly known as: ZYRTEC Take 10 mg by mouth daily.  Indication: Perennial Allergic Rhinitis   clonazePAM 1 MG tablet Commonly known as: KLONOPIN Take 1 mg by mouth 2 (two) times daily as needed for anxiety.  Indication: Feeling Anxious   escitalopram 5 MG tablet Commonly known as: LEXAPRO Take 1 tablet (5 mg total) by mouth daily. Start taking on: April 05, 2019  Indication: Posttraumatic Stress Disorder   fluticasone 50 MCG/ACT nasal spray Commonly known as: FLONASE Place 1 spray daily into both nostrils.  Indication: Signs and Symptoms of Nose Diseases   Norethin Ace-Eth Estrad-FE 1-20 MG-MCG(24) Caps Take 1 capsule by mouth daily.  Indication: Pain During Periods   traZODone 100 MG tablet Commonly known as: DESYREL Take 100 mg by mouth at bedtime.  Indication: Trouble Sleeping   valACYclovir 500 MG tablet Commonly known as: VALTREX Take 500 mg by mouth 2 (two) times daily as needed (flare up).  Indication: Herpes Simplex Infection      Follow-up Information    Group, [facility].   Specialty: [facility] information: [place] [address] [phone]           Follow-up recommendations:  Activity:  Activity as tolerated Diet:  Regular diet Other:  Follow-up in [place] continue current medicine for the time being  Comments: Prescription for the Lexapro was provided at discharge  Signed: Alethia Berthold, MD 04/04/2019, 11:37 AM

## 2019-04-11 ENCOUNTER — Ambulatory Visit (INDEPENDENT_AMBULATORY_CARE_PROVIDER_SITE_OTHER): Payer: BC Managed Care – PPO | Admitting: Addiction (Substance Use Disorder)

## 2019-04-11 ENCOUNTER — Other Ambulatory Visit: Payer: Self-pay

## 2019-04-11 ENCOUNTER — Encounter: Payer: Self-pay | Admitting: Addiction (Substance Use Disorder)

## 2019-04-11 DIAGNOSIS — F4325 Adjustment disorder with mixed disturbance of emotions and conduct: Secondary | ICD-10-CM | POA: Diagnosis not present

## 2019-04-11 DIAGNOSIS — F4310 Post-traumatic stress disorder, unspecified: Secondary | ICD-10-CM

## 2019-04-11 NOTE — Progress Notes (Signed)
[facility] Initial Adult Exam  Name: Monica [name] Date: 04/11/2019 MRN: [id number] DOB: Apr 18, 1970 PCP: Monica Bussing, MD  Time spent: 3:05-4:00 55 mins  Reason for Visit /Presenting Problem: Client came in having the worst week in a long time she reported. Client compared this: having her fiance walk out of their relationship after he said that she had been different. Client shared about her past having been abandoned by her own mother when she was 7 from her mom taking her life. Client talked about life has been most stressful the last few years because of her step-son's mom coming back into her step-sons' life after Monica [name] had raised Monica [name] (the son). Client with great regrets of not marrying her 'husband' and feeling lost without him. Client cannot understand why he would have left her at her lowest if he knew how she was feeling. Client questioning her marriage due to the break of her trust and her loss of ability to feel like she can go on without him. Therapist talked with client about her will to live and assessed for client's stability. Client denied SI/HI/AVH and reported no actual intention to die when she threatened to take her life the night her husband said he was leaving. Client learning more about codependency and desires to work on trust issues and growing her ability to see her own strength and ability to survive and eventually thrive. Client and therapist built rapport and client interested in coming weekly to therapy and wants to also find group therapy to talk through relationship issues and things related to her MH/depression. Client reported some of her MH plan to stay safe including starting zoloft for her depression and desire to stabilize.  Mental Status Exam:   Appearance:   Neat     Behavior:  Appropriate  Motor:  Normal  Speech/Language:   Clear and Coherent  Affect:  Appropriate, Congruent, Full Range and Tearful  Mood:  angry, anxious, depressed, labile  and sad  Thought process:  flight of ideas  Thought content:    Obsessions and Rumination  Sensory/Perceptual disturbances:    Flashback  Orientation:  x4  Attention:  Good  Concentration:  Good  Memory:  WNL  Fund of knowledge:   Good  Insight:    Fair  Judgment:   Good  Impulse Control:  Fair   Reported Symptoms:  Felt lost, tearful, depressed, anxious, feeling abandonment, insomnia.  Risk Assessment: Danger to Self:  Yes.  without intent/plan Self-injurious Behavior: No Danger to Others: No Duty to Warn:no Physical Aggression / Violence:No  Access to Firearms a concern: No  Gang Involvement:No  Patient / guardian was educated about steps to take if suicide or homicide risk level increases between visits: yes - clients brother came to stay with her since SI and husband left.  While future psychiatric events cannot be accurately predicted, the patient does not currently require acute inpatient psychiatric care and does not currently meet [facility] involuntary commitment criteria.  Substance Abuse History: Current substance abuse: No     Past Psychiatric History:   Previous psychological history is significant for anxiety and PTSD Outpatient Providers: Dr Monica [name] at [facility] [place]. History of Psych Hospitalization: Yes - 1 week ago for 2 days for SI. Psychological Testing: n/a   Abuse History: Victim of No., n/a   Report needed: No. Victim of Neglect:No. Perpetrator of n/a  Witness / Exposure to Domestic Violence: No   Protective Services Involvement: No  Witness to [employer] Violence:  No   Family History:  Family History  Problem Relation Age of Onset  . Bipolar disorder Mother   . Lung cancer Father   . Migraines Neg Hx     Living situation: the patient lives alone- was living with his fiance but brother temporarily staying with her.   Sexual Orientation:  Straight  Relationship Status: separated  Name of spouse / other: Monica [name] - fiance but now  separated             If a parent, number of children / ages: Monica [name] Client 50 year old daughter, Monica [name] 59 yo step son.  Support Systems; significant other friends parents  Surveyor, quantity Stress:  Yes   Income/Employment/Disability: Employment  Financial planner: No   Educational History: Education: Risk manager:   Catholic- raised. Believer & active with [facility].   Any cultural differences that may affect / interfere with treatment:  not applicable.  Recreation/Hobbies: organize, help others, puzzles, travel, being with family, reading.  Stressors:Marital or family conflict Traumatic event- mom took her life at her age of 49 and she found her..   Strengths:  Family, Friends, Journalist, newspaper and Able to Communicate Effectively  Barriers:  losing hope/ feeling lost.    Legal History: Pending legal issue / charges: The patient has no significant history of legal issues. History of legal issue / charges: n/a  Medical History/Surgical History:reviewed Past Medical History:  Diagnosis Date  . ADD (attention deficit disorder)   . Anxiety disorder   . Heart murmur   . Herpes simplex type 1 infection   . Insomnia    unspecified  . Migraines   . MRSA infection    left great toe  . PTSD (post-traumatic stress disorder)   . Seasonal allergies     Past Surgical History:  Procedure Laterality Date  . REDUCTION MAMMAPLASTY      Medications: Current Outpatient Medications  Medication Sig Dispense Refill  . albuterol (PROVENTIL HFA;VENTOLIN HFA) 108 (90 Base) MCG/ACT inhaler Inhale 2 puffs into the lungs every 6 (six) hours as needed for wheezing or shortness of breath.    . amphetamine-dextroamphetamine (ADDERALL XR) 20 MG 24 hr capsule Take 40 mg by mouth every morning.    . cetirizine (ZYRTEC) 10 MG tablet Take 10 mg by mouth daily.    . clonazePAM (KLONOPIN) 1 MG tablet Take 1 mg by mouth 2 (two) times daily as needed for anxiety.      Marland Kitchen escitalopram (LEXAPRO) 5 MG tablet Take 1 tablet (5 mg total) by mouth daily. 30 tablet 1  . fluticasone (FLONASE) 50 MCG/ACT nasal spray Place 1 spray daily into both nostrils.    . Norethin Ace-Eth Estrad-FE 1-20 MG-MCG(24) CAPS Take 1 capsule by mouth daily.     . traZODone (DESYREL) 100 MG tablet Take 100 mg by mouth at bedtime.    . valACYclovir (VALTREX) 500 MG tablet Take 500 mg by mouth 2 (two) times daily as needed (flare up).      No current facility-administered medications for this visit.    Allergies  Allergen Reactions  . Sertraline Other (See Comments)    Felt on emotion  . Pepto-Bismol [Bismuth Subsalicylate] Rash  . Septra [Sulfamethoxazole-Trimethoprim] Rash    Diagnoses:    ICD-10-CM   1. Adjustment disorder with mixed disturbance of emotions and conduct  F43.25   2. PTSD (post-traumatic stress disorder)  F43.10     Plan of Care: Client to return for weekly therapy with [facility]  Donzetta Matters, therapist, to review again in 6 months.  Client is to being seeing medication provider for support of mood management for depression, passive SI, and insomnia.    Client to engage in CBT: challenging negative internal ruminations and self-talk AEB journaling daily or expressing thoughts to support persons in their life and then challenging it with truth.  Client to engage in tapping to tap in healthy cognition that challenges negative rumination or deep negative core belief.  Client to practice DBT distress tolerance skills to decrease crying spells and thoughts of not being able to endure their suffering AEB using mindfulness, deep breathing, and TIP to increase tolerance for discomfort, discharge emotional distress, and increase their understanding that they can do hard things.  Client to utilize BSP (brainspotting) with therapist to help client identify and process triggers for her her depression, anxiety, and abandonment with goal of reducing said SUDs caused by depression/anxiety by  33% in the next 6 months.  Client to prioritize sleep 8+ hours each week night AEB going to bed by 10pm each night.  Client participated in the treatment planning of their therapy. Client agreed with the plan and understands what to do if there is a crisis: call 9-1-1 and/or crisis line given by therapist.   Barnie Del, LCSW, LCAS, CCTP, CCS-I, BSP

## 2019-04-14 ENCOUNTER — Other Ambulatory Visit: Payer: Self-pay

## 2019-04-14 ENCOUNTER — Encounter: Payer: Self-pay | Admitting: Adult Health

## 2019-04-14 ENCOUNTER — Ambulatory Visit (INDEPENDENT_AMBULATORY_CARE_PROVIDER_SITE_OTHER): Payer: BC Managed Care – PPO | Admitting: Adult Health

## 2019-04-14 VITALS — BP 137/99 | HR 77 | Ht 66.0 in | Wt 197.0 lb

## 2019-04-14 DIAGNOSIS — F4310 Post-traumatic stress disorder, unspecified: Secondary | ICD-10-CM | POA: Diagnosis not present

## 2019-04-14 DIAGNOSIS — G4700 Insomnia, unspecified: Secondary | ICD-10-CM

## 2019-04-14 DIAGNOSIS — F331 Major depressive disorder, recurrent, moderate: Secondary | ICD-10-CM | POA: Diagnosis not present

## 2019-04-14 DIAGNOSIS — F909 Attention-deficit hyperactivity disorder, unspecified type: Secondary | ICD-10-CM

## 2019-04-14 DIAGNOSIS — F411 Generalized anxiety disorder: Secondary | ICD-10-CM | POA: Diagnosis not present

## 2019-04-14 MED ORDER — ESCITALOPRAM OXALATE 5 MG PO TABS: 5.0000 mg | ORAL_TABLET | Freq: Every day | ORAL | 2 refills | Status: DC

## 2019-04-14 NOTE — Progress Notes (Signed)
[facility] MD/PA/NP Initial Note  04/14/2019 4:34 PM Monica [name]  MRN:  [id number]  Chief Complaint:  Chief Complaint    Anxiety; Depression; ADHD; Insomnia; Other      HPI:  Describes mood today as "ok". Pleasant. Flat. Smiling at times. Mood symptoms - denies depression, anxiety, and irritability. Stating "I'm doing much better". Recently hospitalized for 48 hours after having impulsive suicidal ideation after a recent break-up with partner of 11 years. Feels like she is in a "much better" place now. Taking medications as advised at discharge. Feels like the Lexapro has "leveled" things out. Not nearly as anxious. Having difficulties sleeping since leaving off the Trazadone. Had been taking both Clonazepam and Trazadone for several months prior to hospitalization and slept well. Requesting to restart this regimen. Returned to work and is "doing good with that". Has started seeing a therapist. Talking to daughter. Improved interest and motivation. Taking medications as prescribed.  Energy levels lower without sleep. Active, does not have a regular exercise routine. 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  Enjoys some usual interests and activities. Single. Lives alone. Has a 50 year old at [facility]. Brother lives in [place] and sister in [place]. Has supportive friends.  Appetite adequate. Weight stable. Sleeps well most nights. Averages 5 hours of broken sleep. Focus and concentration difficulties - "not the best and not the worst". Completing tasks. Managing aspects of household. Went back to work on Tuesday this week. Denies SI or HI. Denies AH or VH.  Previous medication trials: Adderall, Trazadone, Clonazepam  Visit Diagnosis:    ICD-10-CM   1. PTSD (post-traumatic stress disorder)  F43.10   2. Attention deficit hyperactivity disorder (ADHD), unspecified ADHD type  F90.9   3. Major depressive disorder, recurrent episode, moderate (HCC)  F33.1   4. Generalized  anxiety disorder  F41.1   5. Insomnia, unspecified type  G47.00     Past Psychiatric History:  Recent admission to [facility] health for 48 hours.   Past Medical History:  Past Medical History:  Diagnosis Date  . ADD (attention deficit disorder)   . Anxiety disorder   . Heart murmur   . Herpes simplex type 1 infection   . Insomnia    unspecified  . Migraines   . MRSA infection    left great toe  . PTSD (post-traumatic stress disorder)   . Seasonal allergies     Past Surgical History:  Procedure Laterality Date  . REDUCTION MAMMAPLASTY      Family Psychiatric History: Mother committed suicide when she was 7.   Family History:  Family History  Problem Relation Age of Onset  . Bipolar disorder Mother   . Lung cancer Father   . Migraines Neg Hx     Social History:  Social History   Socioeconomic History  . Marital status: Divorced    Spouse name: Not on file  . Number of children: 1  . Years of education: Not on file  . Highest education level: Bachelor's degree (e.g., BA, AB, BS)  Occupational History  . Not on file  Tobacco Use  . Smoking status: Former Smoker    Types: Cigarettes    Quit date: 07/30/2011    Years since quitting: 7.7  . Smokeless tobacco: Never Used  Substance and Sexual Activity  . Alcohol use: Not Currently    Comment: occasional  . Drug use: No  . Sexual activity: Not on file  Other Topics Concern  . Not  on file  Social History Narrative   Lives at home with fiance   Right handed   2 cups of caffeine daily   Social Determinants of Health   Financial Resource Strain:   . Difficulty of Paying Living Expenses: Not on file  Food Insecurity:   . Worried About Charity fundraiser in the Last Year: Not on file  . Ran Out of Food in the Last Year: Not on file  Transportation Needs:   . Lack of Transportation (Medical): Not on file  . Lack of Transportation (Non-Medical): Not on file  Physical Activity:   . Days of Exercise  per Week: Not on file  . Minutes of Exercise per Session: Not on file  Stress:   . Feeling of Stress : Not on file  Social Connections:   . Frequency of Communication with Friends and Family: Not on file  . Frequency of Social Gatherings with Friends and Family: Not on file  . Attends Religious Services: Not on file  . Active Member of Clubs or Organizations: Not on file  . Attends Archivist Meetings: Not on file  . Marital Status: Not on file    Allergies:  Allergies  Allergen Reactions  . Sertraline Other (See Comments)    Felt on emotion  . Pepto-Bismol [Bismuth Subsalicylate] Rash  . Septra [Sulfamethoxazole-Trimethoprim] Rash    Metabolic Disorder Labs: No results found for: HGBA1C, MPG No results found for: PROLACTIN No results found for: CHOL, TRIG, HDL, CHOLHDL, VLDL, LDLCALC Lab Results  Component Value Date   TSH 1.131 04/03/2019    Therapeutic Level Labs: No results found for: LITHIUM No results found for: VALPROATE No components found for:  CBMZ  Current Medications: Current Outpatient Medications  Medication Sig Dispense Refill  . albuterol (PROVENTIL HFA;VENTOLIN HFA) 108 (90 Base) MCG/ACT inhaler Inhale 2 puffs into the lungs every 6 (six) hours as needed for wheezing or shortness of breath.    . cetirizine (ZYRTEC) 10 MG tablet Take 10 mg by mouth daily.    . clonazePAM (KLONOPIN) 1 MG tablet Take 1 mg by mouth 2 (two) times daily as needed for anxiety.     Marland Kitchen escitalopram (LEXAPRO) 5 MG tablet Take 1 tablet (5 mg total) by mouth daily. 30 tablet 1  . fluticasone (FLONASE) 50 MCG/ACT nasal spray Place 1 spray daily into both nostrils.    . Norethin Ace-Eth Estrad-FE 1-20 MG-MCG(24) CAPS Take 1 capsule by mouth daily.     . valACYclovir (VALTREX) 500 MG tablet Take 500 mg by mouth 2 (two) times daily as needed (flare up).      No current facility-administered medications for this visit.    Medication Side Effects: none  Orders placed this  visit:  No orders of the defined types were placed in this encounter.   Psychiatric Specialty Exam:  Review of Systems  Musculoskeletal: Negative for gait problem.  Neurological: Negative for tremors.  Psychiatric/Behavioral:       Please refer to HPI    Blood pressure (!) 137/99, pulse 77, height 5\' 6"  (1.676 m), weight 197 lb (89.4 kg).Body mass index is 31.8 kg/m.  General Appearance: Neat and Well Groomed  Eye Contact:  Good  Speech:  Clear and Coherent and Normal Rate  Volume:  Normal  Mood:  Euthymic  Affect:  Appropriate  Thought Process:  Coherent and Descriptions of Associations: Intact  Orientation:  Full (Time, Place, and Person)  Thought Content: Logical   Suicidal Thoughts:  No  Homicidal Thoughts:  No  Memory:  WNL  Judgement:  Good  Insight:  Good  Psychomotor Activity:  Normal  Concentration:  Concentration: Good  Recall:  Good  Fund of Knowledge: Good  Language: Good  Assets:  Communication Skills Desire for Improvement Financial Resources/Insurance Housing Intimacy Leisure Time Physical Health Resilience Social Support Talents/Skills Transportation Vocational/Educational  ADL's:  Intact  Cognition: WNL  Prognosis:  Good   Screenings:  AIMS     Admission (Discharged) from 04/02/2019 in [facility] INPATIENT BEHAVIORAL MEDICINE  AIMS Total Score  0    AUDIT     Admission (Discharged) from 04/02/2019 in [facility]PAT[facility] BEHAV[facility] MEDICINE  Alcohol Use Disorder Identification Test Final Score (AUDIT)  0      Receiving Psychotherapy: Yes - Zoila Shutter  Treatment Plan/Recommendations:   Plan:  PDMP reviewed  1. Lexapro 5mg  daily  2. Clonazepam 1mg  at hs 3. Trazdone 100mg    RTC 4 weeks  Patient advised to contact office with any questions, adverse effects, or acute worsening in signs and symptoms.  Discussed potential benefits, risk, and side effects of benzodiazepines to include potential risk of tolerance and dependence, as well as possible  drowsiness.  Advised patient not to drive if experiencing drowsiness and to take lowest possible effective dose to minimize risk of dependence and tolerance.   , NP

## 2019-04-20 ENCOUNTER — Other Ambulatory Visit: Payer: Self-pay

## 2019-04-20 ENCOUNTER — Encounter: Payer: Self-pay | Admitting: Addiction (Substance Use Disorder)

## 2019-04-20 ENCOUNTER — Ambulatory Visit (INDEPENDENT_AMBULATORY_CARE_PROVIDER_SITE_OTHER): Payer: BC Managed Care – PPO | Admitting: Addiction (Substance Use Disorder)

## 2019-04-20 DIAGNOSIS — F4310 Post-traumatic stress disorder, unspecified: Secondary | ICD-10-CM | POA: Diagnosis not present

## 2019-04-20 DIAGNOSIS — F4325 Adjustment disorder with mixed disturbance of emotions and conduct: Secondary | ICD-10-CM

## 2019-04-20 NOTE — Progress Notes (Signed)
[facility] Counselor/Therapist Progress Note  Patient ID: Monica [name], MRN: [id number],    Date: 04/20/2019  Time Spent: 1:10-2:05 55 mins  Treatment Type: Individual Therapy  Reported Symptoms: feeling more stable MH, still so confused, less crying spells. Stable.  Mental Status Exam:  Appearance:   Well Groomed     Behavior:  Sharing  Motor:  Normal  Speech/Language:   Clear and Coherent  Affect:  Appropriate, Congruent and Full Range  Mood:  euthymic and calm  Thought process:  normal  Thought content:    Rumination  Sensory/Perceptual disturbances:    WNL  Orientation:  x4  Attention:  Good  Concentration:  Fair  Memory:  WNL  Fund of knowledge:   Good  Insight:    Good  Judgment:   Good  Impulse Control:  Good   Risk Assessment: Danger to Self:  No- No SI/ no despairing anymore/ feeling more even-keeled and stable.  Self-injurious Behavior: No Danger to Others: No Duty to Warn:no Physical Aggression / Violence:No  Access to Firearms a concern: No  Gang Involvement:No   Subjective: Client came in reporting feeling much more stable and in control of her emotions/triggers. Client reported that the Lexapro lifted her depression and negative self-thoughts. Client reported having less crying spells and being less confused and unable to cope with life on lifes terms. Client processed more of the triggers that came up in her marriage such as not feeling protected and it triggering old traumas such as losing her mom, one of her biggest protectors, when she was 7. Therapist used MI and BSP to help assist client in processing more of the connection she has to needing a protector. Client recognized a pattern of blaming herself and trying to protect everyone else/ be a buffer and identified the sensations of pressure and weight when having to think of 'doing it all for everyone'. Client reported vivid memory of sitting in a closet trying to keep her siblings away from her  mom who she just found who had hung herself. Client reported SUDs of 8/10 and therapist used MI and BSP to validate clients feelings and help her process these SUDs and core beliefs. Client made progress sharing her core belief that: she wasn't enough and she had to be someone she wasn't. She had given everything and got nothing in return.  Client reports self-sabotage and even sacrificing a relationship with her child to please her husband who wanted to please his ex wife and worked to grieve that. Client's SUDs reduced to 1/10 as she worked from a resource BSP model (resting in her being protected by her dad) to process the need for her to be protected and not have to take care of everyone else.   Interventions: Cognitive Behavioral Therapy, Motivational Interviewing, Grief Therapy and Brainspotting  Diagnosis:   ICD-10-CM   1. Adjustment disorder with mixed disturbance of emotions and conduct  F43.25   2. PTSD (post-traumatic stress disorder)  F43.10     Plan of Care: Client to return for weekly therapy with Sammuel Cooper, therapist, to review again in 6 months. Client is to being seeing medication provider for support of mood management for depression, passive SI, and insomnia.    Client to engage in CBT: challenging negative internal ruminationsand self-talk AEB journaling daily or expressing thoughts to support persons in their life and then challenging it with truth.  Client to engage in tapping to tap in healthy cognition that challenges negative  rumination or deep negative core belief.  Client to practice DBT distress tolerance skills to decrease crying spells and thoughts of not being able to endure their suffering AEB using mindfulness, deep breathing, and TIP to increase tolerance for discomfort, discharge emotional distress, and increase their understanding that they can do hard things.  Client to utilize BSP (brainspotting) with therapist to help client identify and process triggers for  her her depression, anxiety, and abandonment with goal of reducing said SUDs caused by depression/anxiety by 33% in the next 6 months. Client to prioritize sleep 8+ hours each week night AEB going to bed by 10pm each night. Client participated in the treatment planning of their therapy. Client agreed with the plan and understands what to do if there is a crisis: call 9-1-1 and/or crisis line given by therapist.   Pauline Good, LCSW, LCAS, CCTP, CCS-I, BSP

## 2019-05-04 ENCOUNTER — Ambulatory Visit (INDEPENDENT_AMBULATORY_CARE_PROVIDER_SITE_OTHER): Payer: BC Managed Care – PPO | Admitting: Addiction (Substance Use Disorder)

## 2019-05-04 ENCOUNTER — Other Ambulatory Visit: Payer: Self-pay

## 2019-05-04 DIAGNOSIS — F4310 Post-traumatic stress disorder, unspecified: Secondary | ICD-10-CM | POA: Diagnosis not present

## 2019-05-04 DIAGNOSIS — F4325 Adjustment disorder with mixed disturbance of emotions and conduct: Secondary | ICD-10-CM | POA: Diagnosis not present

## 2019-05-04 NOTE — Progress Notes (Signed)
[facility] Counselor/Therapist Progress Note  Patient ID: Monica Monica [name], MRN: [id number],    Date: 05/04/2019  Time Spent: 8:06-8:55 49 mins  Treatment Type: Individual Therapy  Reported Symptoms: feeling abandoned again since her bday yesterday, uncontrollable crying.   Mental Status Exam:  Appearance:   Well Groomed     Behavior:  Sharing  Motor:  Normal  Speech/Language:   Garbled  Affect:  Appropriate, Congruent and Full Range  Mood:  euthymic and calm  Thought process:  normal  Thought content:    Rumination  Sensory/Perceptual disturbances:    WNL  Orientation:  x4  Attention:  Monica [name]  Concentration:  Fair  Memory:  WNL  Fund of knowledge:   Monica [name]  Insight:    Monica [name]  Judgment:   Monica [name]  Impulse Control:  Monica [name]   Risk Assessment: Danger to Self:  No- No SI. No plan no means no intent.  Self-injurious Behavior: No Danger to Others: No Duty to Warn:no Physical Aggression / Violence:No  Access to Firearms a concern: No  Gang Involvement:No   Subjective: Client reported feeling abandoned again since her bday was yesterday and her ex didn't contact her. She reported having a Monica [name] day and trying to emotionally regulate but having uncontrollable crying last night and this morning again. Client shared how she felt the loss of losing her relationship with her ex all over again and being flooded by feelings of overwhelm and began to sob. Therapist used mindfulness to help teach the client how to notice herself in this present moment even as she processes past pain. Client remembered this feeling of loss/ abandonment being the same as the one she felt when her mom took her life: she reported having a pounding headache with an ear-splitting scream that followed (SUDs level 9/10). Therapist used MI & BSP with client: one eyed-resource brainspotting to help client emotionally ground and process her loss/abandonment and pounding headache and desire to scream. Client made progress in  session processing her ability to be okay without her ex and be safe alone. Client remembered her crutch to have to take care of everyone else and set intentions for her ability to take care of herself.   Interventions: Cognitive Behavioral Therapy, Motivational Interviewing, Grief Therapy and Brainspotting  Diagnosis:   ICD-10-CM   1. Adjustment disorder with mixed disturbance of emotions and conduct  F43.25   2. PTSD (post-traumatic stress disorder)  F43.10     Plan of Care: Client to return for weekly therapy with Monica Monica [name], therapist, to review again in 6 months. Client is to being seeing medication provider for support of mood management for depression, passive SI, and insomnia.    Client to engage in CBT: challenging negative internal ruminationsand self-talk AEB journaling daily or expressing thoughts to support persons in their life and then challenging it with truth.  Client to engage in tapping to tap in healthy cognition that challenges negative rumination or deep negative core belief.  Client to practice DBT distress tolerance skills to decrease crying spells and thoughts of not being able to endure their suffering AEB using mindfulness, deep breathing, and TIP to increase tolerance for discomfort, discharge emotional distress, and increase their understanding that they can do hard things.  Client to utilize BSP (brainspotting) with therapist to help client identify and process triggers for her her depression, anxiety, and abandonment with goal of reducing said SUDs caused by depression/anxiety by 33% in the next 6 months. Client to prioritize sleep  8+ hours each week night AEB going to bed by 10pm each night. Client participated in the treatment planning of their therapy. Client agreed with the plan and understands what to do if there is a crisis: call 9-1-1 and/or crisis line given by therapist.   Monica Good, LCSW, LCAS, CCTP, CCS-I, BSP

## 2019-05-05 ENCOUNTER — Other Ambulatory Visit: Payer: Self-pay | Admitting: Obstetrics & Gynecology

## 2019-05-05 DIAGNOSIS — Z1231 Encounter for screening mammogram for malignant neoplasm of breast: Secondary | ICD-10-CM

## 2019-05-11 ENCOUNTER — Encounter: Payer: Self-pay | Admitting: Adult Health

## 2019-05-11 ENCOUNTER — Other Ambulatory Visit: Payer: Self-pay

## 2019-05-11 ENCOUNTER — Encounter: Payer: Self-pay | Admitting: Addiction (Substance Use Disorder)

## 2019-05-11 ENCOUNTER — Ambulatory Visit: Payer: BC Managed Care – PPO | Admitting: Addiction (Substance Use Disorder)

## 2019-05-11 ENCOUNTER — Ambulatory Visit (INDEPENDENT_AMBULATORY_CARE_PROVIDER_SITE_OTHER): Payer: BC Managed Care – PPO | Admitting: Adult Health

## 2019-05-11 DIAGNOSIS — G4700 Insomnia, unspecified: Secondary | ICD-10-CM | POA: Diagnosis not present

## 2019-05-11 DIAGNOSIS — F331 Major depressive disorder, recurrent, moderate: Secondary | ICD-10-CM | POA: Diagnosis not present

## 2019-05-11 DIAGNOSIS — F411 Generalized anxiety disorder: Secondary | ICD-10-CM

## 2019-05-11 DIAGNOSIS — F4310 Post-traumatic stress disorder, unspecified: Secondary | ICD-10-CM

## 2019-05-11 DIAGNOSIS — F909 Attention-deficit hyperactivity disorder, unspecified type: Secondary | ICD-10-CM | POA: Diagnosis not present

## 2019-05-11 DIAGNOSIS — F4325 Adjustment disorder with mixed disturbance of emotions and conduct: Secondary | ICD-10-CM

## 2019-05-11 MED ORDER — TRAZODONE HCL 100 MG PO TABS: ORAL_TABLET | ORAL | 5 refills | Status: DC

## 2019-05-11 MED ORDER — ESCITALOPRAM OXALATE 5 MG PO TABS: 5.0000 mg | ORAL_TABLET | Freq: Every day | ORAL | 2 refills | Status: DC

## 2019-05-11 MED ORDER — CLONAZEPAM 1 MG PO TABS: 1.0000 mg | ORAL_TABLET | Freq: Every day | ORAL | 2 refills | Status: DC

## 2019-05-11 NOTE — Progress Notes (Signed)
Monica [name] [id number] June 14, 1970 49 y.o.  Subjective:   Patient ID:  Monica [name] is a 49 y.o. (DOB 05/22/1970) female.  Chief Complaint: No chief complaint on file.   HPI   CADANCE RAUS presents to the office today for follow-up of MDD, GAD, PTSD, Insomnia, panic attacks, and ADHD.  Describes mood today as "ok". Pleasant. Smiling at times. Mood symptoms - denies depression, anxiety, and irritability. Stating "I'm doing pretty good". Feels like the Lexapro has been "helpful". Seeing therapist and using coping strategies.  Feels like she has made progress over the past few weeks. Has limited communication with "ex". Stating "I can't change it, but I can accept it". Seeing therapist. Stable interest and motivation. Taking medications as prescribed.  Energy levels improved. Active, does not have a regular exercise routine. 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  Enjoys some usual interests and activities. Single. Lives alone. Has a 52 year old daughter at [facility] (doing well). Brother lives in [place] and sister in [place]. Has supportive friends.  Appetite adequate. Weight stable. Sleeps well most nights. Averages 7 to 8 hours. Focus and concentration "getting better". Still putting things off. Completing tasks. Managing aspects of household. Work going well.  Denies SI or HI. Denies AH or VH.  Previous medication trials: Adderall, Trazadone, Clonazepam  Review of Systems:  Review of Systems  Musculoskeletal: Negative for gait problem.  Neurological: Negative for tremors.  Psychiatric/Behavioral:       Please refer to HPI    Medications: I have reviewed the patient's current medications.  Current Outpatient Medications  Medication Sig Dispense Refill  . albuterol (PROVENTIL HFA;VENTOLIN HFA) 108 (90 Base) MCG/ACT inhaler Inhale 2 puffs into the lungs every 6 (six) hours as needed for wheezing or shortness of breath.    . cetirizine (ZYRTEC) 10 MG  tablet Take 10 mg by mouth daily.    . clonazePAM (KLONOPIN) 1 MG tablet Take 1 mg by mouth 2 (two) times daily as needed for anxiety.     Marland Kitchen escitalopram (LEXAPRO) 5 MG tablet Take 1 tablet (5 mg total) by mouth daily. 30 tablet 2  . fluticasone (FLONASE) 50 MCG/ACT nasal spray Place 1 spray daily into both nostrils.    . Norethin Ace-Eth Estrad-FE 1-20 MG-MCG(24) CAPS Take 1 capsule by mouth daily.     . valACYclovir (VALTREX) 500 MG tablet Take 500 mg by mouth 2 (two) times daily as needed (flare up).      No current facility-administered medications for this visit.    Medication Side Effects: None  Allergies:  Allergies  Allergen Reactions  . Sertraline Other (See Comments)    Felt on emotion  . Pepto-Bismol [Bismuth Subsalicylate] Rash  . Septra [Sulfamethoxazole-Trimethoprim] Rash    Past Medical History:  Diagnosis Date  . ADD (attention deficit disorder)   . Anxiety disorder   . Heart murmur   . Herpes simplex type 1 infection   . Insomnia    unspecified  . Migraines   . MRSA infection    left great toe  . PTSD (post-traumatic stress disorder)   . Seasonal allergies     Family History  Problem Relation Age of Onset  . Bipolar disorder Mother   . Lung cancer Father   . Migraines Neg Hx     Social History   Socioeconomic History  . Marital status: Divorced    Spouse name: Not on file  . Number of children: 1  .  Years of education: Not on file  . Highest education level: Bachelor's degree (e.g., BA, AB, BS)  Occupational History  . Not on file  Tobacco Use  . Smoking status: Former Smoker    Types: Cigarettes    Quit date: 07/30/2011    Years since quitting: 7.7  . Smokeless tobacco: Never Used  Substance and Sexual Activity  . Alcohol use: Not Currently    Comment: occasional  . Drug use: No  . Sexual activity: Not on file  Other Topics Concern  . Not on file  Social History Narrative   Lives at home with fiance   Right handed   2 cups of  caffeine daily   Social Determinants of Health   Financial Resource Strain:   . Difficulty of Paying Living Expenses: Not on file  Food Insecurity:   . Worried About Programme researcher, broadcasting/film/video in the Last Year: Not on file  . Ran Out of Food in the Last Year: Not on file  Transportation Needs:   . Lack of Transportation (Medical): Not on file  . Lack of Transportation (Non-Medical): Not on file  Physical Activity:   . Days of Exercise per Week: Not on file  . Minutes of Exercise per Session: Not on file  Stress:   . Feeling of Stress : Not on file  Social Connections:   . Frequency of Communication with Friends and Family: Not on file  . Frequency of Social Gatherings with Friends and Family: Not on file  . Attends Religious Services: Not on file  . Active Member of Clubs or Organizations: Not on file  . Attends Banker Meetings: Not on file  . Marital Status: Not on file  Intimate Partner Violence:   . Fear of Current or Ex-Partner: Not on file  . Emotionally Abused: Not on file  . Physically Abused: Not on file  . Sexually Abused: Not on file    Past Medical History, Surgical history, Social history, and Family history were reviewed and updated as appropriate.   Please see review of systems for further details on the patient's review from today.   Objective:   Physical Exam:  There were no vitals taken for this visit.  Physical Exam Constitutional:      General: She is not in acute distress. Musculoskeletal:        General: No deformity.  Neurological:     Mental Status: She is alert and oriented to person, place, and time.     Coordination: Coordination normal.  Psychiatric:        Attention and Perception: Attention and perception normal. She does not perceive auditory or visual hallucinations.        Mood and Affect: Mood normal. Mood is not anxious or depressed. Affect is not labile, blunt, angry or inappropriate.        Speech: Speech normal.         Behavior: Behavior normal.        Thought Content: Thought content normal. Thought content is not paranoid or delusional. Thought content does not include homicidal or suicidal ideation. Thought content does not include homicidal or suicidal plan.        Cognition and Memory: Cognition and memory normal.        Judgment: Judgment normal.     Comments: Insight intact     Lab Review:     Component Value Date/Time   NA 137 04/01/2019 2236   K 3.3 (L) 04/01/2019 2236  CL 101 04/01/2019 2236   CO2 26 04/01/2019 2236   GLUCOSE 108 (H) 04/01/2019 2236   BUN 7 04/01/2019 2236   CREATININE 0.84 04/01/2019 2236   CALCIUM 8.8 (L) 04/01/2019 2236   PROT 7.5 04/01/2019 2236   ALBUMIN 3.9 04/01/2019 2236   AST 20 04/01/2019 2236   ALT 14 04/01/2019 2236   ALKPHOS 36 (L) 04/01/2019 2236   BILITOT 0.4 04/01/2019 2236   GFRNONAA >60 04/01/2019 2236   GFRAA >60 04/01/2019 2236       Component Value Date/Time   WBC 8.5 04/01/2019 2236   RBC 4.21 04/01/2019 2236   HGB 12.4 04/01/2019 2236   HCT 37.9 04/01/2019 2236   PLT 340 04/01/2019 2236   MCV 90.0 04/01/2019 2236   MCH 29.5 04/01/2019 2236   MCHC 32.7 04/01/2019 2236   RDW 13.6 04/01/2019 2236   LYMPHSABS 1.4 07/23/2015 1038   MONOABS 0.4 07/23/2015 1038   EOSABS 0.0 07/23/2015 1038   BASOSABS 0.0 07/23/2015 1038    No results found for: POCLITH, LITHIUM   No results found for: PHENYTOIN, PHENOBARB, VALPROATE, CBMZ   .res Assessment: Plan:    Plan:  PDMP reviewed  1. Lexapro 5mg  daily  2. Clonazepam 1mg  at hs 3. Trazdone 100mg    RTC 4 weeks  Patient advised to contact office with any questions, adverse effects, or acute worsening in signs and symptoms.  Discussed potential benefits, risk, and side effects of benzodiazepines to include potential risk of tolerance and dependence, as well as possible drowsiness.  Advised patient not to drive if experiencing drowsiness and to take lowest possible effective dose to  minimize risk of dependence and tolerance.  There are no diagnoses linked to this encounter.   Please see After Visit Summary for patient specific instructions.  Future Appointments  Date Time Provider [place]  05/11/2019  3:00 PM Barnie Del, LCSW [facility] None  05/18/2019  1:00 PM Barnie Del, LCSW [facility] None  05/25/2019  8:00 AM Barnie Del, LCSW [facility] None  06/01/2019  8:00 AM Barnie Del, LCSW [facility] None  07/13/2019  1:50 PM [facility] MM 2 [facility]    No orders of the defined types were placed in this encounter.   -------------------------------

## 2019-05-11 NOTE — Progress Notes (Signed)
[facility] Counselor/Therapist Progress Note  Patient ID: Monica [name], MRN: [id number],    Date: 05/11/2019  Time Spent: 3:03-4:05 62 mins  Treatment Type: Individual Therapy  Reported Symptoms: feeling great, stabilized, lighter, etc.   Mental Status Exam:  Appearance:   Well Groomed     Behavior:  Sharing  Motor:  Normal  Speech/Language:   Clear and Coherent  Affect:  Appropriate, Congruent and Full Range  Mood:  euthymic and calm  Thought process:  normal  Thought content:    Rumination  Sensory/Perceptual disturbances:    WNL  Orientation:  x4  Attention:  Good  Concentration:  Fair  Memory:  WNL  Fund of knowledge:   Good  Insight:    Good  Judgment:   Good  Impulse Control:  Good   Risk Assessment: Danger to Self:  No- No SI. No plan no means no intent.  Self-injurious Behavior: No Danger to Others: No Duty to Warn:no Physical Aggression / Violence:No  Access to Firearms a concern: No  Gang Involvement:No   Subjective: Client reported feeling completely different than she did last week. Client processed how the brainspotting session made her so tired after session and she was able to feel her feelings and take care of herself. Therapist used SFT and MI to validate clients improvements and to help client continue to work on noticing her body/ how she feels and being compassionate towards it using mindfulness. Client brought a letter that she wrote to herself 3 years ago about how her ex said he didn't love her but how she was trying to make it work. Client identified other areas of attachment issues and shared how hard it was because she and her ex never really had a sense of security together. Therapist used grief therapy to help client continue to process this. Client made a goal to not let these mistakes be repeated in her future relationships: esp holding on to resentment/jealousy or feeling like shes crazy.  Interventions: Cognitive Behavioral Therapy,  Mindfulness Meditation, Motivational Interviewing, Solution-Oriented/Positive Psychology, Grief Therapy and Brainspotting  Diagnosis:   ICD-10-CM   1. Adjustment disorder with mixed disturbance of emotions and conduct  F43.25   2. PTSD (post-traumatic stress disorder)  F43.10     Plan of Care: Client to return for weekly therapy with Zoila Shutter, therapist, to review again in 6 months. Client is to being seeing medication provider for support of mood management for depression, passive SI, and insomnia.    Client to engage in CBT: challenging negative internal ruminationsand self-talk AEB journaling daily or expressing thoughts to support persons in their life and then challenging it with truth.  Client to engage in tapping to tap in healthy cognition that challenges negative rumination or deep negative core belief.  Client to practice DBT distress tolerance skills to decrease crying spells and thoughts of not being able to endure their suffering AEB using mindfulness, deep breathing, and TIP to increase tolerance for discomfort, discharge emotional distress, and increase their understanding that they can do hard things.  Client to utilize BSP (brainspotting) with therapist to help client identify and process triggers for her her depression, anxiety, and abandonment with goal of reducing said SUDs caused by depression/anxiety by 33% in the next 6 months. Client to prioritize sleep 8+ hours each week night AEB going to bed by 10pm each night. Client participated in the treatment planning of their therapy. Client agreed with the plan and understands what to do  if there is a crisis: call 9-1-1 and/or crisis line given by therapist.   Barnie Del, LCSW, LCAS, CCTP, CCS-I, BSP

## 2019-05-18 ENCOUNTER — Encounter: Payer: Self-pay | Admitting: Addiction (Substance Use Disorder)

## 2019-05-18 ENCOUNTER — Ambulatory Visit (INDEPENDENT_AMBULATORY_CARE_PROVIDER_SITE_OTHER): Payer: BC Managed Care – PPO | Admitting: Addiction (Substance Use Disorder)

## 2019-05-18 ENCOUNTER — Other Ambulatory Visit: Payer: Self-pay

## 2019-05-18 DIAGNOSIS — F411 Generalized anxiety disorder: Secondary | ICD-10-CM

## 2019-05-18 NOTE — Progress Notes (Signed)
      [facility] Counselor/Therapist Progress Note  Patient ID: Monica [name], MRN: [id number],    Date: 05/18/2019  Time Spent: 1:05-2:00 55 mins  Treatment Type: Individual Therapy  Reported Symptoms: tired- disturbed sleep, less stressed.   Mental Status Exam:  Appearance:   Casual     Behavior:  Sharing  Motor:  Normal  Speech/Language:   Clear and Coherent  Affect:  Appropriate  Mood:  euthymic and calm  Thought process:  normal  Thought content:    Rumination  Sensory/Perceptual disturbances:    WNL  Orientation:  x4  Attention:  Good  Concentration:  Fair  Memory:  WNL  Fund of knowledge:   Good  Insight:    Good  Judgment:   Good  Impulse Control:  Good   Risk Assessment: Danger to Self:  No- No SI. No plan, no means, no intent.  Self-injurious Behavior: No Danger to Others: No Duty to Warn:no Physical Aggression / Violence:No  Access to Firearms a concern: No  Gang Involvement:No   Subjective: Client came in reporting feeling sad and tired. Client processed her disturbed sleep and the vulnerability and loneliness shes feeling, esp at night. Therapist used MI and Grief therapy to process her pain from the nostalgia of her broken relationship. Client shared memories of how the relationship began to spiral and discussed her thoughts/feelings. Therapist used CBT to investigate her beliefs and possible distortions continuing to grieve him. Client made progress identifying the difference for her feeling sad and depressed. Client shared her feelings now were grief not depression; she stated her depression medications have helped greatly.  Interventions: Cognitive Behavioral Therapy, Mindfulness Meditation, Motivational Interviewing and Grief Therapy  Diagnosis:   ICD-10-CM   1. Generalized anxiety disorder  F41.1     Plan of Care: Client to return for weekly therapy with Zoila Shutter, therapist, to review again in 6 months. Client is to being seeing medication  provider for support of mood management for depression, passive SI, and insomnia.    Client to engage in CBT: challenging negative internal ruminationsand self-talk AEB journaling daily or expressing thoughts to support persons in their life and then challenging it with truth.  Client to engage in tapping to tap in healthy cognition that challenges negative rumination or deep negative core belief.  Client to practice DBT distress tolerance skills to decrease crying spells and thoughts of not being able to endure their suffering AEB using mindfulness, deep breathing, and TIP to increase tolerance for discomfort, discharge emotional distress, and increase their understanding that they can do hard things.  Client to utilize BSP (brainspotting) with therapist to help client identify and process triggers for her her depression, anxiety, and abandonment with goal of reducing said SUDs caused by depression/anxiety by 33% in the next 6 months. Client to prioritize sleep 8+ hours each week night AEB going to bed by 10pm each night. Client participated in the treatment planning of their therapy. Client agreed with the plan and understands what to do if there is a crisis: call 9-1-1 and/or crisis line given by therapist.   Pauline Good, LCSW, LCAS, CCTP, CCS-I, BSP

## 2019-05-25 ENCOUNTER — Ambulatory Visit (INDEPENDENT_AMBULATORY_CARE_PROVIDER_SITE_OTHER): Payer: BC Managed Care – PPO | Admitting: Addiction (Substance Use Disorder)

## 2019-05-25 ENCOUNTER — Other Ambulatory Visit: Payer: Self-pay

## 2019-05-25 DIAGNOSIS — F4310 Post-traumatic stress disorder, unspecified: Secondary | ICD-10-CM

## 2019-05-25 DIAGNOSIS — F411 Generalized anxiety disorder: Secondary | ICD-10-CM | POA: Diagnosis not present

## 2019-05-25 DIAGNOSIS — F4325 Adjustment disorder with mixed disturbance of emotions and conduct: Secondary | ICD-10-CM | POA: Diagnosis not present

## 2019-05-25 NOTE — Progress Notes (Signed)
      [facility] Counselor/Therapist Progress Note  Patient ID: FLOWER FRANKO, MRN: [id number],    Date: 05/25/2019  Time Spent: 8:05-9:01 56 mins  Treatment Type: Individual Therapy  Reported Symptoms: anxious, emotionally eating, sad, sleep troubles.    Mental Status Exam:  Appearance:   Casual     Behavior:  Sharing  Motor:  Normal  Speech/Language:   Clear and Coherent  Affect:  Appropriate  Mood:  euthymic and calm  Thought process:  normal  Thought content:    Rumination  Sensory/Perceptual disturbances:    WNL  Orientation:  x4  Attention:  Good  Concentration:  Fair  Memory:  WNL  Fund of knowledge:   Good  Insight:    Good  Judgment:   Good  Impulse Control:  Good   Risk Assessment: Danger to Self:  No- No SI. No plan, no means, no intent.  Self-injurious Behavior: No Danger to Others: No Duty to Warn:no Physical Aggression / Violence:No  Access to Firearms a concern: No  Gang Involvement:No   Subjective: Client in reporting feeling anxious and needing to rely more on her medication and CES therapy to help her manage her emotions and regulate her ability to sleep and cope emotionally. Therapist used MI to validate client and provide space for her to process. Client further discussed more of her grief and her need for connection and comfort as she grieves. Client identified emotional eating and therapist used grief therapy to help client continue to process and provide healing. Client discussed some self-talk distressing her and therapist used CBT to offer support for client to challenge negative ruminations.   Interventions: Cognitive Behavioral Therapy, Motivational Interviewing and Grief Therapy  Diagnosis:   ICD-10-CM   1. Adjustment disorder with mixed disturbance of emotions and conduct  F43.25   2. PTSD (post-traumatic stress disorder)  F43.10   3. Generalized anxiety disorder  F41.1     Plan of Care: Client to return for weekly therapy with Zoila Shutter, therapist, to review again in 6 months. Client is to being seeing medication provider for support of mood management for depression, passive SI, and insomnia.    Client to engage in CBT: challenging negative internal ruminationsand self-talk AEB journaling daily or expressing thoughts to support persons in their life and then challenging it with truth.  Client to engage in tapping to tap in healthy cognition that challenges negative rumination or deep negative core belief.  Client to practice DBT distress tolerance skills to decrease crying spells and thoughts of not being able to endure their suffering AEB using mindfulness, deep breathing, and TIP to increase tolerance for discomfort, discharge emotional distress, and increase their understanding that they can do hard things.  Client to utilize BSP (brainspotting) with therapist to help client identify and process triggers for her her depression, anxiety, and abandonment with goal of reducing said SUDs caused by depression/anxiety by 33% in the next 6 months. Client to prioritize sleep 8+ hours each week night AEB going to bed by 10pm each night. Client participated in the treatment planning of their therapy. Client agreed with the plan and understands what to do if there is a crisis: call 9-1-1 and/or crisis line given by therapist.   Pauline Good, LCSW, LCAS, CCTP, CCS-I, BSP

## 2019-05-27 ENCOUNTER — Encounter (INDEPENDENT_AMBULATORY_CARE_PROVIDER_SITE_OTHER): Payer: Self-pay

## 2019-05-27 ENCOUNTER — Encounter: Payer: Self-pay | Admitting: Addiction (Substance Use Disorder)

## 2019-06-01 ENCOUNTER — Ambulatory Visit (INDEPENDENT_AMBULATORY_CARE_PROVIDER_SITE_OTHER): Payer: BC Managed Care – PPO | Admitting: Addiction (Substance Use Disorder)

## 2019-06-01 ENCOUNTER — Ambulatory Visit: Payer: BC Managed Care – PPO

## 2019-06-01 ENCOUNTER — Other Ambulatory Visit: Payer: Self-pay

## 2019-06-01 DIAGNOSIS — F4325 Adjustment disorder with mixed disturbance of emotions and conduct: Secondary | ICD-10-CM

## 2019-06-01 DIAGNOSIS — F411 Generalized anxiety disorder: Secondary | ICD-10-CM

## 2019-06-01 NOTE — Progress Notes (Signed)
      [facility] Counselor/Therapist Progress Note  Patient ID: Monica [name], MRN: [id number],    Date: 06/01/2019  Time Spent: 8:07-9:00 53 mins  Treatment Type: Individual Therapy  Reported Symptoms: low self-esteem issues, sadness, anxiety, emotional eating.    Mental Status Exam:  Appearance:   Neat     Behavior:  Appropriate and Sharing  Motor:  Normal  Speech/Language:   Clear and Coherent  Affect:  Appropriate  Mood:  anxious and sad  Thought process:  normal  Thought content:    Rumination  Sensory/Perceptual disturbances:    WNL  Orientation:  x4  Attention:  Good  Concentration:  Fair  Memory:  WNL  Fund of knowledge:   Good  Insight:    Good  Judgment:   Good  Impulse Control:  Good   Risk Assessment: Danger to Self:  No- No SI. No plan, no means, no intent.  Self-injurious Behavior: No Danger to Others: No Duty to Warn:no Physical Aggression / Violence:No  Access to Firearms a concern: No  Gang Involvement:No   Subjective: Client reported low self-esteem issues, sadness/ anxiety, and emotional eating.  Client processed the emotions that shes aware of and the behaviors it leads her to. Client struggling with shame around this and is trying to use it to motivate her. Therapist used MI to support client and reflect her thoughts back to her to consider and CBT to help client challenge the thoughts that lead to shame. Therapist used Grief therapy and MI to continue to provide client with support for her life stressors and validate her feelings.   Interventions: Cognitive Behavioral Therapy, Motivational Interviewing and Grief Therapy  Diagnosis:   ICD-10-CM   1. Adjustment disorder with mixed disturbance of emotions and conduct  F43.25   2. Generalized anxiety disorder  F41.1     Plan of Care: Client to return for weekly therapy with Zoila Shutter, therapist, to review again in 6 months. Client is to being seeing medication provider for support of mood  management for depression, passive SI, and insomnia.    Client to engage in CBT: challenging negative internal ruminationsand self-talk AEB journaling daily or expressing thoughts to support persons in their life and then challenging it with truth.  Client to engage in tapping to tap in healthy cognition that challenges negative rumination or deep negative core belief.  Client to practice DBT distress tolerance skills to decrease crying spells and thoughts of not being able to endure their suffering AEB using mindfulness, deep breathing, and TIP to increase tolerance for discomfort, discharge emotional distress, and increase their understanding that they can do hard things.  Client to utilize BSP (brainspotting) with therapist to help client identify and process triggers for her her depression, anxiety, and abandonment with goal of reducing said SUDs caused by depression/anxiety by 33% in the next 6 months. Client to prioritize sleep 8+ hours each week night AEB going to bed by 10pm each night. Client participated in the treatment planning of their therapy. Client agreed with the plan and understands what to do if there is a crisis: call 9-1-1 and/or crisis line given by therapist.   Pauline Good, LCSW, LCAS, CCTP, CCS-I, BSP

## 2019-06-07 ENCOUNTER — Other Ambulatory Visit: Payer: Self-pay

## 2019-06-07 ENCOUNTER — Ambulatory Visit (INDEPENDENT_AMBULATORY_CARE_PROVIDER_SITE_OTHER): Payer: BC Managed Care – PPO | Admitting: Addiction (Substance Use Disorder)

## 2019-06-07 ENCOUNTER — Encounter: Payer: Self-pay | Admitting: Addiction (Substance Use Disorder)

## 2019-06-07 DIAGNOSIS — F4325 Adjustment disorder with mixed disturbance of emotions and conduct: Secondary | ICD-10-CM | POA: Diagnosis not present

## 2019-06-07 DIAGNOSIS — F4310 Post-traumatic stress disorder, unspecified: Secondary | ICD-10-CM

## 2019-06-07 DIAGNOSIS — F411 Generalized anxiety disorder: Secondary | ICD-10-CM

## 2019-06-07 NOTE — Progress Notes (Signed)
      [facility] Counselor/Therapist Progress Note  Patient ID: AIVY AKTER, MRN: [id number],    Date: 06/07/2019  Time Spent: 8:05-9:00 55 mins  Treatment Type: Individual Therapy  Reported Symptoms: motivated, hopeful, proud of herself.    Mental Status Exam:  Appearance:   Well Groomed     Behavior:  Appropriate and Sharing  Motor:  Normal  Speech/Language:   Clear and Coherent  Affect:  Appropriate  Mood:  anxious and sad  Thought process:  normal  Thought content:    Rumination  Sensory/Perceptual disturbances:    WNL  Orientation:  x4  Attention:  Good  Concentration:  Fair  Memory:  WNL  Fund of knowledge:   Good  Insight:    Good  Judgment:   Good  Impulse Control:  Good   Risk Assessment: Danger to Self:  No- No SI. No plan, no means, no intent.  Self-injurious Behavior: No Danger to Others: No Duty to Warn:no Physical Aggression / Violence:No  Access to Firearms a concern: No  Gang Involvement:No   Subjective: Client reported feeling "good"- emotionally and physically. She processed her new found motivation and hopefulness. Therapist used MI to provide support and inquire about her inner motivation. Client shared how she is working to do things to make her feel good and is working to think about things that make her proud of herself. Therapist used CBT to support client in her cognitive dissonance. Client processed details of her motivation/attitude change related to changing her healthy habits, esp her eating habits. Therapist used Solution focused therapy to help break down details of her SMART goals.  Interventions: Cognitive Behavioral Therapy, Motivational Interviewing and Solution-Oriented/Positive Psychology & psychotherapy.  Diagnosis:   ICD-10-CM   1. PTSD (post-traumatic stress disorder)  F43.10   2. Generalized anxiety disorder  F41.1   3. Adjustment disorder with mixed disturbance of emotions and conduct  F43.25    Plan of Care: Client to  return for weekly therapy with Zoila Shutter, therapist, to review again in 6 months. Client is to being seeing medication provider for support of mood management for depression, passive SI, and insomnia.    Client to engage in CBT: challenging negative internal ruminationsand self-talk AEB journaling daily or expressing thoughts to support persons in their life and then challenging it with truth.  Client to engage in tapping to tap in healthy cognition that challenges negative rumination or deep negative core belief.  Client to practice DBT distress tolerance skills to decrease crying spells and thoughts of not being able to endure their suffering AEB using mindfulness, deep breathing, and TIP to increase tolerance for discomfort, discharge emotional distress, and increase their understanding that they can do hard things.  Client to utilize BSP (brainspotting) with therapist to help client identify and process triggers for her her depression, anxiety, and abandonment with goal of reducing said SUDs caused by depression/anxiety by 33% in the next 6 months. Client to prioritize sleep 8+ hours each week night AEB going to bed by 10pm each night. Client participated in the treatment planning of their therapy. Client agreed with the plan and understands what to do if there is a crisis: call 9-1-1 and/or crisis line given by therapist.   Pauline Good, LCSW, LCAS, CCTP, CCS-I, BSP

## 2019-06-15 ENCOUNTER — Ambulatory Visit (INDEPENDENT_AMBULATORY_CARE_PROVIDER_SITE_OTHER): Payer: BC Managed Care – PPO | Admitting: Addiction (Substance Use Disorder)

## 2019-06-15 ENCOUNTER — Other Ambulatory Visit: Payer: Self-pay

## 2019-06-15 ENCOUNTER — Encounter: Payer: Self-pay | Admitting: Addiction (Substance Use Disorder)

## 2019-06-15 DIAGNOSIS — F4325 Adjustment disorder with mixed disturbance of emotions and conduct: Secondary | ICD-10-CM

## 2019-06-15 NOTE — Progress Notes (Signed)
[facility] Counselor/Therapist Progress Note  Patient ID: Monica [name], MRN: [id number],    Date: 06/15/2019  Time Spent: 9:05-10:02  Treatment Type: Individual Therapy  Reported Symptoms: motivated, less stressed & sad.   Mental Status Exam:  Appearance:   Neat     Behavior:  Appropriate and Sharing  Motor:  Normal  Speech/Language:   Clear and Coherent  Affect:  Appropriate  Mood:  angry and euthymic  Thought process:  normal  Thought content:    WNL  Sensory/Perceptual disturbances:    WNL  Orientation:  x4  Attention:  Good  Concentration:  Good  Memory:  WNL  Fund of knowledge:   Good  Insight:    Good  Judgment:   Good  Impulse Control:  Good   Risk Assessment: Danger to Self:  No- No SI. No plan, no means, no intent.  Self-injurious Behavior: No Danger to Others: No Duty to Warn:no Physical Aggression / Violence:No  Access to Firearms a concern: No  Gang Involvement:No   Subjective: Client reported being more motivated, and less stressed/depression. Client processed what she had been working on with nutritionists for her physical health and her progress. Therapist used SFT with client to continue building more SMART goals with client for her health. Client still have avoidant behavior related to seeing [name] her ex, but is much less emotional when thinking of him not contacting her & being emotionally supportive in any way in the past also.  Client struggling with bitterness that her ex was back with his ex who she hates. Therapist used MI & CBT to support client and explore her feelings & thoughts that trigger her to be bitter. Client said she asks him in her head, "why would you let such a toxic person back in your life. You did that in our life too & never realized how toxic you are." Client processed her loneliness and her thought that he is lonely and just didn't care about her, causing her to grieve more & disconnect from him.   Interventions:  Cognitive Behavioral Therapy, Motivational Interviewing and Solution-Oriented/Positive Psychology   Diagnosis:   ICD-10-CM   1. Adjustment disorder with mixed disturbance of emotions and conduct  F43.25    Plan of Care: Client to return for weekly therapy with Zoila Shutter, therapist, to review again in 6 months. Client is to being seeing medication provider for support of mood management for depression, passive SI, and insomnia.    Client to engage in CBT: challenging negative internal ruminationsand self-talk AEB journaling daily or expressing thoughts to support persons in their life and then challenging it with truth.  Client to engage in tapping to tap in healthy cognition that challenges negative rumination or deep negative core belief.  Client to practice DBT distress tolerance skills to decrease crying spells and thoughts of not being able to endure their suffering AEB using mindfulness, deep breathing, and TIP to increase tolerance for discomfort, discharge emotional distress, and increase their understanding that they can do hard things.  Client to utilize BSP (brainspotting) with therapist to help client identify and process triggers for her her depression, anxiety, and abandonment with goal of reducing said SUDs caused by depression/anxiety by 33% in the next 6 months. Client to prioritize sleep 8+ hours each week night AEB going to bed by 10pm each night. Client participated in the treatment planning of their therapy. Client agreed with the plan and understands what to do if there is  a crisis: call 9-1-1 and/or crisis line given by therapist.   Barnie Del, LCSW, LCAS, CCTP, CCS-I, BSP

## 2019-06-22 ENCOUNTER — Encounter: Payer: Self-pay | Admitting: Addiction (Substance Use Disorder)

## 2019-06-22 ENCOUNTER — Ambulatory Visit (INDEPENDENT_AMBULATORY_CARE_PROVIDER_SITE_OTHER): Payer: BC Managed Care – PPO | Admitting: Addiction (Substance Use Disorder)

## 2019-06-22 ENCOUNTER — Other Ambulatory Visit: Payer: Self-pay

## 2019-06-22 DIAGNOSIS — F411 Generalized anxiety disorder: Secondary | ICD-10-CM

## 2019-06-22 DIAGNOSIS — F4310 Post-traumatic stress disorder, unspecified: Secondary | ICD-10-CM | POA: Diagnosis not present

## 2019-06-22 NOTE — Progress Notes (Signed)
[facility] Counselor/Therapist Progress Note  Patient ID: Monica [name], MRN: [id number],    Date: 06/22/2019  Time Spent: 8:05-9:00  Treatment Type: Individual Therapy  Reported Symptoms: anxiety, frustration, fear.  Mental Status Exam:  Appearance:   Well Groomed     Behavior:  Appropriate and Sharing  Motor:  Normal  Speech/Language:   Clear and Coherent  Affect:  Appropriate  Mood:  anxious and irritable  Thought process:  normal  Thought content:    WNL  Sensory/Perceptual disturbances:    WNL  Orientation:  x4  Attention:  Good  Concentration:  Good  Memory:  WNL  Fund of knowledge:   Good  Insight:    Good  Judgment:   Good  Impulse Control:  Good   Risk Assessment: Danger to Self:  No- No SI. No plan, no means, no intent.  Self-injurious Behavior: No Danger to Others: No Duty to Warn:no Physical Aggression / Violence:No  Access to Firearms a concern: No  Gang Involvement:No   Subjective: Client identified having a lot of anxiety, frustration, and fear. Client reported past experiences in therapy and her lack of growth in the past due to having trauma caused by family. Client identified how she was flooded with traumatic memories and sensations after her last EMDR session and was inconsolable; she shared she has been emotionally dysregulated ever since (3 years back). Therapist worked with client using MI to validate the pain and trauma she has experienced and normalize her struggle with adapting and remaining calm and content. Client described her reality of depression and anxiety most of her life with increasing flashbacks and emotional lability. Therapist used psychoeducation and CBT to normalize client's experience and help her understand why feelings she has from past traumas cause her to have negative cognitions and those cause her to act out of hopelessness. Therapist worked with client to find solution to this and counter it with  self-affirmations (using SFT & CBT). Client made progress in session and denied SI/HI/AVH.  Interventions: Cognitive Behavioral Therapy, Motivational Interviewing and Solution-Oriented/Positive Psychology   Diagnosis:   ICD-10-CM   1. PTSD (post-traumatic stress disorder)  F43.10   2. Generalized anxiety disorder  F41.1    Plan of Care: Client to return for weekly therapy with Zoila Shutter, therapist, to review again in 6 months. Client is to being seeing medication provider for support of mood management for depression, passive SI, and insomnia.    Client to engage in CBT: challenging negative internal ruminationsand self-talk AEB journaling daily or expressing thoughts to support persons in their life and then challenging it with truth.  Client to engage in tapping to tap in healthy cognition that challenges negative rumination or deep negative core belief.  Client to practice DBT distress tolerance skills to decrease crying spells and thoughts of not being able to endure their suffering AEB using mindfulness, deep breathing, and TIP to increase tolerance for discomfort, discharge emotional distress, and increase their understanding that they can do hard things.  Client to utilize BSP (brainspotting) with therapist to help client identify and process triggers for her her depression, anxiety, and abandonment with goal of reducing said SUDs caused by depression/anxiety by 33% in the next 6 months. Client to prioritize sleep 8+ hours each week night AEB going to bed by 10pm each night. Client participated in the treatment planning of their therapy. Client agreed with the plan and understands what to do if there is a crisis: call 9-1-1  and/or crisis line given by therapist.   Barnie Del, LCSW, LCAS, CCTP, CCS-I, BSP

## 2019-06-29 ENCOUNTER — Ambulatory Visit (INDEPENDENT_AMBULATORY_CARE_PROVIDER_SITE_OTHER): Payer: BC Managed Care – PPO | Admitting: Addiction (Substance Use Disorder)

## 2019-06-29 ENCOUNTER — Other Ambulatory Visit: Payer: Self-pay

## 2019-06-29 ENCOUNTER — Encounter: Payer: Self-pay | Admitting: Addiction (Substance Use Disorder)

## 2019-06-29 DIAGNOSIS — F411 Generalized anxiety disorder: Secondary | ICD-10-CM | POA: Diagnosis not present

## 2019-06-29 DIAGNOSIS — F4310 Post-traumatic stress disorder, unspecified: Secondary | ICD-10-CM

## 2019-06-29 NOTE — Progress Notes (Signed)
      [facility] Counselor/Therapist Progress Note  Patient ID: Monica [name], MRN: [id number],    Date: 06/29/2019  Time Spent: 8:03-9:00  Treatment Type: Individual Therapy  Reported Symptoms: relief, less stressed, sad/grief.   Mental Status Exam:  Appearance:   Casual     Behavior:  Appropriate and Sharing  Motor:  Normal  Speech/Language:   Clear and Coherent  Affect:  Appropriate  Mood:  sad  Thought process:  normal  Thought content:    WNL  Sensory/Perceptual disturbances:    WNL  Orientation:  x4  Attention:  Good  Concentration:  Good  Memory:  WNL  Fund of knowledge:   Good  Insight:    Good  Judgment:   Good  Impulse Control:  Good   Risk Assessment: Danger to Self:  No- No SI. No plan, no means, no intent.  Self-injurious Behavior: No Danger to Others: No Duty to Warn:no Physical Aggression / Violence:No  Access to Firearms a concern: No  Gang Involvement:No   Subjective: Client reported feeling less stressed but recently triggered by more grief of being removed off her old step-son's pickup list by her ex's. Client processed the pain and the therapist used MI and mindfulness to support client in her pain and to help ground her emotionally. Therapist also led client in a grounding exercise using trauma-release methods. Therapist used yoga to demonstrate them for client and client participated in the treatment exercise, reporting having found relief following the session.  Client made progress in session. Therapist assessed for stability and safety and client denied SI/HI/AVH.  Interventions: Cognitive Behavioral Therapy, Motivational Interviewing and trauma release exercises from [employer]   Diagnosis:   ICD-10-CM   1. PTSD (post-traumatic stress disorder)  F43.10   2. Generalized anxiety disorder  F41.1    Plan of Care: Client to return for weekly therapy with Zoila Shutter, therapist, to review again in 6 months. Client is to being seeing  medication provider for support of mood management for depression, passive SI, and insomnia.    Client to engage in CBT: challenging negative internal ruminationsand self-talk AEB journaling daily or expressing thoughts to support persons in their life and then challenging it with truth.  Client to engage in tapping to tap in healthy cognition that challenges negative rumination or deep negative core belief.  Client to practice DBT distress tolerance skills to decrease crying spells and thoughts of not being able to endure their suffering AEB using mindfulness, deep breathing, and TIP to increase tolerance for discomfort, discharge emotional distress, and increase their understanding that they can do hard things.  Client to utilize BSP (brainspotting) with therapist to help client identify and process triggers for her her depression, anxiety, and abandonment with goal of reducing said SUDs caused by depression/anxiety by 33% in the next 6 months. Client to prioritize sleep 8+ hours each week night AEB going to bed by 10pm each night. Client participated in the treatment planning of their therapy. Client agreed with the plan and understands what to do if there is a crisis: call 9-1-1 and/or crisis line given by therapist.   Pauline Good, LCSW, LCAS, CCTP, CCS-I, BSP

## 2019-07-05 ENCOUNTER — Encounter: Payer: Self-pay | Admitting: Adult Health

## 2019-07-05 ENCOUNTER — Ambulatory Visit (INDEPENDENT_AMBULATORY_CARE_PROVIDER_SITE_OTHER): Payer: BC Managed Care – PPO | Admitting: Adult Health

## 2019-07-05 ENCOUNTER — Other Ambulatory Visit: Payer: Self-pay

## 2019-07-05 DIAGNOSIS — F4310 Post-traumatic stress disorder, unspecified: Secondary | ICD-10-CM | POA: Diagnosis not present

## 2019-07-05 DIAGNOSIS — F411 Generalized anxiety disorder: Secondary | ICD-10-CM | POA: Diagnosis not present

## 2019-07-05 DIAGNOSIS — G4700 Insomnia, unspecified: Secondary | ICD-10-CM

## 2019-07-05 DIAGNOSIS — F331 Major depressive disorder, recurrent, moderate: Secondary | ICD-10-CM

## 2019-07-05 DIAGNOSIS — F909 Attention-deficit hyperactivity disorder, unspecified type: Secondary | ICD-10-CM

## 2019-07-05 DIAGNOSIS — F4325 Adjustment disorder with mixed disturbance of emotions and conduct: Secondary | ICD-10-CM

## 2019-07-05 MED ORDER — CLONAZEPAM 1 MG PO TABS: ORAL_TABLET | ORAL | 2 refills | Status: DC

## 2019-07-05 MED ORDER — TRAZODONE HCL 100 MG PO TABS: ORAL_TABLET | ORAL | 1 refills | Status: DC

## 2019-07-05 MED ORDER — ESCITALOPRAM OXALATE 5 MG PO TABS: 5.0000 mg | ORAL_TABLET | Freq: Every day | ORAL | 1 refills | Status: DC

## 2019-07-05 NOTE — Progress Notes (Signed)
Monica [name] [id number] 14-Jul-1970 49 y.o.  Subjective:   Patient ID:  Monica [name] is a 49 y.o. (DOB 15-Apr-1970) female.  Chief Complaint: No chief complaint on file.   HPI Monica [name] presents to the office today for follow-up of MDD, GAD, PTSD, Insomnia, panic attacks, and ADHD.  Describes mood today as "ok". Pleasant. Mood symptoms - denies depression and irritability. Feels anxious at times. Stating "things are going good". Had started "binge eating". Seeing a nutritionist - setting goals. Has periods of high anxiety. Gets overwhelmed. People depending on her. Stating "I feel like I'm going to jump out of my skin". Feels like the Lexapro has "helped to "balance" her. Seeing therapist and using coping strategies.  Energy [name] improved. Active, has a regular exercise routine. Walking dog. 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  Enjoys some usual interests and activities. Single. Lives alone. Has a 17 year old daughter at [facility] - returns home on May 12th. Plans to go to the beach and visit family. Brother lives in [place] and sister in [place]. Has supportive friends.  Appetite adequate. Has periods of "binge appetite". Weight gain and loss - gain overall. Sleeps well most nights. Averages 8 hours. Focus and concentration has improved. Completing tasks. Managing aspects of household. Work going well.  Denies SI or HI. Denies AH or VH.  Previous medication trials: Adderall, Trazadone, Clonazepam  Review of Systems:  Review of Systems  Musculoskeletal: Negative for gait problem.  Neurological: Negative for tremors.  Psychiatric/Behavioral:       Please refer to HPI    Medications: I have reviewed the patient's current medications.  Current Outpatient Medications  Medication Sig Dispense Refill  . albuterol (PROVENTIL HFA;VENTOLIN HFA) 108 (90 Base) MCG/ACT inhaler Inhale 2 puffs into the lungs every 6 (six) hours as needed for wheezing or shortness  of breath.    . cetirizine (ZYRTEC) 10 MG tablet Take 10 mg by mouth daily.    . clonazePAM (KLONOPIN) 1 MG tablet Take 1 tablet (1 mg total) by mouth daily. 30 tablet 2  . escitalopram (LEXAPRO) 5 MG tablet Take 1 tablet (5 mg total) by mouth daily. 30 tablet 2  . fluticasone (FLONASE) 50 MCG/ACT nasal spray Place 1 spray daily into both nostrils.    . Norethin Ace-Eth Estrad-FE 1-20 MG-MCG(24) CAPS Take 1 capsule by mouth daily.     . traZODone (DESYREL) 100 MG tablet Take one to two tablets at bedtime. 60 tablet 5  . valACYclovir (VALTREX) 500 MG tablet Take 500 mg by mouth 2 (two) times daily as needed (flare up).      No current facility-administered medications for this visit.    Medication Side Effects: None  Allergies:  Allergies  Allergen Reactions  . Sertraline Other (See Comments)    Felt on emotion  . Pepto-Bismol [Bismuth Subsalicylate] Rash  . Septra [Sulfamethoxazole-Trimethoprim] Rash    Past Medical History:  Diagnosis Date  . ADD (attention deficit disorder)   . Anxiety disorder   . Heart murmur   . Herpes simplex type 1 infection   . Insomnia    unspecified  . Migraines   . MRSA infection    left great toe  . PTSD (post-traumatic stress disorder)   . Seasonal allergies     Family History  Problem Relation Age of Onset  . Bipolar disorder Mother   . Lung cancer Father   . Migraines Neg Hx     Social  History   Socioeconomic History  . Marital status: Divorced    Spouse name: Not on file  . Number of children: 1  . Years of education: Not on file  . Highest education level: Bachelor's degree (e.g., BA, AB, BS)  Occupational History  . Not on file  Tobacco Use  . Smoking status: Former Smoker    Types: Cigarettes    Quit date: 07/30/2011    Years since quitting: 7.9  . Smokeless tobacco: Never Used  Substance and Sexual Activity  . Alcohol use: Not Currently    Comment: occasional  . Drug use: No  . Sexual activity: Not on file  Other  Topics Concern  . Not on file  Social History Narrative   Lives at home with fiance   Right handed   2 cups of caffeine daily   Social Determinants of Health   Financial Resource Strain:   . Difficulty of Paying Living Expenses:   Food Insecurity:   . Worried About Programme researcher, broadcasting/film/video in the Last Year:   . Barista in the Last Year:   Transportation Needs:   . Freight forwarder (Medical):   Marland Kitchen Lack of Transportation (Non-Medical):   Physical Activity:   . Days of Exercise per Week:   . Minutes of Exercise per Session:   Stress:   . Feeling of Stress :   Social Connections:   . Frequency of Communication with Friends and Family:   . Frequency of Social Gatherings with Friends and Family:   . Attends Religious Services:   . Active Member of Clubs or Organizations:   . Attends Banker Meetings:   Marland Kitchen Marital Status:   Intimate Partner Violence:   . Fear of Current or Ex-Partner:   . Emotionally Abused:   Marland Kitchen Physically Abused:   . Sexually Abused:     Past Medical History, Surgical history, Social history, and Family history were reviewed and updated as appropriate.   Please see review of systems for further details on the patient's review from today.   Objective:   Physical Exam:  There were no vitals taken for this visit.  Physical Exam Constitutional:      General: She is not in acute distress. Musculoskeletal:        General: No deformity.  Skin:    Findings: No erythema.  Neurological:     Mental Status: She is alert and oriented to person, place, and time.     Coordination: Coordination normal.  Psychiatric:        Attention and Perception: Attention and perception normal. She does not perceive auditory or visual hallucinations.        Mood and Affect: Mood normal. Mood is not anxious or depressed. Affect is not labile, blunt, angry or inappropriate.        Speech: Speech normal.        Behavior: Behavior normal.        Thought  Content: Thought content normal. Thought content is not paranoid or delusional. Thought content does not include homicidal or suicidal ideation. Thought content does not include homicidal or suicidal plan.        Cognition and Memory: Cognition and memory normal.        Judgment: Judgment normal.     Comments: Insight intact     Lab Review:     Component Value Date/Time   NA 137 04/01/2019 2236   K 3.3 (L) 04/01/2019 2236   CL  101 04/01/2019 2236   CO2 26 04/01/2019 2236   GLUCOSE 108 (H) 04/01/2019 2236   BUN 7 04/01/2019 2236   CREATININE 0.84 04/01/2019 2236   CALCIUM 8.8 (L) 04/01/2019 2236   PROT 7.5 04/01/2019 2236   ALBUMIN 3.9 04/01/2019 2236   AST 20 04/01/2019 2236   ALT 14 04/01/2019 2236   ALKPHOS 36 (L) 04/01/2019 2236   BILITOT 0.4 04/01/2019 2236   GFRNONAA >60 04/01/2019 2236   GFRAA >60 04/01/2019 2236       Component Value Date/Time   WBC 8.5 04/01/2019 2236   RBC 4.21 04/01/2019 2236   HGB 12.4 04/01/2019 2236   HCT 37.9 04/01/2019 2236   PLT 340 04/01/2019 2236   MCV 90.0 04/01/2019 2236   MCH 29.5 04/01/2019 2236   MCHC 32.7 04/01/2019 2236   RDW 13.6 04/01/2019 2236   LYMPHSABS 1.4 07/23/2015 1038   MONOABS 0.4 07/23/2015 1038   EOSABS 0.0 07/23/2015 1038   BASOSABS 0.0 07/23/2015 1038    No results found for: POCLITH, LITHIUM   No results found for: PHENYTOIN, PHENOBARB, VALPROATE, CBMZ   .res Assessment: Plan:    Plan:  PDMP reviewed  1. Lexapro 5mg  daily  2. Clonazepam 1mg  to 1.5mg  at hs 3. Trazdone 100mg  - 2 at hs  RTC 4 weeks  Patient advised to contact office with any questions, adverse effects, or acute worsening in signs and symptoms.  Discussed potential benefits, risk, and side effects of benzodiazepines to include potential risk of tolerance and dependence, as well as possible drowsiness.  Advised patient not to drive if experiencing drowsiness and to take lowest possible effective dose to minimize risk of dependence and  tolerance.   There are no diagnoses linked to this encounter.   Please see After Visit Summary for patient specific instructions.  Future Appointments  Date Time Provider Department Center  07/20/2019  8:10 AM [facility] 2 [facility]  08/10/2019  8:00 AM , LCSW [facility] None  08/17/2019 11:00 AM 10/10/2019, LCSW [facility] None  08/24/2019  9:00 AM 10/17/2019, LCSW [facility] None  08/31/2019  9:00 AM 08/26/2019, LCSW CP-[facility] None  09/07/2019  9:00 AM 09/02/2019, LCSW CP-CP None    No orders of the defined types were placed in this encounter.   -------------------------------

## 2019-07-06 ENCOUNTER — Ambulatory Visit: Payer: BC Managed Care – PPO | Admitting: Adult Health

## 2019-07-06 ENCOUNTER — Ambulatory Visit: Payer: BC Managed Care – PPO | Admitting: Addiction (Substance Use Disorder)

## 2019-07-13 ENCOUNTER — Ambulatory Visit: Payer: BC Managed Care – PPO

## 2019-07-19 ENCOUNTER — Ambulatory Visit (INDEPENDENT_AMBULATORY_CARE_PROVIDER_SITE_OTHER): Payer: BC Managed Care – PPO | Admitting: Addiction (Substance Use Disorder)

## 2019-07-19 ENCOUNTER — Other Ambulatory Visit: Payer: Self-pay

## 2019-07-19 ENCOUNTER — Encounter: Payer: Self-pay | Admitting: Addiction (Substance Use Disorder)

## 2019-07-19 DIAGNOSIS — F4310 Post-traumatic stress disorder, unspecified: Secondary | ICD-10-CM

## 2019-07-19 DIAGNOSIS — F410 Panic disorder [episodic paroxysmal anxiety] without agoraphobia: Secondary | ICD-10-CM

## 2019-07-19 DIAGNOSIS — F430 Acute stress reaction: Secondary | ICD-10-CM | POA: Diagnosis not present

## 2019-07-19 NOTE — Progress Notes (Signed)
[facility] Counselor/Therapist Progress Note  Patient ID: Monica [name], MRN: [id number],    Date: 07/19/2019  Time Spent: 3:13-4:00 47 mins  Treatment Type: Individual Therapy  Reported Symptoms: panic attacks recently, flashbacks, anxiety.   Mental Status Exam:  Appearance:   Neat and Well Groomed     Behavior:  Appropriate and Sharing  Motor:  Normal  Speech/Language:   Clear and Coherent  Affect:  Appropriate  Mood:  anxious and labile  Thought process:  normal  Thought content:    WNL  Sensory/Perceptual disturbances:    WNL  Orientation:  x4  Attention:  Good  Concentration:  Good  Memory:  WNL  Fund of knowledge:   Good  Insight:    Good  Judgment:   Good  Impulse Control:  Good   Risk Assessment: Danger to Self:  No- No SI. No plan, no means, no intent.  Self-injurious Behavior: No Danger to Others: No Duty to Warn:no Physical Aggression / Violence:No  Access to Firearms a concern: No  Gang Involvement:No   Subjective: Client reported having more panic attacks recently, flashbacks, and increased anxiety esp on fridays or weekends when she doesn't have to work or mother/do family related things. Client identified it being scary to just feel uneasy about doing things just for herself. Therapist used MI to normalize things for client such as her adjustment to life changes and to help her consider healthy new rhythms she can add to her summer while off from work. Client identified feeling scared internally when feeling excited about something new shes doing in her life. Therapist used psychoeducation to teach client why she may be having those responses: Physiological response (panic attack feelings and racing heartrate) due to a fight or flight state triggered by something as simple as a workout or feeling excited about something new. Client identified more of her need for routine, meaningful plans, and less things that cause uneasiness/not feeling sure.  Therapist reflected back to client her need for mindfulness: of sitting still with herself and noticing her body sensations and thoughts. Client said: "ive never felt so carefree before". Client made progress calming in session and identified feeling both extremes in her body: a new sense of calm in her body and a feeling of uneasiness. Therapist also used CBT with client to help her understand more of her thoughts connecting to the uneasiness. Therapist assessed for stability and safety, & client denied SI/HI/AVH.  Interventions: Cognitive Behavioral Therapy, Mindfulness Meditation, Motivational Interviewing and trauma release exercises from [employer]   Diagnosis:   ICD-10-CM   1. PTSD (post-traumatic stress disorder)  F43.10   2. Panic attack as reaction to stress  F41.0    F43.0    Plan of Care: Client to return for weekly therapy with Sammuel Cooper, therapist, to review again in 6 months. Client is to being seeing medication provider for support of mood management for depression, passive SI, and insomnia.    Client to engage in CBT: challenging negative internal ruminationsand self-talk AEB journaling daily or expressing thoughts to support persons in their life and then challenging it with truth.  Client to engage in tapping to tap in healthy cognition that challenges negative rumination or deep negative core belief.  Client to practice DBT distress tolerance skills to decrease crying spells and thoughts of not being able to endure their suffering AEB using mindfulness, deep breathing, and TIP to increase tolerance for discomfort, discharge emotional distress, and increase their  understanding that they can do hard things.  Client to utilize BSP (brainspotting) with therapist to help client identify and process triggers for her her depression, anxiety, and abandonment with goal of reducing said SUDs caused by depression/anxiety by 33% in the next 6 months. Client to prioritize sleep 8+ hours  each week night AEB going to bed by 10pm each night. Client participated in the treatment planning of their therapy. Client agreed with the plan and understands what to do if there is a crisis: call 9-1-1 and/or crisis line given by therapist.   Pauline Good, LCSW, LCAS, CCTP, CCS-I, BSP

## 2019-07-20 ENCOUNTER — Ambulatory Visit
Admission: RE | Admit: 2019-07-20 | Discharge: 2019-07-20 | Disposition: A | Discharge status: Home / Self Care | Payer: BC Managed Care – PPO | Source: Ambulatory Visit | Attending: Obstetrics & Gynecology | Admitting: Obstetrics & Gynecology

## 2019-07-20 DIAGNOSIS — Z1231 Encounter for screening mammogram for malignant neoplasm of breast: Secondary | ICD-10-CM

## 2019-08-10 ENCOUNTER — Ambulatory Visit: Payer: BC Managed Care – PPO | Admitting: Addiction (Substance Use Disorder)

## 2019-08-17 ENCOUNTER — Ambulatory Visit: Payer: BC Managed Care – PPO | Admitting: Addiction (Substance Use Disorder)

## 2019-08-24 ENCOUNTER — Ambulatory Visit (INDEPENDENT_AMBULATORY_CARE_PROVIDER_SITE_OTHER): Payer: BC Managed Care – PPO | Admitting: Addiction (Substance Use Disorder)

## 2019-08-24 ENCOUNTER — Encounter: Payer: Self-pay | Admitting: Addiction (Substance Use Disorder)

## 2019-08-24 ENCOUNTER — Other Ambulatory Visit: Payer: Self-pay

## 2019-08-24 DIAGNOSIS — F411 Generalized anxiety disorder: Secondary | ICD-10-CM | POA: Diagnosis not present

## 2019-08-24 DIAGNOSIS — F410 Panic disorder [episodic paroxysmal anxiety] without agoraphobia: Secondary | ICD-10-CM

## 2019-08-24 DIAGNOSIS — F430 Acute stress reaction: Secondary | ICD-10-CM

## 2019-08-24 NOTE — Progress Notes (Signed)
[facility] Counselor/Therapist Progress Note  Patient ID: Monica [name], MRN: [id number],    Date: 08/24/2019  Time Spent: 9:04-10:00 56 mins  Treatment Type: Individual Therapy  Reported Symptoms: sleeping so good. less despair and depression.    Mental Status Exam:  Appearance:   Well Groomed     Behavior:  Appropriate and Sharing  Motor:  Normal  Speech/Language:   Normal Rate  Affect:  Appropriate  Mood:  normal  Thought process:  normal  Thought content:    WNL  Sensory/Perceptual disturbances:    WNL  Orientation:  x4  Attention:  Good  Concentration:  Good  Memory:  WNL  Fund of knowledge:   Good  Insight:    Good  Judgment:   Good  Impulse Control:  Good   Risk Assessment: Danger to Self:  No- No SI. No plan, no means, no intent.  Self-injurious Behavior: No Danger to Others: No Duty to Warn:no Physical Aggression / Violence:No  Access to Firearms a concern: No  Gang Involvement:No   Subjective: Client reported doing so much better emotionally, regulating her panic attacks and rumination thoughts. Client processed a emotional shutdown that occurred when she got home from a relaxing vacation and had a city code violation from her neighbor. Client almost despaired and freaked out but is reaching out to her supports to process her emotions and support her through it, instead of isolating. 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 Client reported practicing a new coping tool: setting boundaries to take care of herself. Therapist used MI & fftt trauma therapy with client to validate her progress and help her recognize how her traumas have affected her and offering herself compassion. Client made progress asking friends, peers, etc for support and taking a moment to breathe before reacting. her bosses and peers for help. Therapist assessed for stability  and safety, & client denied SI/HI/AVH.  Interventions: Cognitive Behavioral Therapy and Motivational Interviewing   Diagnosis:   ICD-10-CM   1. Panic attack as reaction to stress  F41.0    F43.0   2. Generalized anxiety disorder  F41.1    Plan of Care: Client to return for weekly therapy with Zoila Shutter, therapist, to review again in 6 months. Client is to being seeing medication provider for support of mood management for depression, passive SI, and insomnia.    Client to engage in CBT: challenging negative internal ruminationsand self-talk AEB journaling daily or expressing thoughts to support persons in their life and then challenging it with truth.  Client to engage in tapping to tap in healthy cognition that challenges negative rumination or deep negative core belief.  Client to practice DBT distress tolerance skills to decrease crying spells and thoughts of not being able to endure their suffering AEB using mindfulness, deep breathing, and TIP to increase tolerance for discomfort, discharge emotional distress, and increase their understanding that they can do hard things.  Client to utilize BSP (brainspotting) with therapist to help client identify and process triggers for her her depression, anxiety, and abandonment with goal of reducing said SUDs caused by depression/anxiety by 33% in the next 6 months. Client to prioritize sleep 8+ hours each week night AEB going to bed by 10pm each night. Client participated in the treatment planning of their therapy. Client agreed with the plan and understands what to do if there is  a crisis: call 9-1-1 and/or crisis line given by therapist.   Barnie Del, LCSW, LCAS, CCTP, CCS-I, BSP

## 2019-08-26 ENCOUNTER — Other Ambulatory Visit: Payer: Self-pay | Admitting: Adult Health

## 2019-08-26 DIAGNOSIS — F411 Generalized anxiety disorder: Secondary | ICD-10-CM

## 2019-08-31 ENCOUNTER — Encounter: Payer: Self-pay | Admitting: Addiction (Substance Use Disorder)

## 2019-08-31 ENCOUNTER — Other Ambulatory Visit: Payer: Self-pay

## 2019-08-31 ENCOUNTER — Ambulatory Visit (INDEPENDENT_AMBULATORY_CARE_PROVIDER_SITE_OTHER): Payer: BC Managed Care – PPO | Admitting: Addiction (Substance Use Disorder)

## 2019-08-31 DIAGNOSIS — F4310 Post-traumatic stress disorder, unspecified: Secondary | ICD-10-CM

## 2019-08-31 NOTE — Progress Notes (Signed)
      [facility] Counselor/Therapist Progress Note  Patient ID: Monica [name], MRN: [id number],    Date: 08/31/2019  Time Spent: 9:06-10:00 54 mins  Treatment Type: Individual Therapy  Reported Symptoms:  Stressed, exhausted.    Mental Status Exam:  Appearance:   Casual and Well Groomed     Behavior:  Appropriate and Sharing  Motor:  Normal  Speech/Language:   Normal Rate  Affect:  Appropriate  Mood:  normal  Thought process:  normal  Thought content:    WNL  Sensory/Perceptual disturbances:    WNL  Orientation:  x4  Attention:  Good  Concentration:  Good  Memory:  WNL  Fund of knowledge:   Good  Insight:    Good  Judgment:   Good  Impulse Control:  Good   Risk Assessment: Danger to Self:  No- No SI. No plan, no means, no intent.  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 Client reported feeling that the job is not good for her and thinking she needs a break. Therapist used MI to validate the client's stressors and offer support. Therapist used SFT with client to help her compassionately validate for the client the hard work she has done, and encourage her to make a decision to leave her summer job. Client discussed what its been like to advocate for herself and be vulnerable with others about her needs, including a man she has in her life. Therapist and client discussed this from a fftt trauma perspective. Therapist also assessed for safety /stability and & client denied SI/HI/AVH and reported less depression/anxiety.   Interventions: Motivational Interviewing and Solution-Oriented/Positive Psychology & fftt.  Diagnosis:   ICD-10-CM   1. PTSD (post-traumatic stress disorder)  F43.10    Plan of Care: Client to return for weekly therapy  with Zoila Shutter, therapist, to review again in 6 months. Client is to being seeing medication provider for support of mood management for depression, passive SI, and insomnia.    Client to engage in CBT: challenging negative internal ruminationsand self-talk AEB journaling daily or expressing thoughts to support persons in their life and then challenging it with truth.  Client to engage in tapping to tap in healthy cognition that challenges negative rumination or deep negative core belief.  Client to practice DBT distress tolerance skills to decrease crying spells and thoughts of not being able to endure their suffering AEB using mindfulness, deep breathing, and TIP to increase tolerance for discomfort, discharge emotional distress, and increase their understanding that they can do hard things.  Client to utilize BSP (brainspotting) with therapist to help client identify and process triggers for her her depression, anxiety, and abandonment with goal of reducing said SUDs caused by depression/anxiety by 33% in the next 6 months. Client to prioritize sleep 8+ hours each week night AEB going to bed by 10pm each night. Client participated in the treatment planning of their therapy. Client agreed with the plan and understands what to do if there is a crisis: call 9-1-1 and/or crisis line given by therapist.   Pauline Good, LCSW, LCAS, CCTP, CCS-I, BSP

## 2019-09-07 ENCOUNTER — Other Ambulatory Visit: Payer: Self-pay

## 2019-09-07 ENCOUNTER — Ambulatory Visit (INDEPENDENT_AMBULATORY_CARE_PROVIDER_SITE_OTHER): Payer: BC Managed Care – PPO | Admitting: Addiction (Substance Use Disorder)

## 2019-09-07 ENCOUNTER — Encounter: Payer: Self-pay | Admitting: Addiction (Substance Use Disorder)

## 2019-09-07 DIAGNOSIS — F411 Generalized anxiety disorder: Secondary | ICD-10-CM | POA: Diagnosis not present

## 2019-09-07 DIAGNOSIS — F4310 Post-traumatic stress disorder, unspecified: Secondary | ICD-10-CM

## 2019-09-07 NOTE — Progress Notes (Signed)
[facility] Counselor/Therapist Progress Note  Patient ID: Monica [name], MRN: [id number],    Date: 09/07/2019  Time Spent: 9:05-10:00  Treatment Type: Individual Therapy  Reported Symptoms:  Calmer, sensitive to the part of her that feels abandoned/fearful of losing another person.   Mental Status Exam:  Appearance:   Casual and Well Groomed     Behavior:  Appropriate and Sharing  Motor:  Normal  Speech/Language:   Normal Rate  Affect:  Appropriate, Congruent, Full Range and Tearful  Mood:  sad and empathetic  Thought process:  normal  Thought content:    WNL  Sensory/Perceptual disturbances:    WNL  Orientation:  x4  Attention:  Good  Concentration:  Good  Memory:  WNL  Fund of knowledge:   Good  Insight:    Good  Judgment:   Good  Impulse Control:  Good   Risk Assessment: Danger to Self:  No- No SI. No plan, no means, no intent.  Self-injurious Behavior: No Danger to Others: No Duty to Warn:no Physical Aggression / Violence:No  Access to Firearms a concern: No  Gang Involvement:No   Subjective: Client reported feeling more calm overall but recognizing feeling more sensitive to the part of her that feels abandoned/fearful of losing another person close to her. Therapist used MI with client to support her painful past and validate the part of her that runs from fears of being abandoned. Therapist used Mindfulness with client to help her in session begin to be in touch with her body sensations connected to her thoughts or feelings. Client made progress identifying the part of her, like a new parental figure, that protected her from further abandonment that first happened when her mom took her life. Therapist used IFS techniques with client in session to help her emotionally ground and connect to the fearful parts of her that hold old wounds. Client identified that feeling of feeling abandonment related to her trying to take her life a few months ago. Client was  tearful and processed the gratitude that she didn't take it and her dreams for finding full relief from those past memories that haunt her. Therapist assessed for safety andstability & client denied SI/HI/AVH and a desire to live.  Interventions: Mindfulness Meditation, Motivational Interviewing and Family Systems   Diagnosis:   ICD-10-CM   1. Generalized anxiety disorder  F41.1   2. PTSD (post-traumatic stress disorder)  F43.10    Plan of Care: Client to return for weekly therapy with Zoila Shutter, therapist, to review again in 6 months. Client is to being seeing medication provider for support of mood management for depression, passive SI, and insomnia.    Client to engage in CBT: challenging negative internal ruminationsand self-talk AEB journaling daily or expressing thoughts to support persons in their life and then challenging it with truth.  Client to engage in tapping to tap in healthy cognition that challenges negative rumination or deep negative core belief.  Client to practice DBT distress tolerance skills to decrease crying spells and thoughts of not being able to endure their suffering AEB using mindfulness, deep breathing, and TIP to increase tolerance for discomfort, discharge emotional distress, and increase their understanding that they can do hard things.  Client to utilize BSP (brainspotting) with therapist to help client identify and process triggers for her her depression, anxiety, and abandonment with goal of reducing said SUDs caused by depression/anxiety by 33% in the next 6 months. Client to prioritize sleep 8+ hours  each week night AEB going to bed by 10pm each night. Client participated in the treatment planning of their therapy. Client agreed with the plan and understands what to do if there is a crisis: call 9-1-1 and/or crisis line given by therapist.   Pauline Good, LCSW, LCAS, CCTP, CCS-I, BSP

## 2019-09-20 ENCOUNTER — Ambulatory Visit: Payer: BC Managed Care – PPO | Admitting: Addiction (Substance Use Disorder)

## 2019-09-26 ENCOUNTER — Ambulatory Visit (INDEPENDENT_AMBULATORY_CARE_PROVIDER_SITE_OTHER): Payer: BC Managed Care – PPO | Admitting: Addiction (Substance Use Disorder)

## 2019-09-26 ENCOUNTER — Other Ambulatory Visit: Payer: Self-pay

## 2019-09-26 ENCOUNTER — Encounter: Payer: Self-pay | Admitting: Addiction (Substance Use Disorder)

## 2019-09-26 DIAGNOSIS — F411 Generalized anxiety disorder: Secondary | ICD-10-CM | POA: Diagnosis not present

## 2019-09-26 NOTE — Progress Notes (Signed)
      [facility] Counselor/Therapist Progress Note  Patient ID: JANECE LAIDLAW, MRN: [id number],    Date: 09/26/2019  Time Spent:   Treatment Type: Individual Therapy  Reported Symptoms: sensitive, all feelings coming up.   Mental Status Exam:  Appearance:   Casual and Well Groomed     Behavior:  Appropriate and Sharing  Motor:  Normal  Speech/Language:   Normal Rate  Affect:  Appropriate and Congruent  Mood:  anxious and depressed  Thought process:  normal  Thought content:    WNL  Sensory/Perceptual disturbances:    WNL  Orientation:  x4  Attention:  Good  Concentration:  Good  Memory:  WNL  Fund of knowledge:   Good  Insight:    Good  Judgment:   Good  Impulse Control:  Good   Risk Assessment: Danger to Self:  No- No SI. No plan, no means, no intent.  Self-injurious Behavior: No Danger to Others: No Duty to Warn:no Physical Aggression / Violence:No  Access to Firearms a concern: No  Gang Involvement:No   Subjective: Client reported feeling gloomy today and like she wants to isolate herself. Client processed feeling very sensitive and having all her feelings coming up. Client explored some of the triggers coming from old family wounds coming up. Therapist used MI & CBT with client to support and validate her struggle today and help client be introspective about what may be causing her distress today. Therapist also used family systems with client to help client process the wounded relationships, their dynamics, and her feelings now about her family. Client made progress identifying her desire to be closer to her family and gratefulness for having a relationship, while also being glad to be home where shes not connected to any family drama. Therapist assessed for safety and stability and client denied SI/HI/AVH.  Interventions: Cognitive Behavioral Therapy, Motivational Interviewing and Family Systems   Diagnosis:   ICD-10-CM   1. Generalized anxiety disorder   F41.1    Plan of Care: Client to return for weekly therapy with Zoila Shutter, therapist, to review again in 6 months. Client is to being seeing medication provider for support of mood management for depression, passive SI, and insomnia.    Client to engage in CBT: challenging negative internal ruminationsand self-talk AEB journaling daily or expressing thoughts to support persons in their life and then challenging it with truth.  Client to engage in tapping to tap in healthy cognition that challenges negative rumination or deep negative core belief.  Client to practice DBT distress tolerance skills to decrease crying spells and thoughts of not being able to endure their suffering AEB using mindfulness, deep breathing, and TIP to increase tolerance for discomfort, discharge emotional distress, and increase their understanding that they can do hard things.  Client to utilize BSP (brainspotting) with therapist to help client identify and process triggers for her her depression, anxiety, and abandonment with goal of reducing said SUDs caused by depression/anxiety by 33% in the next 6 months. Client to prioritize sleep 8+ hours each week night AEB going to bed by 10pm each night. Client participated in the treatment planning of their therapy. Client agreed with the plan and understands what to do if there is a crisis: call 9-1-1 and/or crisis line given by therapist.   Pauline Good, LCSW, LCAS, CCTP, CCS-I, BSP

## 2019-10-04 ENCOUNTER — Encounter: Payer: Self-pay | Admitting: Adult Health

## 2019-10-04 ENCOUNTER — Other Ambulatory Visit: Payer: Self-pay

## 2019-10-04 ENCOUNTER — Ambulatory Visit (INDEPENDENT_AMBULATORY_CARE_PROVIDER_SITE_OTHER): Payer: BC Managed Care – PPO | Admitting: Adult Health

## 2019-10-04 DIAGNOSIS — G4700 Insomnia, unspecified: Secondary | ICD-10-CM

## 2019-10-04 DIAGNOSIS — F331 Major depressive disorder, recurrent, moderate: Secondary | ICD-10-CM | POA: Diagnosis not present

## 2019-10-04 DIAGNOSIS — F411 Generalized anxiety disorder: Secondary | ICD-10-CM | POA: Diagnosis not present

## 2019-10-04 DIAGNOSIS — F4310 Post-traumatic stress disorder, unspecified: Secondary | ICD-10-CM

## 2019-10-04 MED ORDER — TRAZODONE HCL 100 MG PO TABS: ORAL_TABLET | ORAL | 1 refills | Status: DC

## 2019-10-04 MED ORDER — ESCITALOPRAM OXALATE 5 MG PO TABS: 5.0000 mg | ORAL_TABLET | Freq: Every day | ORAL | 1 refills | Status: DC

## 2019-10-04 MED ORDER — CLONAZEPAM 1 MG PO TABS: ORAL_TABLET | ORAL | 2 refills | Status: DC

## 2019-10-04 NOTE — Progress Notes (Signed)
Monica [name] [id number] Sep 02, 1970 49 y.o.  Subjective:   Patient ID:  Monica [name] is a 48 y.o. (DOB September 27, 1970) female.  Chief Complaint: No chief complaint on file.   HPI BRYNNAN RODENBAUGH presents to the office today for follow-up of MDD, GAD, PTSD, insomnia, panic attacks, and ADHD.  Describes mood today as "ok". Pleasant. Mood symptoms - denies depression, anxiety, and irritability. Stating "things are going great". Feels like medications are working well. Feels better than she has in a "long time". Getting out more. Vacation to [place] - visiting family. Returns to work in August. Getting daughter prepared to return to college. Working with a Health and safety inspector. Careers adviser. Improved interest and motivation. Taking medications as prescribed.  Energy levels stable. Active, has a regular exercise routine. Walking dog.  Enjoys some usual interests and activities. Single. Lives alone. Has a 60 year old daughter at [facility].  Spending time with family and friends.  Appetite adequate. Weight stable.  Sleeps well most nights. Averages 8 hours. Focus and concentration has improved. Completing tasks. Managing aspects of household. 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  Denies SI or HI. Denies AH or VH.  Previous medication trials: Adderall, Trazadone, Clonazepam    Review of Systems:  Review of Systems  Musculoskeletal: Negative for gait problem.  Neurological: Negative for tremors.  Psychiatric/Behavioral:       Please refer to HPI    Medications: I have reviewed the patient's current medications.  Current Outpatient Medications  Medication Sig Dispense Refill  . albuterol (PROVENTIL HFA;VENTOLIN HFA) 108 (90 Base) MCG/ACT inhaler Inhale 2 puffs into the lungs every 6 (six) hours as needed for wheezing or shortness of breath.    . cetirizine (ZYRTEC) 10 MG tablet Take 10 mg by mouth daily.    . clonazePAM (KLONOPIN) 1 MG tablet Take one and 1/2  half tablets at bedtime for sleep. 45 tablet 2  . escitalopram (LEXAPRO) 5 MG tablet Take 1 tablet (5 mg total) by mouth daily. 90 tablet 1  . fluticasone (FLONASE) 50 MCG/ACT nasal spray Place 1 spray daily into both nostrils.    . Norethin Ace-Eth Estrad-FE 1-20 MG-MCG(24) CAPS Take 1 capsule by mouth daily.     . traZODone (DESYREL) 100 MG tablet Take one to two tablets at bedtime. 180 tablet 1  . valACYclovir (VALTREX) 500 MG tablet Take 500 mg by mouth 2 (two) times daily as needed (flare up).      No current facility-administered medications for this visit.    Medication Side Effects: None  Allergies:  Allergies  Allergen Reactions  . Sertraline Other (See Comments)    Felt on emotion  . Pepto-Bismol [Bismuth Subsalicylate] Rash  . Septra [Sulfamethoxazole-Trimethoprim] Rash    Past Medical History:  Diagnosis Date  . ADD (attention deficit disorder)   . Anxiety disorder   . Heart murmur   . Herpes simplex type 1 infection   . Insomnia    unspecified  . Migraines   . MRSA infection    left great toe  . PTSD (post-traumatic stress disorder)   . Seasonal allergies     Family History  Problem Relation Age of Onset  . Bipolar disorder Mother   . Lung cancer Father   . Migraines Neg Hx     Social History   Socioeconomic History  . Marital status: Divorced    Spouse name: Not on file  . Number of children: 1  . Years of education:  Not on file  . Highest education level: Bachelor's degree (e.g., BA, AB, BS)  Occupational History  . Not on file  Tobacco Use  . Smoking status: Former Smoker    Types: Cigarettes    Quit date: 07/30/2011    Years since quitting: 8.1  . Smokeless tobacco: Never Used  Vaping Use  . Vaping Use: Never used  Substance and Sexual Activity  . Alcohol use: Not Currently    Comment: occasional  . Drug use: No  . Sexual activity: Not on file  Other Topics Concern  . Not on file  Social History Narrative   Lives at home with fiance    Right handed   2 cups of caffeine daily   Social Determinants of Health   Financial Resource Strain:   . Difficulty of Paying Living Expenses:   Food Insecurity:   . Worried About Programme researcher, broadcasting/film/video in the Last Year:   . Barista in the Last Year:   Transportation Needs:   . Freight forwarder (Medical):   Marland Kitchen Lack of Transportation (Non-Medical):   Physical Activity:   . Days of Exercise per Week:   . Minutes of Exercise per Session:   Stress:   . Feeling of Stress :   Social Connections:   . Frequency of Communication with Friends and Family:   . Frequency of Social Gatherings with Friends and Family:   . Attends Religious Services:   . Active Member of Clubs or Organizations:   . Attends Banker Meetings:   Marland Kitchen Marital Status:   Intimate Partner Violence:   . Fear of Current or Ex-Partner:   . Emotionally Abused:   Marland Kitchen Physically Abused:   . Sexually Abused:     Past Medical History, Surgical history, Social history, and Family history were reviewed and updated as appropriate.   Please see review of systems for further details on the patient's review from today.   Objective:   Physical Exam:  There were no vitals taken for this visit.  Physical Exam Constitutional:      General: She is not in acute distress. Musculoskeletal:        General: No deformity.  Neurological:     Mental Status: She is alert and oriented to person, place, and time.     Coordination: Coordination normal.  Psychiatric:        Attention and Perception: Attention and perception normal. She does not perceive auditory or visual hallucinations.        Mood and Affect: Mood normal. Mood is not anxious or depressed. Affect is not labile, blunt, angry or inappropriate.        Speech: Speech normal.        Behavior: Behavior normal.        Thought Content: Thought content normal. Thought content is not paranoid or delusional. Thought content does not include homicidal or  suicidal ideation. Thought content does not include homicidal or suicidal plan.        Cognition and Memory: Cognition and memory normal.        Judgment: Judgment normal.     Comments: Insight intact     Lab Review:     Component Value Date/Time   NA 137 04/01/2019 2236   K 3.3 (L) 04/01/2019 2236   CL 101 04/01/2019 2236   CO2 26 04/01/2019 2236   GLUCOSE 108 (H) 04/01/2019 2236   BUN 7 04/01/2019 2236   CREATININE 0.84 04/01/2019 2236  CALCIUM 8.8 (L) 04/01/2019 2236   PROT 7.5 04/01/2019 2236   ALBUMIN 3.9 04/01/2019 2236   AST 20 04/01/2019 2236   ALT 14 04/01/2019 2236   ALKPHOS 36 (L) 04/01/2019 2236   BILITOT 0.4 04/01/2019 2236   GFRNONAA >60 04/01/2019 2236   GFRAA >60 04/01/2019 2236       Component Value Date/Time   WBC 8.5 04/01/2019 2236   RBC 4.21 04/01/2019 2236   HGB 12.4 04/01/2019 2236   HCT 37.9 04/01/2019 2236   PLT 340 04/01/2019 2236   MCV 90.0 04/01/2019 2236   MCH 29.5 04/01/2019 2236   MCHC 32.7 04/01/2019 2236   RDW 13.6 04/01/2019 2236   LYMPHSABS 1.4 07/23/2015 1038   MONOABS 0.4 07/23/2015 1038   EOSABS 0.0 07/23/2015 1038   BASOSABS 0.0 07/23/2015 1038    No results found for: POCLITH, LITHIUM   No results found for: PHENYTOIN, PHENOBARB, VALPROATE, CBMZ   .res Assessment: Plan:     Plan:  PDMP reviewed  1. Lexapro 5mg  daily  2. Clonazepam 1mg  to 1.5mg  at hs 3. Trazdone 100mg  - 2 at hs  RTC 6 months  Patient advised to contact office with any questions, adverse effects, or acute worsening in signs and symptoms.  Discussed potential benefits, risk, and side effects of benzodiazepines to include potential risk of tolerance and dependence, as well as possible drowsiness.  Advised patient not to drive if experiencing drowsiness and to take lowest possible effective dose to minimize risk of dependence and tolerance.   Diagnoses and all orders for this visit:  PTSD (post-traumatic stress disorder) -     escitalopram  (LEXAPRO) 5 MG tablet; Take 1 tablet (5 mg total) by mouth daily.  Generalized anxiety disorder -     escitalopram (LEXAPRO) 5 MG tablet; Take 1 tablet (5 mg total) by mouth daily. -     clonazePAM (KLONOPIN) 1 MG tablet; Take one and 1/2 half tablets at bedtime for sleep.  Major depressive disorder, recurrent episode, moderate (HCC) -     escitalopram (LEXAPRO) 5 MG tablet; Take 1 tablet (5 mg total) by mouth daily.  Insomnia, unspecified type -     traZODone (DESYREL) 100 MG tablet; Take one to two tablets at bedtime.     Please see After Visit Summary for patient specific instructions.  Future Appointments  Date Time Provider Department Center  10/11/2019 10:00 AM , LCSW [facility] None  10/21/2019 11:00 AM 12/11/2019, LCSW [facility] None  10/28/2019  8:00 AM 10/23/2019, LCSW [facility] None  04/05/2020  5:00 PM Liora Myles, 10/30/2019, NP [facility] None    No orders of the defined types were placed in this encounter.   -------------------------------

## 2019-10-11 ENCOUNTER — Ambulatory Visit: Payer: BC Managed Care – PPO | Admitting: Addiction (Substance Use Disorder)

## 2019-10-21 ENCOUNTER — Other Ambulatory Visit: Payer: Self-pay

## 2019-10-21 ENCOUNTER — Ambulatory Visit (INDEPENDENT_AMBULATORY_CARE_PROVIDER_SITE_OTHER): Payer: BC Managed Care – PPO | Admitting: Addiction (Substance Use Disorder)

## 2019-10-21 ENCOUNTER — Encounter: Payer: Self-pay | Admitting: Addiction (Substance Use Disorder)

## 2019-10-21 DIAGNOSIS — F4310 Post-traumatic stress disorder, unspecified: Secondary | ICD-10-CM

## 2019-10-21 NOTE — Progress Notes (Signed)
°      [facility] Counselor/Therapist Progress Note  Patient ID: GIOIA RANES, MRN: [id number],    Date: 10/21/2019  Time Spent:   Treatment Type: Individual Therapy  Reported Symptoms: frantic, confused, feelings coming up.   Mental Status Exam:  Appearance:   Casual and Well Groomed     Behavior:  Appropriate and Sharing  Motor:  Normal  Speech/Language:   Normal Rate  Affect:  Appropriate and Congruent  Mood:  anxious and depressed  Thought process:  normal  Thought content:    WNL  Sensory/Perceptual disturbances:    WNL  Orientation:  x4  Attention:  Good  Concentration:  Good  Memory:  WNL  Fund of knowledge:   Good  Insight:    Good  Judgment:   Good  Impulse Control:  Good   Risk Assessment: Danger to Self:  No- No SI. No plan, no means, no intent.  Self-injurious Behavior: No Danger to Others: No Duty to Warn:no Physical Aggression / Violence:No  Access to Firearms a concern: No  Gang Involvement:No   Subjective: Client reported feeling frantic, confused, and having all kinds of feelings coming up. Client struggling to believe that she was cared for by her caregivers and it makes her so sad and feeling like her family is so  "fucked up". Therapist used MI & mindfulness & Family Systems to support client in processing the pain involved with her family's skeletons, and to help client ground emotionally so as not to get triggered all over about her mom's death. Therapist taught client DBT skills again to help her emotionally regulate. Therapist used MI to give the client space to process and feel validated, helping client to believe her somatic memories of the trauma, helping her to continue grieving and not re-activating her nervous system. Therapist assessed for safety and stability and client denied SI/HI/AVH.  Interventions: Mindfulness Meditation, Motivational Interviewing and Family Systems   Diagnosis:   ICD-10-CM   1. PTSD (post-traumatic stress  disorder)  F43.10    Plan of Care: Client to return for weekly therapy with Zoila Shutter, therapist, to review again in 6 months. Client is to being seeing medication provider for support of mood management for depression, passive SI, and insomnia.    Client to engage in CBT: challenging negative internal ruminationsand self-talk AEB journaling daily or expressing thoughts to support persons in their life and then challenging it with truth.  Client to engage in tapping to tap in healthy cognition that challenges negative rumination or deep negative core belief.  Client to practice DBT distress tolerance skills to decrease crying spells and thoughts of not being able to endure their suffering AEB using mindfulness, deep breathing, and TIP to increase tolerance for discomfort, discharge emotional distress, and increase their understanding that they can do hard things.  Client to utilize BSP (brainspotting) with therapist to help client identify and process triggers for her her depression, anxiety, and abandonment with goal of reducing said SUDs caused by depression/anxiety by 33% in the next 6 months. Client to prioritize sleep 8+ hours each week night AEB going to bed by 10pm each night. Client participated in the treatment planning of their therapy. Client agreed with the plan and understands what to do if there is a crisis: call 9-1-1 and/or crisis line given by therapist.   Pauline Good, LCSW, LCAS, CCTP, CCS-I, BSP

## 2019-10-28 ENCOUNTER — Ambulatory Visit (INDEPENDENT_AMBULATORY_CARE_PROVIDER_SITE_OTHER): Payer: BC Managed Care – PPO | Admitting: Addiction (Substance Use Disorder)

## 2019-10-28 ENCOUNTER — Encounter: Payer: Self-pay | Admitting: Addiction (Substance Use Disorder)

## 2019-10-28 ENCOUNTER — Other Ambulatory Visit: Payer: Self-pay

## 2019-10-28 DIAGNOSIS — F4325 Adjustment disorder with mixed disturbance of emotions and conduct: Secondary | ICD-10-CM

## 2019-10-28 DIAGNOSIS — F4310 Post-traumatic stress disorder, unspecified: Secondary | ICD-10-CM | POA: Diagnosis not present

## 2019-10-28 NOTE — Progress Notes (Signed)
      [facility] Counselor/Therapist Progress Note  Patient ID: Monica [name], MRN: [id number],    Date: 10/28/2019  Time Spent:   Treatment Type: Individual Therapy  Reported Symptoms: grieving.   Mental Status Exam:  Appearance:   Casual and Well Groomed     Behavior:  Appropriate and Sharing  Motor:  Normal  Speech/Language:   Normal Rate  Affect:  Appropriate and Congruent  Mood:  sad  Thought process:  normal  Thought content:    WNL  Sensory/Perceptual disturbances:    WNL  Orientation:  x4  Attention:  Good  Concentration:  Good  Memory:  WNL  Fund of knowledge:   Good  Insight:    Good  Judgment:   Good  Impulse Control:  Good   Risk Assessment: Danger to Self:  No- No SI. No plan, no means, no intent.  Self-injurious Behavior: No Danger to Others: No Duty to Warn:no Physical Aggression / Violence:No  Access to Firearms a concern: No  Gang Involvement:No   Subjective: Client reported feeling so upset about her ex's exwife being with their old dog. Client reported feeling so heartbroken that someone else has time with her old dog Jet who protected her over the years. Client grieving the loss of Jet since her ex took him at their separation. Therapist used MI & Grief therapy to support the client as she processed the pain & sadness she has without Jet who was her protector. Therapist also used BSP with client to help client get in touch of the strength Jet gave her and help her ground emotionally/stabilize. Therapist assessed for safety and stability and client denied SI/HI/AVH.  Interventions: Motivational Interviewing, Grief Therapy and Brainspotting   Diagnosis:   ICD-10-CM   1. PTSD (post-traumatic stress disorder)  F43.10   2. Adjustment disorder with mixed disturbance of emotions and conduct  F43.25      Plan of Care: Client to return for weekly therapy with Zoila Shutter, therapist, to review again in 6 months. Client is to being seeing  medication provider for support of mood management for depression, passive SI, and insomnia.    Client to engage in CBT: challenging negative internal ruminationsand self-talk AEB journaling daily or expressing thoughts to support persons in their life and then challenging it with truth.  Client to engage in tapping to tap in healthy cognition that challenges negative rumination or deep negative core belief.  Client to practice DBT distress tolerance skills to decrease crying spells and thoughts of not being able to endure their suffering AEB using mindfulness, deep breathing, and TIP to increase tolerance for discomfort, discharge emotional distress, and increase their understanding that they can do hard things.  Client to utilize BSP (brainspotting) with therapist to help client identify and process triggers for her her depression, anxiety, and abandonment with goal of reducing said SUDs caused by depression/anxiety by 33% in the next 6 months. Client to prioritize sleep 8+ hours each week night AEB going to bed by 10pm each night. Client participated in the treatment planning of their therapy. Client agreed with the plan and understands what to do if there is a crisis: call 9-1-1 and/or crisis line given by therapist.   Pauline Good, LCSW, LCAS, CCTP, CCS-I, BSP

## 2019-12-01 ENCOUNTER — Ambulatory Visit: Payer: BC Managed Care – PPO | Admitting: Addiction (Substance Use Disorder)

## 2019-12-14 ENCOUNTER — Ambulatory Visit: Payer: BC Managed Care – PPO | Admitting: Addiction (Substance Use Disorder)

## 2020-01-02 ENCOUNTER — Ambulatory Visit: Payer: BC Managed Care – PPO | Admitting: Addiction (Substance Use Disorder)

## 2020-01-19 ENCOUNTER — Ambulatory Visit (INDEPENDENT_AMBULATORY_CARE_PROVIDER_SITE_OTHER): Payer: BC Managed Care – PPO | Admitting: Addiction (Substance Use Disorder)

## 2020-01-19 ENCOUNTER — Other Ambulatory Visit: Payer: Self-pay

## 2020-01-19 ENCOUNTER — Encounter: Payer: Self-pay | Admitting: Addiction (Substance Use Disorder)

## 2020-01-19 DIAGNOSIS — F4310 Post-traumatic stress disorder, unspecified: Secondary | ICD-10-CM

## 2020-01-19 NOTE — Progress Notes (Signed)
      [facility] Counselor/Therapist Progress Note  Patient ID: Monica [name], MRN: [id number],    Date: 01/19/2020  Time Spent:  Treatment Type: Individual Therapy  Reported Symptoms: in pain.    Mental Status Exam:  Appearance:   Casual and Well Groomed     Behavior:  Appropriate and Sharing  Motor:  Normal  Speech/Language:   Normal Rate  Affect:  Appropriate and Congruent  Mood:  normal  Thought process:  normal  Thought content:    WNL  Sensory/Perceptual disturbances:    WNL  Orientation:  x4  Attention:  [name]  Concentration:  [name]  Memory:  WNL  Fund of knowledge:   [name]  Insight:    [name]  Judgment:   [name]  Impulse Control:  [name]   Risk Assessment: Danger to Self:  No- No SI. No plan, no means, no intent.  Self-injurious Behavior: No Danger to Others: No Duty to Warn:no Physical Aggression / Violence:No  Access to Firearms a concern: No  Gang Involvement:No   Subjective: Client reported being in pain after throwing her back out. Client in pain but has a lot of help with her new bf's Monica [name]'s help. Client thankful for having a [name] support person in her life. Client processed having to endure her daughter's anxiety and self-sabotage drama. Therapist used MI & CBT to support and validate her frustration and trouble helping her daughter while helping client to notice thoughts that make her feel anxious about the situation, to help her alleviate stress. Client processed running into her ex in public and the fear it brought up, along with the way she worked to calm back down. Therapist assessed for safety and stability and client denied SI/HI/AVH.  Interventions: Cognitive Behavioral Therapy and Motivational Interviewing   Diagnosis:   ICD-10-CM   1. PTSD (post-traumatic stress disorder)  F43.10     Plan of Care: Client to return for weekly therapy with Monica [name], therapist, to review again in 6 months. Client is to being seeing medication provider for  support of mood management for depression, passive SI, and insomnia.    Client to engage in CBT: challenging negative internal ruminationsand self-talk AEB journaling daily or expressing thoughts to support persons in their life and then challenging it with truth.  Client to engage in tapping to tap in healthy cognition that challenges negative rumination or deep negative core belief.  Client to practice DBT distress tolerance skills to decrease crying spells and thoughts of not being able to endure their suffering AEB using mindfulness, deep breathing, and TIP to increase tolerance for discomfort, discharge emotional distress, and increase their understanding that they can do hard things.  Client to utilize BSP (brainspotting) with therapist to help client identify and process triggers for her her depression, anxiety, and abandonment with goal of reducing said SUDs caused by depression/anxiety by 33% in the next 6 months. Client to prioritize sleep 8+ hours each week night AEB going to bed by 10pm each night. Client participated in the treatment planning of their therapy. Client agreed with the plan and understands what to do if there is a crisis: call 9-1-1 and/or crisis line given by therapist.   Monica Good, LCSW, LCAS, CCTP, CCS-I, BSP

## 2020-02-21 ENCOUNTER — Other Ambulatory Visit: Payer: Self-pay | Admitting: Adult Health

## 2020-02-21 DIAGNOSIS — F411 Generalized anxiety disorder: Secondary | ICD-10-CM

## 2020-03-01 ENCOUNTER — Other Ambulatory Visit: Payer: BC Managed Care – PPO

## 2020-03-01 DIAGNOSIS — Z20822 Contact with and (suspected) exposure to covid-19: Secondary | ICD-10-CM

## 2020-03-03 LAB — SARS-COV-2, NAA 2 DAY TAT

## 2020-03-03 LAB — NOVEL CORONAVIRUS, NAA: SARS-CoV-2, NAA: NOT DETECTED

## 2020-03-08 ENCOUNTER — Ambulatory Visit: Payer: BC Managed Care – PPO | Admitting: Addiction (Substance Use Disorder)

## 2020-04-05 ENCOUNTER — Encounter: Payer: Self-pay | Admitting: Adult Health

## 2020-04-05 ENCOUNTER — Ambulatory Visit (INDEPENDENT_AMBULATORY_CARE_PROVIDER_SITE_OTHER): Payer: BC Managed Care – PPO | Admitting: Adult Health

## 2020-04-05 ENCOUNTER — Other Ambulatory Visit: Payer: Self-pay

## 2020-04-05 DIAGNOSIS — F4310 Post-traumatic stress disorder, unspecified: Secondary | ICD-10-CM | POA: Diagnosis not present

## 2020-04-05 DIAGNOSIS — F331 Major depressive disorder, recurrent, moderate: Secondary | ICD-10-CM

## 2020-04-05 DIAGNOSIS — F411 Generalized anxiety disorder: Secondary | ICD-10-CM | POA: Diagnosis not present

## 2020-04-05 DIAGNOSIS — G4700 Insomnia, unspecified: Secondary | ICD-10-CM | POA: Diagnosis not present

## 2020-04-05 MED ORDER — CLONAZEPAM 1 MG PO TABS: ORAL_TABLET | ORAL | 2 refills | Status: DC

## 2020-04-05 MED ORDER — TRAZODONE HCL 100 MG PO TABS: ORAL_TABLET | ORAL | 3 refills | Status: DC

## 2020-04-05 MED ORDER — ESCITALOPRAM OXALATE 5 MG PO TABS: 5.0000 mg | ORAL_TABLET | Freq: Every day | ORAL | 3 refills | Status: DC

## 2020-04-05 NOTE — Progress Notes (Signed)
Monica [name] [id number] 1970-06-09 50 y.o.  Subjective:   Patient ID:  Monica [name] is a 50 y.o. (DOB 07/27/1970) female.  Chief Complaint: No chief complaint on file.   HPI Monica [name] presents to the office today for follow-up of MDD, GAD, PTSD, insomnia, panic attacks, and ADHD.  Describes mood today as "ok". Pleasant. Mood symptoms - denies depression, anxiety, and irritability. Stating "I'm doing good". Feels like medications continue to work well. Seeing therapist. Improved interest and motivation. Taking medications as prescribed.  Energy levels stable. Active, has a regular exercise routine. Walking dog.  Enjoys some usual interests and activities. Single. Lives alone. Has a 93 year old daughter at [facility].  Spending time with family and friends.  Appetite adequate. Weight stable. History of binge eating - not lately. Sleeps well most nights. Averages 8 hours. Focus and concentration has improved. Completing tasks. Managing aspects of household. 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  Denies SI or HI.  Denies AH or VH.  Previous medication trials: Adderall, Trazadone, Clonazepam    Review of Systems:  Review of Systems  Musculoskeletal: Negative for gait problem.  Neurological: Negative for tremors.  Psychiatric/Behavioral:       Please refer to HPI    Medications: I have reviewed the patient's current medications.  Current Outpatient Medications  Medication Sig Dispense Refill  . albuterol (PROVENTIL HFA;VENTOLIN HFA) 108 (90 Base) MCG/ACT inhaler Inhale 2 puffs into the lungs every 6 (six) hours as needed for wheezing or shortness of breath.    . cetirizine (ZYRTEC) 10 MG tablet Take 10 mg by mouth daily.    . clonazePAM (KLONOPIN) 1 MG tablet TAKE 1 & 1/2 TABLETS BY MOUTH AT BEDTIME FOR SLEEP 45 tablet 2  . escitalopram (LEXAPRO) 5 MG tablet Take 1 tablet (5 mg total) by mouth daily. 90 tablet 3  . fluticasone (FLONASE) 50  MCG/ACT nasal spray Place 1 spray daily into both nostrils.    . Norethin Ace-Eth Estrad-FE 1-20 MG-MCG(24) CAPS Take 1 capsule by mouth daily.     . traZODone (DESYREL) 100 MG tablet Take one to two tablets at bedtime. 180 tablet 3  . valACYclovir (VALTREX) 500 MG tablet Take 500 mg by mouth 2 (two) times daily as needed (flare up).      No current facility-administered medications for this visit.    Medication Side Effects: None  Allergies:  Allergies  Allergen Reactions  . Other     Other reaction(s): Unknown  . Pristiq [Desvenlafaxine] Other (See Comments)  . Sertraline Other (See Comments)    Felt on emotion  . Sertraline Hcl Other (See Comments)  . Pepto-Bismol [Bismuth Subsalicylate] Rash  . Sulfamethoxazole-Trimethoprim Rash    Other reaction(s): rash    Past Medical History:  Diagnosis Date  . ADD (attention deficit disorder)   . Anxiety disorder   . Heart murmur   . Herpes simplex type 1 infection   . Insomnia    unspecified  . Migraines   . MRSA infection    left great toe  . PTSD (post-traumatic stress disorder)   . Seasonal allergies     Family History  Problem Relation Age of Onset  . Bipolar disorder Mother   . Lung cancer Father   . Migraines Neg Hx     Social History   Socioeconomic History  . Marital status: Divorced    Spouse name: Not on file  . Number of children: 1  .  Years of education: Not on file  . Highest education level: Bachelor's degree (e.g., BA, AB, BS)  Occupational History  . Not on file  Tobacco Use  . Smoking status: Former Smoker    Types: Cigarettes    Quit date: 07/30/2011    Years since quitting: 8.6  . Smokeless tobacco: Never Used  Vaping Use  . Vaping Use: Never used  Substance and Sexual Activity  . Alcohol use: Not Currently    Comment: occasional  . Drug use: No  . Sexual activity: Not on file  Other Topics Concern  . Not on file  Social History Narrative   Lives at home with fiance   Right handed    2 cups of caffeine daily   Social Determinants of Health   Financial Resource Strain: Not on file  Food Insecurity: Not on file  Transportation Needs: Not on file  Physical Activity: Not on file  Stress: Not on file  Social Connections: Not on file  Intimate Partner Violence: Not on file    Past Medical History, Surgical history, Social history, and Family history were reviewed and updated as appropriate.   Please see review of systems for further details on the patient's review from today.   Objective:   Physical Exam:  There were no vitals taken for this visit.  Physical Exam Constitutional:      General: She is not in acute distress. Musculoskeletal:        General: No deformity.  Neurological:     Mental Status: She is alert and oriented to person, place, and time.     Coordination: Coordination normal.  Psychiatric:        Attention and Perception: Attention and perception normal. She does not perceive auditory or visual hallucinations.        Mood and Affect: Mood normal. Mood is not anxious or depressed. Affect is not labile, blunt, angry or inappropriate.        Speech: Speech normal.        Behavior: Behavior normal.        Thought Content: Thought content normal. Thought content is not paranoid or delusional. Thought content does not include homicidal or suicidal ideation. Thought content does not include homicidal or suicidal plan.        Cognition and Memory: Cognition and memory normal.        Judgment: Judgment normal.     Comments: Insight intact     Lab Review:     Component Value Date/Time   NA 137 04/01/2019 2236   K 3.3 (L) 04/01/2019 2236   CL 101 04/01/2019 2236   CO2 26 04/01/2019 2236   GLUCOSE 108 (H) 04/01/2019 2236   BUN 7 04/01/2019 2236   CREATININE 0.84 04/01/2019 2236   CALCIUM 8.8 (L) 04/01/2019 2236   PROT 7.5 04/01/2019 2236   ALBUMIN 3.9 04/01/2019 2236   AST 20 04/01/2019 2236   ALT 14 04/01/2019 2236   ALKPHOS 36 (L)  04/01/2019 2236   BILITOT 0.4 04/01/2019 2236   GFRNONAA >60 04/01/2019 2236   GFRAA >60 04/01/2019 2236       Component Value Date/Time   WBC 8.5 04/01/2019 2236   RBC 4.21 04/01/2019 2236   HGB 12.4 04/01/2019 2236   HCT 37.9 04/01/2019 2236   PLT 340 04/01/2019 2236   MCV 90.0 04/01/2019 2236   MCH 29.5 04/01/2019 2236   MCHC 32.7 04/01/2019 2236   RDW 13.6 04/01/2019 2236   LYMPHSABS 1.4 07/23/2015  1038   MONOABS 0.4 07/23/2015 1038   EOSABS 0.0 07/23/2015 1038   BASOSABS 0.0 07/23/2015 1038    No results found for: POCLITH, LITHIUM   No results found for: PHENYTOIN, PHENOBARB, VALPROATE, CBMZ   .res Assessment: Plan:    Plan:  PDMP reviewed  1. Lexapro 5mg  daily  2. Clonazepam 1mg  to 1.5mg  at hs 3. Trazdone 100mg  - 2 at hs  RTC 6 months  Patient advised to contact office with any questions, adverse effects, or acute worsening in signs and symptoms.  Discussed potential benefits, risk, and side effects of benzodiazepines to include potential risk of tolerance and dependence, as well as possible drowsiness.  Advised patient not to drive if experiencing drowsiness and to take lowest possible effective dose to minimize risk of dependence and tolerance.    Diagnoses and all orders for this visit:  PTSD (post-traumatic stress disorder) -     escitalopram (LEXAPRO) 5 MG tablet; Take 1 tablet (5 mg total) by mouth daily.  Generalized anxiety disorder -     escitalopram (LEXAPRO) 5 MG tablet; Take 1 tablet (5 mg total) by mouth daily. -     clonazePAM (KLONOPIN) 1 MG tablet; TAKE 1 & 1/2 TABLETS BY MOUTH AT BEDTIME FOR SLEEP  Major depressive disorder, recurrent episode, moderate (HCC) -     escitalopram (LEXAPRO) 5 MG tablet; Take 1 tablet (5 mg total) by mouth daily.  Insomnia, unspecified type -     traZODone (DESYREL) 100 MG tablet; Take one to two tablets at bedtime.     Please see After Visit Summary for patient specific instructions.  No future  appointments.  No orders of the defined types were placed in this encounter.   -------------------------------

## 2020-04-19 ENCOUNTER — Telehealth: Payer: Self-pay | Admitting: Adult Health

## 2020-04-19 ENCOUNTER — Other Ambulatory Visit: Payer: Self-pay | Admitting: Adult Health

## 2020-04-19 DIAGNOSIS — F331 Major depressive disorder, recurrent, moderate: Secondary | ICD-10-CM

## 2020-04-19 DIAGNOSIS — F4310 Post-traumatic stress disorder, unspecified: Secondary | ICD-10-CM

## 2020-04-19 DIAGNOSIS — F411 Generalized anxiety disorder: Secondary | ICD-10-CM

## 2020-04-19 MED ORDER — ESCITALOPRAM OXALATE 10 MG PO TABS: 5.0000 mg | ORAL_TABLET | Freq: Every day | ORAL | 5 refills | Status: DC

## 2020-04-19 NOTE — Telephone Encounter (Signed)
Noted  

## 2020-04-19 NOTE — Telephone Encounter (Signed)
Pt called to report she needs to talk about a med change. Apt was 1/27. Was to follow up in 6 mos. Contact @ [phone]

## 2020-04-19 NOTE — Telephone Encounter (Signed)
She says at her last visit you told her she can increase Lexapro to 10mg  a day.She decided to increase it and is feeling much better.She just wanted to call and let you know and was wondering if her next prescription will be changed since she is increasing.

## 2020-04-19 NOTE — Telephone Encounter (Signed)
Can we get more details?

## 2020-04-30 ENCOUNTER — Ambulatory Visit (INDEPENDENT_AMBULATORY_CARE_PROVIDER_SITE_OTHER): Payer: BC Managed Care – PPO | Admitting: Addiction (Substance Use Disorder)

## 2020-04-30 ENCOUNTER — Encounter: Payer: Self-pay | Admitting: Addiction (Substance Use Disorder)

## 2020-04-30 ENCOUNTER — Telehealth: Payer: Self-pay | Admitting: Adult Health

## 2020-04-30 ENCOUNTER — Other Ambulatory Visit: Payer: Self-pay

## 2020-04-30 DIAGNOSIS — F411 Generalized anxiety disorder: Secondary | ICD-10-CM | POA: Diagnosis not present

## 2020-04-30 DIAGNOSIS — F4310 Post-traumatic stress disorder, unspecified: Secondary | ICD-10-CM

## 2020-04-30 NOTE — Telephone Encounter (Signed)
Called and spoke with patient.

## 2020-04-30 NOTE — Progress Notes (Signed)
      [facility] Counselor/Therapist Progress Note  Patient ID: Monica [name], MRN: [id number],    Date: 04/30/2020  Time Spent:  Treatment Type: Individual Therapy  Reported Symptoms: dysregulated.   Mental Status Exam:  Appearance:   Casual and Well Groomed     Behavior:  Appropriate and Sharing  Motor:  Tremor  Speech/Language:   Normal Rate  Affect:  Appropriate and Congruent  Mood:  anxious, irritable, labile and sad  Thought process:  normal  Thought content:    Rumination  Sensory/Perceptual disturbances:    Flashback  Orientation:  x4  Attention:  Good  Concentration:  Fair  Memory:  WNL  Fund of knowledge:   Good  Insight:    Poor  Judgment:   Fair  Impulse Control:  Good   Risk Assessment: Danger to Self:  No- No SI. No plan, no means, no intent.  Self-injurious Behavior: No Danger to Others: No Duty to Warn:no Physical Aggression / Violence:No  Access to Firearms a concern: No  Gang Involvement:No   Subjective: Client reported complete dysregulation and panic that has increased drastically in the last few weeks. Client reporting having nightmares, flashbacks, ongoing panic, frantic out of control thoughts etc. Client's PTSD is flaring up so she asked about a medication that could help regulate her hypervigilence further. Therapist used MI & med management to help client process the triggers along with creating a plan for how to help her re-stablize.  Therapist assessed for safety and stability and client denied SI/HI/AVH.  Interventions: Motivational Interviewing   Diagnosis:   ICD-10-CM   1. PTSD (post-traumatic stress disorder)  F43.10   2. Generalized anxiety disorder  F41.1     Plan of Care: Client to return for weekly therapy with Zoila Shutter, therapist, to review again in 6 months. Client is to being seeing medication provider for support of mood management for depression, passive SI, and insomnia.    Client to engage in CBT: challenging  negative internal ruminationsand self-talk AEB journaling daily or expressing thoughts to support persons in their life and then challenging it with truth.  Client to engage in tapping to tap in healthy cognition that challenges negative rumination or deep negative core belief.  Client to practice DBT distress tolerance skills to decrease crying spells and thoughts of not being able to endure their suffering AEB using mindfulness, deep breathing, and TIP to increase tolerance for discomfort, discharge emotional distress, and increase their understanding that they can do hard things.  Client to utilize BSP (brainspotting) with therapist to help client identify and process triggers for her her depression, anxiety, and abandonment with goal of reducing said SUDs caused by depression/anxiety by 33% in the next 6 months. Client to prioritize sleep 8+ hours each week night AEB going to bed by 10pm each night. Client participated in the treatment planning of their therapy. Client agreed with the plan and understands what to do if there is a crisis: call 9-1-1 and/or crisis line given by therapist.   Pauline Good, LCSW, LCAS, CCTP, CCS-I, BSP

## 2020-05-15 ENCOUNTER — Ambulatory Visit: Payer: BC Managed Care – PPO | Admitting: Addiction (Substance Use Disorder)

## 2020-06-14 ENCOUNTER — Other Ambulatory Visit: Payer: Self-pay | Admitting: Adult Health

## 2020-06-14 DIAGNOSIS — F331 Major depressive disorder, recurrent, moderate: Secondary | ICD-10-CM

## 2020-06-14 DIAGNOSIS — F4310 Post-traumatic stress disorder, unspecified: Secondary | ICD-10-CM

## 2020-06-14 DIAGNOSIS — F411 Generalized anxiety disorder: Secondary | ICD-10-CM

## 2020-06-21 ENCOUNTER — Other Ambulatory Visit (HOSPITAL_BASED_OUTPATIENT_CLINIC_OR_DEPARTMENT_OTHER): Payer: Self-pay | Admitting: Family Medicine

## 2020-06-21 DIAGNOSIS — K8050 Calculus of bile duct without cholangitis or cholecystitis without obstruction: Secondary | ICD-10-CM

## 2020-06-22 ENCOUNTER — Other Ambulatory Visit: Payer: Self-pay

## 2020-06-22 ENCOUNTER — Ambulatory Visit (HOSPITAL_BASED_OUTPATIENT_CLINIC_OR_DEPARTMENT_OTHER)
Admission: RE | Admit: 2020-06-22 | Discharge: 2020-06-22 | Disposition: A | Discharge status: Home / Self Care | Payer: BC Managed Care – PPO | Source: Ambulatory Visit | Attending: Family Medicine | Admitting: Family Medicine

## 2020-06-22 DIAGNOSIS — K8050 Calculus of bile duct without cholangitis or cholecystitis without obstruction: Secondary | ICD-10-CM | POA: Insufficient documentation

## 2020-06-25 ENCOUNTER — Ambulatory Visit (INDEPENDENT_AMBULATORY_CARE_PROVIDER_SITE_OTHER): Payer: BC Managed Care – PPO | Admitting: Addiction (Substance Use Disorder)

## 2020-06-25 ENCOUNTER — Other Ambulatory Visit: Payer: Self-pay

## 2020-06-25 DIAGNOSIS — F411 Generalized anxiety disorder: Secondary | ICD-10-CM | POA: Diagnosis not present

## 2020-06-25 NOTE — Progress Notes (Signed)
      [facility] Counselor/Therapist Progress Note  Patient ID: Monica [name], MRN: [id number],    Date: 06/25/2020  Time Spent:  Treatment Type: Individual Therapy  Reported Symptoms: motivated to take care of herself  Mental Status Exam:  Appearance:   Casual and Well Groomed     Behavior:  Appropriate and Sharing  Motor:  Normal  Speech/Language:   Normal Rate  Affect:  Appropriate and Congruent  Mood:  normal  Thought process:  normal  Thought content:    Rumination  Sensory/Perceptual disturbances:    Flashback  Orientation:  x4  Attention:  Good  Concentration:  Fair  Memory:  WNL  Fund of knowledge:   Good  Insight:    Good  Judgment:   Fair  Impulse Control:  Good   Risk Assessment: Danger to Self:  No- No SI. No plan, no means, no intent.  Self-injurious Behavior: No Danger to Others: No Duty to Warn:no Physical Aggression / Violence:No  Access to Firearms a concern: No  Gang Involvement:No    Subjective: Client reported trying to take care of herself more, esp her health. Client making progress with her weight and going to a weight loss office called Delrae Rend MD. Client also possibly needing a gallbladder removal and trying to plan well. Client reported an argument with her bf where she expressed her feelings/needs and he dismissed them. Client concerned she was getting into a similar rut and client is trying to emotionally regulate. Therapist used MI & DBT with client to support her and encourage emotional regulation and grounding. Therapist assessed for safety and stability and client denied SI/HI/AVH.  Interventions: Dialectical Behavioral Therapy and Motivational Interviewing   Diagnosis:   ICD-10-CM   1. Generalized anxiety disorder  F41.1      Plan of Care: Client to return for weekly therapy with Zoila Shutter, therapist, to review again in 6 months. Client is to being seeing medication provider for support of mood management for depression,  passive SI, and insomnia.    Client to engage in CBT: challenging negative internal ruminationsand self-talk AEB journaling daily or expressing thoughts to support persons in their life and then challenging it with truth.  Client to engage in tapping to tap in healthy cognition that challenges negative rumination or deep negative core belief.  Client to practice DBT distress tolerance skills to decrease crying spells and thoughts of not being able to endure their suffering AEB using mindfulness, deep breathing, and TIP to increase tolerance for discomfort, discharge emotional distress, and increase their understanding that they can do hard things.  Client to utilize BSP (brainspotting) with therapist to help client identify and process triggers for her her depression, anxiety, and abandonment with goal of reducing said SUDs caused by depression/anxiety by 33% in the next 6 months. Client to prioritize sleep 8+ hours each week night AEB going to bed by 10pm each night. Client participated in the treatment planning of their therapy. Client agreed with the plan and understands what to do if there is a crisis: call 9-1-1 and/or crisis line given by therapist.   Pauline Good, LCSW, LCAS, CCTP, CCS-I, BSP

## 2020-07-18 ENCOUNTER — Other Ambulatory Visit: Payer: Self-pay

## 2020-07-18 ENCOUNTER — Ambulatory Visit (INDEPENDENT_AMBULATORY_CARE_PROVIDER_SITE_OTHER): Payer: BC Managed Care – PPO | Admitting: Addiction (Substance Use Disorder)

## 2020-07-18 DIAGNOSIS — F411 Generalized anxiety disorder: Secondary | ICD-10-CM | POA: Diagnosis not present

## 2020-07-18 NOTE — Progress Notes (Signed)
      [facility] Counselor/Therapist Progress Note  Patient ID: Monica [name], MRN: [id number],    Date: 07/18/2020  Time Spent:  Treatment Type: Individual Therapy  Reported Symptoms: scared.  Mental Status Exam:  Appearance:   Casual and Well Groomed     Behavior:  Appropriate and Sharing  Motor:  Normal  Speech/Language:   Normal Rate  Affect:  Appropriate and Congruent  Mood:  anxious  Thought process:  normal  Thought content:    Rumination  Sensory/Perceptual disturbances:    Flashback  Orientation:  x4  Attention:  Good  Concentration:  Fair  Memory:  WNL  Fund of knowledge:   Good  Insight:    Good  Judgment:   Fair  Impulse Control:  Good   Risk Assessment: Danger to Self:  No- No SI. No plan, no means, no intent.  Self-injurious Behavior: No Danger to Others: No Duty to Warn:no Physical Aggression / Violence:No  Access to Firearms a concern: No  Gang Involvement:No    Subjective: Client invited her daughter in for part of the session to discuss her anxiety of losing a relationship with her daughter, following a family conflict after client saw her ex and her ex step son on mothers day. Clients daughter was threatened and flooded with old toxic memories of their fights and fear that client's ex would come back in their life and make them all unsafe again. Client processed her lack of good judgement call and apologized to her daughter, seeking to reconcile. Client and daughter made progress in session validating one another's feelings and calming together. Therapist also used MI to support client in exploring her thoughts and feelings. Therapist assessed for safety and stability and client denied SI/HI/AVH and reported no trigger from seeing her ex.   Interventions: Motivational Interviewing and Family Systems   Diagnosis:   ICD-10-CM   1. Generalized anxiety disorder  F41.1     Plan of Care: Client to return for weekly therapy with Zoila Shutter,  therapist, to review again in 6 months. Client is to being seeing medication provider for support of mood management for depression, passive SI, and insomnia.    Client to engage in CBT: challenging negative internal ruminationsand self-talk AEB journaling daily or expressing thoughts to support persons in their life and then challenging it with truth.  Client to engage in tapping to tap in healthy cognition that challenges negative rumination or deep negative core belief.  Client to practice DBT distress tolerance skills to decrease crying spells and thoughts of not being able to endure their suffering AEB using mindfulness, deep breathing, and TIP to increase tolerance for discomfort, discharge emotional distress, and increase their understanding that they can do hard things.  Client to utilize BSP (brainspotting) with therapist to help client identify and process triggers for her her depression, anxiety, and abandonment with goal of reducing said SUDs caused by depression/anxiety by 33% in the next 6 months. Client to prioritize sleep 8+ hours each week night AEB going to bed by 10pm each night. Client participated in the treatment planning of their therapy. Client agreed with the plan and understands what to do if there is a crisis: call 9-1-1 and/or crisis line given by therapist.   Pauline Good, LCSW, LCAS, CCTP, CCS-I, BSP

## 2020-07-31 ENCOUNTER — Other Ambulatory Visit: Payer: Self-pay | Admitting: Surgery

## 2020-08-17 ENCOUNTER — Ambulatory Visit (INDEPENDENT_AMBULATORY_CARE_PROVIDER_SITE_OTHER): Payer: BC Managed Care – PPO | Admitting: Addiction (Substance Use Disorder)

## 2020-08-17 ENCOUNTER — Other Ambulatory Visit: Payer: Self-pay

## 2020-08-17 DIAGNOSIS — F411 Generalized anxiety disorder: Secondary | ICD-10-CM

## 2020-08-17 NOTE — Progress Notes (Signed)
      [facility] Counselor/Therapist Progress Note  Patient ID: Monica [name], MRN: [id number],    Date: 08/17/2020  Time Spent:  Treatment Type: Individual Therapy  Reported Symptoms: uneasy but hopeful  Mental Status Exam:  Appearance:   Casual and Well Groomed     Behavior:  Appropriate and Sharing  Motor:  Normal  Speech/Language:   Normal Rate  Affect:  Appropriate and Congruent  Mood:  anxious  Thought process:  normal  Thought content:    Rumination  Sensory/Perceptual disturbances:    Flashback  Orientation:  x4  Attention:  Good  Concentration:  Fair  Memory:  WNL  Fund of knowledge:   Good  Insight:    Good  Judgment:   Fair  Impulse Control:  Good   Risk Assessment: Danger to Self:  No- No SI. No plan, no means, no intent.  Self-injurious Behavior: No Danger to Others: No Duty to Warn:no Physical Aggression / Violence:No  Access to Firearms a concern: No  Gang Involvement:No    Subjective: Client processed having more thoughts about returning to her ex, who left when she had a mental break after EMDR opened up past childhood trauma. Client reported some uneasy feelings but also hopefulness of reconciliation with [name] with whom she has so much history with. Therapist used MI & CBT with client to validate her concerns, and support her in processing what expectations she has for a relationship with him to protect herself from getting hurt. Therapist also discussed rationally with client about her thoughts about him leaving and if she could risk him leaving again if he has not expressed his faithfulness/willingness to not leave again. Client agreed to being afraid of his faithfulness and reported a desire for him to go to couples therapy. Therapist assessed for safety and stability and client denied SI/HI/AVH.  Interventions: Cognitive Behavioral Therapy and Motivational Interviewing   Diagnosis:   ICD-10-CM   1. Generalized anxiety disorder  F41.1         Plan of Care: Client to return for weekly therapy with Zoila Shutter, therapist, to review again in 6 months.  Client is to being seeing medication provider for support of mood management for depression, passive SI, and insomnia.    Client to engage in CBT: challenging negative internal ruminations and self-talk AEB journaling daily or expressing thoughts to support persons in their life and then challenging it with truth.  Client to engage in tapping to tap in healthy cognition that challenges negative rumination or deep negative core belief.  Client to practice DBT distress tolerance skills to decrease crying spells and thoughts of not being able to endure their suffering AEB using mindfulness, deep breathing, and TIP to increase tolerance for discomfort, discharge emotional distress, and increase their understanding that they can do hard things.  Client to utilize BSP (brainspotting) with therapist to help client identify and process triggers for her her depression, anxiety, and abandonment with goal of reducing said SUDs caused by depression/anxiety by 33% in the next 6 months.  Client to prioritize sleep 8+ hours each week night AEB going to bed by 10pm each night.  Client participated in the treatment planning of their therapy. Client agreed with the plan and understands what to do if there is a crisis: call 9-1-1 and/or crisis line given by therapist.   Pauline Good, LCSW, LCAS, CCTP, CCS-I, BSP

## 2020-08-23 ENCOUNTER — Ambulatory Visit: Payer: BC Managed Care – PPO | Admitting: Addiction (Substance Use Disorder)

## 2020-08-29 ENCOUNTER — Encounter (HOSPITAL_BASED_OUTPATIENT_CLINIC_OR_DEPARTMENT_OTHER): Payer: Self-pay | Admitting: Surgery

## 2020-08-30 ENCOUNTER — Other Ambulatory Visit: Payer: Self-pay

## 2020-08-30 ENCOUNTER — Encounter (HOSPITAL_BASED_OUTPATIENT_CLINIC_OR_DEPARTMENT_OTHER): Payer: Self-pay | Admitting: Surgery

## 2020-08-30 NOTE — Progress Notes (Signed)
Spoke w/ via phone for pre-op interview--- Pt Lab needs dos----  Urine preg             Lab results------ no COVID test -----patient states asymptomatic no test needed Arrive at ------- 1045 on 09-03-2020 NPO after MN NO Solid Food.  Clear liquids from MN until--- 0945 Med rec completed Medications to take morning of surgery ----- Zyrtec, Lexapro Diabetic medication ----- n/a Patient instructed no nail polish to be worn day of surgery Patient instructed to bring photo id and insurance card day of surgery Patient aware to have Driver (ride ) / caregiver for 24 hours after surgery -- daughter, Monica [name] Patient Special Instructions ----- pt will be picking up ensure pre-surgery drink w/ handout instructions 08-31-2020 at front desk @[facility]  Pre-Op special Istructions ----- n/a Patient verbalized understanding of instructions that were given at this phone interview. Patient denies shortness of breath, chest pain, fever, cough at this phone interview.

## 2020-09-02 NOTE — H&P (Signed)
Monica [name]  Location: [facility] Patient #: [id number] DOB: November 26, 1970 Single / Language: [place] / Race: White Female   History of Present Illness  The patient is a 50 year old female who presents for evaluation of gall stones.  Chief complaint: Symptomatic gallstones  This is a pleasant 50 year old female who presents with right upper quadrant abdominal pain and nausea.  This occurred several weeks ago.  She went to the walk-in clinic where she had an ultrasound ordered.  This showed cholelithiasis with a large gallstone measuring 3 x 2 cm.  She has now been feeling well and is on a low-fat diet.  She has had no further attacks.  She denies jaundice.  She is otherwise healthy without complaints.  She has no cardiopulmonary issues.  She has had no previous problems with general anesthesia   Past Surgical History  Mammoplasty; Reduction   Bilateral.  Diagnostic Studies History  Colonoscopy   never Mammogram   within last year Pap Smear   1-5 years ago  Social History Alcohol use   Occasional alcohol use. Caffeine use   Coffee. No drug use   Tobacco use   Former smoker.  Family History  Alcohol Abuse   Brother. Depression   Mother. Respiratory Condition   Father.  Pregnancy / Birth History  Age at menarche   12 years. Contraceptive History   Contraceptive implant, Oral contraceptives. Gravida   1 Irregular periods   Maternal age   79-30 Para   1  Other Problems  Anxiety Disorder   Cholelithiasis   Heart murmur      Review of Systems General Present- Night Sweats and Weight Gain. Not Present- Appetite Loss, Chills, Fatigue, Fever and Weight Loss. Skin Not Present- Change in Wart/Mole, Dryness, Hives, Jaundice, New Lesions, Non-Healing Wounds, Rash and Ulcer. HEENT Present- Seasonal Allergies and Wears glasses/contact lenses. Not Present- Earache, Hearing Loss, Hoarseness, Nose Bleed, Oral Ulcers, Ringing in the Ears, Sinus Pain, Sore Throat,  Visual Disturbances and Yellow Eyes. Respiratory Not Present- Bloody sputum, Chronic Cough, Difficulty Breathing, Snoring and Wheezing. Breast Not Present- Breast Mass, Breast Pain, Nipple Discharge and Skin Changes. Cardiovascular Not Present- Chest Pain, Difficulty Breathing Lying Down, Leg Cramps, Palpitations, Rapid Heart Rate, Shortness of Breath and Swelling of Extremities. Gastrointestinal Present- Abdominal Pain, Bloating and Change in Bowel Habits. Not Present- Bloody Stool, Chronic diarrhea, Constipation, Difficulty Swallowing, Excessive gas, Gets full quickly at meals, Hemorrhoids, Indigestion, Nausea, Rectal Pain and Vomiting. Female Genitourinary Not Present- Frequency, Nocturia, Painful Urination, Pelvic Pain and Urgency. Musculoskeletal Not Present- Back Pain, Joint Pain, Joint Stiffness, Muscle Pain, Muscle Weakness and Swelling of Extremities. Neurological Not Present- Decreased Memory, Fainting, Headaches, Numbness, Seizures, Tingling, Tremor, Trouble walking and Weakness. Psychiatric Present- Anxiety. Not Present- Bipolar, Change in Sleep Pattern, Depression, Fearful and Frequent crying. Endocrine Not Present- Cold Intolerance, Excessive Hunger, Hair Changes, Heat Intolerance, Hot flashes and New Diabetes. Hematology Not Present- Blood Thinners, Easy Bruising, Excessive bleeding, Gland problems, HIV and Persistent Infections.  Vitals  Weight: 224 lb   Height: 65 in  Body Surface Area: 2.08 m   Body Mass Index: 37.28 kg/m   Temp.: 97.8 F    Pulse: 110 (Regular)    P.OX: 98% (Room air) BP: 120/64(Sitting, Left Arm, Standard)     The physical exam findings are as follows: Note:  She appears well and exam  Her abdomen is soft. There is the slightest amount of guarding in the right upper quadrant with minimal tenderness.  There is no organomegaly.  Lungs clear  CV RRR  Skin without rash  Neuro grossly intact    Assessment & Plan  SYMPTOMATIC CHOLELITHIASIS  (K80.20)  Impression: I have reviewed her notes in the electronic medical records. I have reviewed her ultrasound showing a large gallstone measuring 3 cm. Her symptoms appear consistent with biliary colic. Given her symptoms and the size of the gallstone, cholecystectomy is recommended. I discussed laparoscopic ostectomy in detail with her. I discussed the procedure in detail. The patient was given Agricultural engineer. We discussed the risks and benefits of a laparoscopic cholecystectomy and possible cholangiogram including, but not limited to, bleeding, infection, injury to surrounding structures such as the intestine or liver, bile leak, retained gallstones, need to convert to an open procedure, prolonged diarrhea, blood clots such as DVT, common bile duct injury, anesthesia risks, and possible need for additional procedures. The likelihood of improvement in symptoms and return to the patient's normal status is good. We discussed the typical post-operative recovery course. All questions were answered.

## 2020-09-03 ENCOUNTER — Other Ambulatory Visit: Payer: Self-pay

## 2020-09-03 ENCOUNTER — Ambulatory Visit (HOSPITAL_BASED_OUTPATIENT_CLINIC_OR_DEPARTMENT_OTHER): Payer: BC Managed Care – PPO | Admitting: Certified Registered"

## 2020-09-03 ENCOUNTER — Encounter (HOSPITAL_BASED_OUTPATIENT_CLINIC_OR_DEPARTMENT_OTHER): Payer: Self-pay | Admitting: Surgery

## 2020-09-03 ENCOUNTER — Ambulatory Visit (HOSPITAL_BASED_OUTPATIENT_CLINIC_OR_DEPARTMENT_OTHER)
Admission: RE | Admit: 2020-09-03 | Discharge: 2020-09-03 | Disposition: A | Discharge status: Home / Self Care | Payer: BC Managed Care – PPO | Attending: Surgery | Admitting: Surgery

## 2020-09-03 ENCOUNTER — Encounter (HOSPITAL_BASED_OUTPATIENT_CLINIC_OR_DEPARTMENT_OTHER)
Admission: RE | Disposition: A | Discharge status: Home / Self Care | Payer: Self-pay | Source: Home / Self Care | Attending: Surgery

## 2020-09-03 DIAGNOSIS — K8010 Calculus of gallbladder with chronic cholecystitis without obstruction: Secondary | ICD-10-CM | POA: Diagnosis not present

## 2020-09-03 DIAGNOSIS — Z87891 Personal history of nicotine dependence: Secondary | ICD-10-CM | POA: Insufficient documentation

## 2020-09-03 DIAGNOSIS — F411 Generalized anxiety disorder: Secondary | ICD-10-CM

## 2020-09-03 DIAGNOSIS — R1011 Right upper quadrant pain: Secondary | ICD-10-CM | POA: Diagnosis present

## 2020-09-03 HISTORY — DX: Presence of spectacles and contact lenses: Z97.3

## 2020-09-03 HISTORY — DX: Other allergic rhinitis: J30.89

## 2020-09-03 HISTORY — DX: Personal history of other specified conditions: Z87.898

## 2020-09-03 HISTORY — PX: CHOLECYSTECTOMY: SHX55

## 2020-09-03 HISTORY — DX: Generalized anxiety disorder: F41.1

## 2020-09-03 HISTORY — DX: Personal history of Methicillin resistant Staphylococcus aureus infection: Z86.14

## 2020-09-03 HISTORY — DX: Calculus of gallbladder without cholecystitis without obstruction: K80.20

## 2020-09-03 LAB — POCT PREGNANCY, URINE: Preg Test, Ur: NEGATIVE

## 2020-09-03 SURGERY — LAPAROSCOPIC CHOLECYSTECTOMY
Anesthesia: General | Site: Abdomen

## 2020-09-03 MED ORDER — LACTATED RINGERS IV SOLN: INTRAVENOUS | Status: DC

## 2020-09-03 MED ORDER — CEFAZOLIN SODIUM-DEXTROSE 2-4 GM/100ML-% IV SOLN
INTRAVENOUS | Status: AC
  Filled 2020-09-03: qty 100

## 2020-09-03 MED ORDER — ACETAMINOPHEN 500 MG PO TABS
ORAL_TABLET | ORAL | Status: AC
  Filled 2020-09-03: qty 2

## 2020-09-03 MED ORDER — LIDOCAINE HCL (PF) 2 % IJ SOLN
INTRAMUSCULAR | Status: AC
  Filled 2020-09-03: qty 5

## 2020-09-03 MED ORDER — FENTANYL CITRATE (PF) 100 MCG/2ML IJ SOLN
INTRAMUSCULAR | Status: AC
  Filled 2020-09-03: qty 2

## 2020-09-03 MED ORDER — SODIUM CHLORIDE 0.9 % IR SOLN
Status: DC | PRN
  Administered 2020-09-03: 500 mL

## 2020-09-03 MED ORDER — MIDAZOLAM HCL 5 MG/5ML IJ SOLN
INTRAMUSCULAR | Status: DC | PRN
  Administered 2020-09-03: 2 mg via INTRAVENOUS

## 2020-09-03 MED ORDER — CEFAZOLIN SODIUM-DEXTROSE 2-4 GM/100ML-% IV SOLN
2.0000 g | INTRAVENOUS | Status: AC
  Administered 2020-09-03: 2 g via INTRAVENOUS

## 2020-09-03 MED ORDER — ROCURONIUM BROMIDE 10 MG/ML (PF) SYRINGE
PREFILLED_SYRINGE | INTRAVENOUS | Status: DC | PRN
  Administered 2020-09-03: 50 mg via INTRAVENOUS

## 2020-09-03 MED ORDER — CHLORHEXIDINE GLUCONATE CLOTH 2 % EX PADS: 6.0000 | MEDICATED_PAD | Freq: Once | CUTANEOUS | Status: DC

## 2020-09-03 MED ORDER — SUGAMMADEX SODIUM 200 MG/2ML IV SOLN
INTRAVENOUS | Status: DC | PRN
  Administered 2020-09-03: 200 mg via INTRAVENOUS

## 2020-09-03 MED ORDER — ONDANSETRON HCL 4 MG/2ML IJ SOLN
INTRAMUSCULAR | Status: DC | PRN
  Administered 2020-09-03: 4 mg via INTRAVENOUS

## 2020-09-03 MED ORDER — DEXAMETHASONE SODIUM PHOSPHATE 10 MG/ML IJ SOLN
INTRAMUSCULAR | Status: DC | PRN
  Administered 2020-09-03: 10 mg via INTRAVENOUS

## 2020-09-03 MED ORDER — ROCURONIUM BROMIDE 10 MG/ML (PF) SYRINGE
PREFILLED_SYRINGE | INTRAVENOUS | Status: AC
  Filled 2020-09-03: qty 10

## 2020-09-03 MED ORDER — MIDAZOLAM HCL 2 MG/2ML IJ SOLN
INTRAMUSCULAR | Status: AC
  Filled 2020-09-03: qty 2

## 2020-09-03 MED ORDER — DEXAMETHASONE SODIUM PHOSPHATE 10 MG/ML IJ SOLN
INTRAMUSCULAR | Status: AC
  Filled 2020-09-03: qty 1

## 2020-09-03 MED ORDER — PROPOFOL 10 MG/ML IV BOLUS
INTRAVENOUS | Status: DC | PRN
  Administered 2020-09-03: 200 mg via INTRAVENOUS

## 2020-09-03 MED ORDER — FENTANYL CITRATE (PF) 100 MCG/2ML IJ SOLN
INTRAMUSCULAR | Status: DC | PRN
  Administered 2020-09-03 (×2): 50 ug via INTRAVENOUS

## 2020-09-03 MED ORDER — BUPIVACAINE HCL 0.5 % IJ SOLN
INTRAMUSCULAR | Status: DC | PRN
  Administered 2020-09-03: 20 mL

## 2020-09-03 MED ORDER — ONDANSETRON HCL 4 MG/2ML IJ SOLN: 4.0000 mg | Freq: Once | INTRAMUSCULAR | Status: DC | PRN

## 2020-09-03 MED ORDER — OXYCODONE HCL 5 MG/5ML PO SOLN: 5.0000 mg | Freq: Once | ORAL | Status: AC | PRN

## 2020-09-03 MED ORDER — OXYCODONE HCL 5 MG PO TABS: 5.0000 mg | ORAL_TABLET | Freq: Four times a day (QID) | ORAL | 0 refills | Status: DC | PRN

## 2020-09-03 MED ORDER — AMISULPRIDE (ANTIEMETIC) 5 MG/2ML IV SOLN: 10.0000 mg | Freq: Once | INTRAVENOUS | Status: DC | PRN

## 2020-09-03 MED ORDER — ONDANSETRON HCL 4 MG/2ML IJ SOLN
INTRAMUSCULAR | Status: AC
  Filled 2020-09-03: qty 2

## 2020-09-03 MED ORDER — LIDOCAINE 2% (20 MG/ML) 5 ML SYRINGE
INTRAMUSCULAR | Status: DC | PRN
  Administered 2020-09-03: 80 mg via INTRAVENOUS

## 2020-09-03 MED ORDER — FENTANYL CITRATE (PF) 100 MCG/2ML IJ SOLN
25.0000 ug | INTRAMUSCULAR | Status: DC | PRN
  Administered 2020-09-03 (×2): 25 ug via INTRAVENOUS

## 2020-09-03 MED ORDER — ENSURE PRE-SURGERY PO LIQD: 296.0000 mL | Freq: Once | ORAL | Status: DC

## 2020-09-03 MED ORDER — OXYCODONE HCL 5 MG PO TABS
5.0000 mg | ORAL_TABLET | Freq: Once | ORAL | Status: AC | PRN
  Administered 2020-09-03: 5 mg via ORAL

## 2020-09-03 MED ORDER — OXYCODONE HCL 5 MG PO TABS
ORAL_TABLET | ORAL | Status: AC
  Filled 2020-09-03: qty 1

## 2020-09-03 MED ORDER — PROPOFOL 10 MG/ML IV BOLUS
INTRAVENOUS | Status: AC
  Filled 2020-09-03: qty 40

## 2020-09-03 MED ORDER — ACETAMINOPHEN 500 MG PO TABS
1000.0000 mg | ORAL_TABLET | ORAL | Status: AC
  Administered 2020-09-03: 1000 mg via ORAL

## 2020-09-03 SURGICAL SUPPLY — 44 items
ADH SKN CLS APL DERMABOND .7 (GAUZE/BANDAGES/DRESSINGS) ×1
APL PRP STRL LF DISP 70% ISPRP (MISCELLANEOUS) ×1
APL SWBSTK 6 STRL LF DISP (MISCELLANEOUS) ×1
APPLICATOR COTTON TIP 6 STRL (MISCELLANEOUS) ×1 IMPLANT
APPLICATOR COTTON TIP 6IN STRL (MISCELLANEOUS) ×3
APPLIER CLIP 5 13 M/L LIGAMAX5 (MISCELLANEOUS) ×3
APR CLP MED LRG 5 ANG JAW (MISCELLANEOUS) ×1
BAG RETRIEVAL 10 (BASKET) ×1
BAG RETRIEVAL 10MM (BASKET) ×1
BLADE CLIPPER SENSICLIP SURGIC (BLADE) IMPLANT
BLADE SURG 15 STRL LF DISP TIS (BLADE) ×1 IMPLANT
BLADE SURG 15 STRL SS (BLADE) ×3
CABLE HIGH FREQUENCY MONO STRZ (ELECTRODE) ×3 IMPLANT
CANISTER SUCT 1200ML W/VALVE (MISCELLANEOUS) IMPLANT
CHLORAPREP W/TINT 26 (MISCELLANEOUS) ×3 IMPLANT
CLIP APPLIE 5 13 M/L LIGAMAX5 (MISCELLANEOUS) ×1 IMPLANT
COVER WAND RF STERILE (DRAPES) ×3 IMPLANT
DERMABOND ADVANCED (GAUZE/BANDAGES/DRESSINGS) ×2
DERMABOND ADVANCED .7 DNX12 (GAUZE/BANDAGES/DRESSINGS) ×1 IMPLANT
DISSECTOR BLUNT TIP ENDO 5MM (MISCELLANEOUS) IMPLANT
DRAPE C-ARM 42X120 X-RAY (DRAPES) ×1 IMPLANT
ELECT REM PT RETURN 9FT ADLT (ELECTROSURGICAL) ×3
ELECTRODE REM PT RTRN 9FT ADLT (ELECTROSURGICAL) ×1 IMPLANT
GLOVE SURG PR MICRO ENCORE 7.5 (GLOVE) ×3 IMPLANT
GLOVE SURG UNDER POLY LF SZ7 (GLOVE) ×2 IMPLANT
GLOVE SURG UNDER POLY LF SZ7.5 (GLOVE) IMPLANT
GOWN STRL REUS W/TWL 2XL LVL3 (GOWN DISPOSABLE) ×2 IMPLANT
GOWN STRL REUS W/TWL XL LVL3 (GOWN DISPOSABLE) ×3 IMPLANT
HEMOSTAT SURGICEL 2X4 FIBR (HEMOSTASIS) IMPLANT
KIT TURNOVER CYSTO (KITS) ×3 IMPLANT
PACK BASIN DAY SURGERY FS (CUSTOM PROCEDURE TRAY) ×3 IMPLANT
SCISSORS LAP 5X35 DISP (ENDOMECHANICALS) ×3 IMPLANT
SET CHOLANGIOGRAPH MIX (MISCELLANEOUS) IMPLANT
SET SUCTION IRRIG HYDROSURG (IRRIGATION / IRRIGATOR) ×3 IMPLANT
SET TUBE SMOKE EVAC HIGH FLOW (TUBING) ×3 IMPLANT
SUT MON AB 4-0 PC3 18 (SUTURE) ×3 IMPLANT
SUT VICRYL 0 UR6 27IN ABS (SUTURE) IMPLANT
SYS BAG RETRIEVAL 10MM (BASKET) ×1
SYSTEM BAG RETRIEVAL 10MM (BASKET) IMPLANT
TOWEL OR 17X26 10 PK STRL BLUE (TOWEL DISPOSABLE) ×3 IMPLANT
TRAY LAPAROSCOPIC (CUSTOM PROCEDURE TRAY) ×3 IMPLANT
TROCAR XCEL BLADELESS 5X75MML (TROCAR) ×9 IMPLANT
TROCAR XCEL BLUNT TIP 100MML (ENDOMECHANICALS) ×3 IMPLANT
WATER STERILE IRR 500ML POUR (IV SOLUTION) IMPLANT

## 2020-09-03 NOTE — Anesthesia Procedure Notes (Signed)
Procedure Name: Intubation Date/Time: 09/03/2020 11:54 AM Performed by: Rogers Blocker, CRNA Pre-anesthesia Checklist: Patient identified, Emergency Drugs available, Suction available and Patient being monitored Patient Re-evaluated:Patient Re-evaluated prior to induction Oxygen Delivery Method: Circle System Utilized Preoxygenation: Pre-oxygenation with 100% oxygen Induction Type: IV induction Ventilation: Mask ventilation without difficulty Laryngoscope Size: Mac and 3 Grade View: Grade I Tube type: Oral Number of attempts: 1 Airway Equipment and Method: Stylet Placement Confirmation: ETT inserted through vocal cords under direct vision, positive ETCO2 and breath sounds checked- equal and bilateral Secured at: 22 cm Tube secured with: Tape Dental Injury: Teeth and Oropharynx as per pre-operative assessment

## 2020-09-03 NOTE — Transfer of Care (Signed)
Immediate Anesthesia Transfer of Care Note  Patient: Monica [name]  Procedure(s) Performed: LAPAROSCOPIC CHOLECYSTECTOMY (Abdomen)  Patient Location: PACU  Anesthesia Type:General  Level of Consciousness: awake and alert   Airway & Oxygen Therapy: Patient Spontanous Breathing and Patient connected to face mask oxygen  Post-op Assessment: Report given to RN and Post -op Vital signs reviewed and stable  Post vital signs: Reviewed and stable  Last Vitals:  Vitals Value Taken Time  BP    Temp    Pulse 75 09/03/20 1238  Resp 13 09/03/20 1238  SpO2 96 % 09/03/20 1238  Vitals shown include unvalidated device data.  Last Pain:  Vitals:   09/03/20 1050  TempSrc: Oral  PainSc: 0-No pain      Patients Stated Pain Goal: 5 (09/03/20 1050)  Complications: No notable events documented.

## 2020-09-03 NOTE — Anesthesia Postprocedure Evaluation (Signed)
Anesthesia Post Note  Patient: Monica [name]  Procedure(s) Performed: LAPAROSCOPIC CHOLECYSTECTOMY (Abdomen)     Patient location during evaluation: PACU Anesthesia Type: General Level of consciousness: awake and alert Pain management: pain level controlled Vital Signs Assessment: post-procedure vital signs reviewed and stable Respiratory status: spontaneous breathing, nonlabored ventilation and respiratory function stable Cardiovascular status: blood pressure returned to baseline and stable Postop Assessment: no apparent nausea or vomiting Anesthetic complications: no   No notable events documented.  Last Vitals:  Vitals:   09/03/20 1330 09/03/20 1425  BP: (!) 104/48 131/67  Pulse: 71 79  Resp: 16 18  Temp: 36.4 C 36.6 C  SpO2: 96% 100%    Last Pain:  Vitals:   09/03/20 1425  TempSrc:   PainSc: 4                  Lucretia Kern

## 2020-09-03 NOTE — Discharge Instructions (Addendum)
[facility] ______[facility] LAPAROSCOPIC SURGERY: POST OP INSTRUCTIONS Always review your discharge instruction sheet given to you by the facility where your surgery was performed. IF YOU HAVE DISABILITY OR FAMILY LEAVE FORMS, YOU MUST BRING THEM TO THE OFFICE FOR PROCESSING.   DO NOT GIVE THEM TO YOUR DOCTOR.  A prescription for pain medication may be given to you upon discharge.  Take your pain medication as prescribed, if needed.  If narcotic pain medicine is not needed, then you may take acetaminophen (Tylenol) or ibuprofen (Advil) as needed. Take your usually prescribed medications unless otherwise directed. If you need a refill on your pain medication, please contact your pharmacy.  They will contact our office to request authorization. Prescriptions will not be filled after 5pm or on week-ends. You should follow a light diet the first few days after arrival home, such as soup and crackers, etc.  Be sure to include lots of fluids daily. Most patients will experience some swelling and bruising in the area of the incisions.  Ice packs will help.  Swelling and bruising can take several days to resolve.  It is common to experience some constipation if taking pain medication after surgery.  Increasing fluid intake and taking a stool softener (such as Colace) will usually help or prevent this problem from occurring.  A mild laxative (Milk of Magnesia or Miralax) should be taken according to package instructions if there are no bowel movements after 48 hours. Unless discharge instructions indicate otherwise, you may remove your bandages 24-48 hours after surgery, and you may shower at that time.  You may have steri-strips (small skin tapes) in place directly over the incision.  These strips should be left on the skin for 7-10 days.  If your surgeon used skin glue on the incision, you may shower in 24 hours.  The glue will flake off over the next 2-3 weeks.  Any sutures or staples will be  removed at the office during your follow-up visit. ACTIVITIES:  You may resume regular (light) daily activities beginning the next day--such as daily self-care, walking, climbing stairs--gradually increasing activities as tolerated.  You may have sexual intercourse when it is comfortable.  Refrain from any heavy lifting or straining until approved by your doctor. You may drive when you are no longer taking prescription pain medication, you can comfortably wear a seatbelt, and you can safely maneuver your car and apply brakes. RETURN TO WORK:  __________________________________________________________ You should see your doctor in the office for a follow-up appointment approximately 2-3 weeks after your surgery.  Make sure that you call for this appointment within a day or two after you arrive home to insure a convenient appointment time. OTHER INSTRUCTIONS: OK TO SHOWER STARTING TOMORROW ICE PACK, TYLENOL, AND IBUPROFEN ALSO FOR PAIN NO LIFTING MORE THAN 15 TO 20 POUNDS FOR 2 WEEKS __________________________________________________________________________________________________________________________ __________________________________________________________________________________________________________________________ WHEN TO CALL YOUR DOCTOR: Fever over 101.0 Inability to urinate Continued bleeding from incision. Increased pain, redness, or drainage from the incision. Increasing abdominal pain  The clinic staff is available to answer your questions during regular business hours.  Please don't hesitate to call and ask to speak to one of the nurses for clinical concerns.  If you have a medical emergency, go to the nearest emergency room or call 911.  A surgeon from [facility] is always on call at the hospital. [address], [address], [place], [place]  [address] ? [address] [address], [place], [place]   [address] [phone] ?   [phone] ? FAX [phone] Web site:  [email or web address]    Post Anesthesia Home Care Instructions  Activity: Get plenty of rest for the remainder of the day. A responsible individual must stay with you for 24 hours following the procedure.  For the next 24 hours, DO NOT: -Drive a car -Advertising copywriter -Drink alcoholic beverages -Take any medication unless instructed by your physician -Make any legal decisions or sign important papers.  Meals: Start with liquid foods such as gelatin or soup. Progress to regular foods as tolerated. Avoid greasy, spicy, heavy foods. If nausea and/or vomiting occur, drink only clear liquids until the nausea and/or vomiting subsides. Call your physician if vomiting continues.  Special Instructions/Symptoms: Your throat may feel dry or sore from the anesthesia or the breathing tube placed in your throat during surgery. If this causes discomfort, gargle with warm salt water. The discomfort should disappear within 24 hours.  If you had a scopolamine patch placed behind your ear for the management of post- operative nausea and/or vomiting:  1. The medication in the patch is effective for 72 hours, after which it should be removed.  Wrap patch in a tissue and discard in the trash. Wash hands thoroughly with soap and water. 2. You may remove the patch earlier than 72 hours if you experience unpleasant side effects which may include dry mouth, dizziness or visual disturbances. 3. Avoid touching the patch. Wash your hands with soap and water after contact with the patch.

## 2020-09-03 NOTE — Anesthesia Preprocedure Evaluation (Signed)
Anesthesia Evaluation  Patient identified by MRN, date of birth, ID band Patient awake    Reviewed: Allergy & Precautions, NPO status , Patient's Chart, lab work & pertinent test results  History of Anesthesia Complications Negative for: history of anesthetic complications  Airway Mallampati: II  TM Distance: >3 FB Neck ROM: Full    Dental  (+) Teeth Intact   Pulmonary neg pulmonary ROS, former smoker,    Pulmonary exam normal        Cardiovascular negative cardio ROS Normal cardiovascular exam  Normal echo 2013   Neuro/Psych  Headaches, Anxiety    GI/Hepatic negative GI ROS, Neg liver ROS, Symptomatic cholelithiasis   Endo/Other  negative endocrine ROS  Renal/GU negative Renal ROS  negative genitourinary   Musculoskeletal negative musculoskeletal ROS (+)   Abdominal   Peds  Hematology negative hematology ROS (+)   Anesthesia Other Findings   Reproductive/Obstetrics negative OB ROS                             Anesthesia Physical Anesthesia Plan  ASA: 2  Anesthesia Plan: General   Post-op Pain Management:    Induction: Intravenous  PONV Risk Score and Plan: 4 or greater and Ondansetron, Dexamethasone, Treatment may vary due to age or medical condition and Midazolam  Airway Management Planned: Oral ETT  Additional Equipment: None  Intra-op Plan:   Post-operative Plan: Extubation in OR  Informed Consent: I have reviewed the patients History and Physical, chart, labs and discussed the procedure including the risks, benefits and alternatives for the proposed anesthesia with the patient or authorized representative who has indicated his/her understanding and acceptance.     Dental advisory given  Plan Discussed with:   Anesthesia Plan Comments:         Anesthesia Quick Evaluation

## 2020-09-03 NOTE — Op Note (Signed)
Laparoscopic Cholecystectomy Procedure Note  Indications: This patient presents with symptomatic gallbladder disease and will undergo laparoscopic cholecystectomy.  Pre-operative Diagnosis: symptomatic cholelithiasis  Post-operative Diagnosis: Same  Surgeon: Abigail Miyamoto   Assistants: non3  Anesthesia: General endotracheal anesthesia  ASA Class: 2  Procedure Details  The patient was seen again in the Holding Room. The risks, benefits, complications, treatment options, and expected outcomes were discussed with the patient. The possibilities of reaction to medication, pulmonary aspiration, perforation of viscus, bleeding, recurrent infection, finding a normal gallbladder, the need for additional procedures, failure to diagnose a condition, the possible need to convert to an open procedure, and creating a complication requiring transfusion or operation were discussed with the patient. The likelihood of improving the patient's symptoms with return to their baseline status is good.  The patient and/or family concurred with the proposed plan, giving informed consent. The site of surgery properly noted. The patient was taken to Operating Room, identified as Monica [name] and the procedure verified as Laparoscopic Cholecystectomy with Intraoperative Cholangiogram. A Time Out was held and the above information confirmed.  Prior to the induction of general anesthesia, antibiotic prophylaxis was administered. General endotracheal anesthesia was then administered and tolerated well. After the induction, the abdomen was prepped with Chloraprep and draped in sterile fashion. The patient was positioned in the supine position.  Local anesthetic agent was injected into the skin near the umbilicus and an incision made. We dissected down to the abdominal fascia with blunt dissection.  The fascia was incised vertically and we entered the peritoneal cavity bluntly.  A pursestring suture of 0-Vicryl was placed  around the fascial opening.  The Hasson cannula was inserted and secured with the stay suture.  Pneumoperitoneum was then created with CO2 and tolerated well without any adverse changes in the patient's vital signs. A 5-mm port was placed in the subxiphoid position.  Two 5-mm ports were placed in the right upper quadrant. All skin incisions were infiltrated with a local anesthetic agent before making the incision and placing the trocars.   We positioned the patient in reverse Trendelenburg, tilted slightly to the patient's left.  The gallbladder was identified, the fundus grasped and retracted cephalad. Adhesions were lysed bluntly and with the electrocautery where indicated, taking care not to injure any adjacent organs or viscus. The infundibulum was grasped and retracted laterally, exposing the peritoneum overlying the triangle of Calot. This was then divided and exposed in a blunt fashion. The cystic duct was clearly identified and bluntly dissected circumferentially. A critical view of the cystic duct and cystic artery was obtained.  The cystic duct was then ligated with clips and divided. The cystic artery was, dissected free, ligated with clips and divided as well. A small posterior branch of the artery was also clipped.  The gallbladder was dissected from the liver bed in retrograde fashion with the electrocautery. The gallbladder was removed and placed in an Endocatch sac. The liver bed was irrigated and inspected. Hemostasis was achieved with the electrocautery. Copious irrigation was utilized and was repeatedly aspirated until clear.  The gallbladder and Endocatch sac were then removed through the umbilical port site.  The pursestring suture was used to close the umbilical fascia.    We again inspected the right upper quadrant for hemostasis.  Pneumoperitoneum was released as we removed the trocars.  4-0 Monocryl was used to close the skin.   Skin glue was then applied. The patient was then  extubated and brought to the recovery room  in stable condition. Instrument, sponge, and needle counts were correct at closure and at the conclusion of the case.   Findings: Chronic Cholecystitis with Cholelithiasis  Estimated Blood Loss: Minimal         Drains: 0         Specimens: Gallbladder           Complications: None; patient tolerated the procedure well.         Disposition: PACU - hemodynamically stable.         Condition: stable

## 2020-09-03 NOTE — Interval H&P Note (Signed)
History and Physical Interval Note: no change in H and P  09/03/2020 11:06 AM  Monica [name]  has presented today for surgery, with the diagnosis of SYMPTOMATIC GALLSTONES.  The various methods of treatment have been discussed with the patient and family. After consideration of risks, benefits and other options for treatment, the patient has consented to  Procedure(s): LAPAROSCOPIC CHOLECYSTECTOMY (N/A) as a surgical intervention.  The patient's history has been reviewed, patient examined, no change in status, stable for surgery.  I have reviewed the patient's chart and labs.  Questions were answered to the patient's satisfaction.     Abigail Miyamoto

## 2020-09-04 ENCOUNTER — Other Ambulatory Visit: Payer: Self-pay | Admitting: Adult Health

## 2020-09-04 ENCOUNTER — Encounter (HOSPITAL_BASED_OUTPATIENT_CLINIC_OR_DEPARTMENT_OTHER): Payer: Self-pay | Admitting: Surgery

## 2020-09-04 DIAGNOSIS — F411 Generalized anxiety disorder: Secondary | ICD-10-CM

## 2020-09-04 LAB — SURGICAL PATHOLOGY

## 2020-09-05 ENCOUNTER — Other Ambulatory Visit: Payer: Self-pay | Admitting: Adult Health

## 2020-09-05 DIAGNOSIS — F411 Generalized anxiety disorder: Secondary | ICD-10-CM

## 2020-09-05 NOTE — Telephone Encounter (Signed)
Last filled 5/31

## 2020-09-18 ENCOUNTER — Other Ambulatory Visit: Payer: Self-pay

## 2020-09-18 ENCOUNTER — Ambulatory Visit (INDEPENDENT_AMBULATORY_CARE_PROVIDER_SITE_OTHER): Payer: BC Managed Care – PPO | Admitting: Addiction (Substance Use Disorder)

## 2020-09-18 DIAGNOSIS — F411 Generalized anxiety disorder: Secondary | ICD-10-CM | POA: Diagnosis not present
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
## 2020-09-27 ENCOUNTER — Other Ambulatory Visit: Payer: Self-pay

## 2020-09-27 ENCOUNTER — Ambulatory Visit: Payer: BC Managed Care – PPO | Admitting: Addiction (Substance Use Disorder)

## 2020-10-03 ENCOUNTER — Other Ambulatory Visit: Payer: Self-pay | Admitting: Adult Health

## 2020-10-03 DIAGNOSIS — F411 Generalized anxiety disorder: Secondary | ICD-10-CM

## 2020-10-04 ENCOUNTER — Other Ambulatory Visit: Payer: Self-pay

## 2020-10-04 ENCOUNTER — Ambulatory Visit (INDEPENDENT_AMBULATORY_CARE_PROVIDER_SITE_OTHER): Payer: BC Managed Care – PPO | Admitting: Addiction (Substance Use Disorder)

## 2020-10-04 DIAGNOSIS — F411 Generalized anxiety disorder: Secondary | ICD-10-CM

## 2020-10-04 NOTE — Progress Notes (Signed)
      [facility] Counselor/Therapist Progress Note  Patient ID: [name], MRN: [id number],    Date: 10/04/2020  Time Spent:  Treatment Type: Individual Therapy  Reported Symptoms: low self esteem & shame  Mental Status Exam:  Appearance:   Casual and Well Groomed     Behavior:  Appropriate and Sharing  Motor:  Normal  Speech/Language:   Normal Rate  Affect:  Appropriate, Congruent, Full Range, and Tearful  Mood:  anxious, labile, and sad  Thought process:  normal  Thought content:    Rumination  Sensory/Perceptual disturbances:    Flashback  Orientation:  x4  Attention:  [name]  Concentration:  Fair  Memory:  WNL  Fund of knowledge:   [name]  Insight:    [name]  Judgment:   Fair  Impulse Control:  [name]   Risk Assessment: Danger to Self:  No- No SI. No plan, no means, no intent.  Self-injurious Behavior: No Danger to Others: No Duty to Warn:no Physical Aggression / Violence:No  Access to Firearms a concern: No  Gang Involvement:No    Subjective: Client processed the childhood pain of being told to diet when she was young. Client talked about how that affected her self esteem and her relationship with food since she was a teen. Client reported struggling to eat too much do to emotional thoughts. Client working towards her goals going to a weight management clinic and getting support for her health with nutritionists and doctors and counselors. Client also set a goal of walking her dog 15 mins 5 times a week. Therapist used MI & CBT with client to support her as she processed old hard memories about her childhood weight gain and her self esteem issues now & worked with client to challenge negative self talk. Therapist assessed for safety and stability and client denied SI/HI/AVH.  Interventions: Cognitive Behavioral Therapy, Motivational Interviewing, and RPT    Diagnosis:   ICD-10-CM   1. Generalized anxiety disorder  F41.1       Plan of Care: Client to  return for weekly therapy with Monica [name], therapist, to review again in 6 months.  Client is to being seeing medication provider for support of mood management for depression, passive SI, and insomnia.    Client to engage in CBT: challenging negative internal ruminations and self-talk AEB journaling daily or expressing thoughts to support persons in their life and then challenging it with truth.  Client to engage in tapping to tap in healthy cognition that challenges negative rumination or deep negative core belief.  Client to practice DBT distress tolerance skills to decrease crying spells and thoughts of not being able to endure their suffering AEB using mindfulness, deep breathing, and TIP to increase tolerance for discomfort, discharge emotional distress, and increase their understanding that they can do hard things.  Client to utilize BSP (brainspotting) with therapist to help client identify and process triggers for her her depression, anxiety, and abandonment with goal of reducing said SUDs caused by depression/anxiety by 33% in the next 6 months.  Client to prioritize sleep 8+ hours each week night AEB going to bed by 10pm each night.  Client participated in the treatment planning of their therapy. Client agreed with the plan and understands what to do if there is a crisis: call 9-1-1 and/or crisis line given by therapist.   Monica Good, LCSW, LCAS, CCTP, CCS-I, BSP

## 2020-10-04 NOTE — Telephone Encounter (Signed)
Last apt 04/05/20, due back at 6 months  Nothing scheduled.   ADM: please get scheduled for f/up with Rene Kocher

## 2020-10-09 ENCOUNTER — Ambulatory Visit: Payer: BC Managed Care – PPO | Admitting: Addiction (Substance Use Disorder)

## 2020-10-22 ENCOUNTER — Ambulatory Visit (INDEPENDENT_AMBULATORY_CARE_PROVIDER_SITE_OTHER): Payer: BC Managed Care – PPO | Admitting: Addiction (Substance Use Disorder)

## 2020-10-22 ENCOUNTER — Other Ambulatory Visit: Payer: Self-pay

## 2020-10-22 DIAGNOSIS — F411 Generalized anxiety disorder: Secondary | ICD-10-CM

## 2020-10-22 NOTE — Progress Notes (Signed)
[facility] Counselor/Therapist Progress Note  Patient ID: Monica Monica [name], MRN: [id number],    Date: 10/22/2020  Time Spent:  Treatment Type: Individual Therapy  Reported Symptoms: anxious about school  Mental Status Exam:  Appearance:   Casual and Well Groomed     Behavior:  Appropriate and Sharing  Motor:  Normal  Speech/Language:   Normal Rate  Affect:  Appropriate, Congruent, and Full Range  Mood:  anxious  Thought process:  normal  Thought content:    Rumination  Sensory/Perceptual disturbances:    Flashback  Orientation:  x4  Attention:  Monica [name]  Concentration:  Fair  Memory:  WNL  Fund of knowledge:   Monica [name]  Insight:    Monica [name]  Judgment:   Monica [name]  Impulse Control:  Monica [name]   Risk Assessment: Danger to Self:  No- No SI. No plan, no means, no intent.  Self-injurious Behavior: No Danger to Others: No Duty to Warn:no Physical Aggression / Violence:No  Access to Firearms a concern: No  Gang Involvement:No    Subjective: Client processed her feelings about returning to school and being emotionally ready to get back into the swing of things. Client discussed her trouble with balancing work with a healthy emotional state and enough self care. Client processed the stress of her work affecting her mental health when she had the "breakdown" over a year ago. Therapist used MI, CBT, and RPT with client to support her in processing her anxiety and also help her plan for ways to take care of her mental health implementing healthy coping skills as a part of her RP plan. Client discussed more changes in her relationships and her apprehension about no longer being single and the expectations that she has for the guy that she thinks he cant meet. Client feeling sad he cant prioritize time with her if it impedes on the time with his son, but is trying to let go of that sadness that can keep her from building the relationship with him. Therapist assessed for safety and stability and  client denied SI/HI/AVH.  Interventions: Cognitive Behavioral Therapy, Motivational Interviewing, and RPT    Diagnosis:   ICD-10-CM   1. Generalized anxiety disorder  F41.1       Plan of Care: Client to return for weekly therapy with Monica Monica [name], therapist, to review again in 6 months.  Client is to being seeing medication provider for support of mood management for depression, passive SI, and insomnia.    Client to engage in CBT: challenging negative internal ruminations and self-talk AEB journaling daily or expressing thoughts to support persons in their life and then challenging it with truth.  Client to engage in tapping to tap in healthy cognition that challenges negative rumination or deep negative core belief.  Client to practice DBT distress tolerance skills to decrease crying spells and thoughts of not being able to endure their suffering AEB using mindfulness, deep breathing, and TIP to increase tolerance for discomfort, discharge emotional distress, and increase their understanding that they can do hard things.  Client to utilize BSP (brainspotting) with therapist to help client identify and process triggers for her her depression, anxiety, and abandonment with goal of reducing said SUDs caused by depression/anxiety by 33% in the next 6 months.  Client to prioritize sleep 8+ hours each week night AEB going to bed by 10pm each night.  Client participated in the treatment planning of their therapy. Client agreed with the plan and understands what to do  if there is a crisis: call 9-1-1 and/or crisis line given by therapist.   Monica Good, LCSW, LCAS, CCTP, CCS-I, BSP

## 2020-10-29 ENCOUNTER — Other Ambulatory Visit: Payer: Self-pay | Admitting: Nurse Practitioner

## 2020-10-29 DIAGNOSIS — Z1231 Encounter for screening mammogram for malignant neoplasm of breast: Secondary | ICD-10-CM

## 2020-10-31 ENCOUNTER — Ambulatory Visit: Payer: BC Managed Care – PPO

## 2020-11-01 ENCOUNTER — Ambulatory Visit: Payer: BC Managed Care – PPO

## 2020-11-01 ENCOUNTER — Other Ambulatory Visit: Payer: Self-pay | Admitting: Adult Health

## 2020-11-01 DIAGNOSIS — F411 Generalized anxiety disorder: Secondary | ICD-10-CM

## 2020-11-02 NOTE — Telephone Encounter (Signed)
Please schedule appt

## 2020-11-02 NOTE — Telephone Encounter (Signed)
I told admins to schedule her for an appt,but pt will be out of meds over the weekend.Filled 7/28

## 2020-11-03 ENCOUNTER — Other Ambulatory Visit: Payer: Self-pay

## 2020-11-03 ENCOUNTER — Ambulatory Visit
Admission: RE | Admit: 2020-11-03 | Discharge: 2020-11-03 | Disposition: A | Discharge status: Home / Self Care | Payer: BC Managed Care – PPO | Source: Ambulatory Visit | Attending: Nurse Practitioner | Admitting: Nurse Practitioner

## 2020-11-03 DIAGNOSIS — Z1231 Encounter for screening mammogram for malignant neoplasm of breast: Secondary | ICD-10-CM

## 2020-11-05 NOTE — Telephone Encounter (Signed)
Lm for pt to schedule

## 2020-11-22 ENCOUNTER — Other Ambulatory Visit: Payer: Self-pay

## 2020-11-22 ENCOUNTER — Ambulatory Visit (INDEPENDENT_AMBULATORY_CARE_PROVIDER_SITE_OTHER): Payer: BC Managed Care – PPO | Admitting: Adult Health

## 2020-11-22 ENCOUNTER — Encounter: Payer: Self-pay | Admitting: Adult Health

## 2020-11-22 DIAGNOSIS — G4700 Insomnia, unspecified: Secondary | ICD-10-CM | POA: Diagnosis not present

## 2020-11-22 DIAGNOSIS — F4310 Post-traumatic stress disorder, unspecified: Secondary | ICD-10-CM | POA: Diagnosis not present

## 2020-11-22 DIAGNOSIS — F331 Major depressive disorder, recurrent, moderate: Secondary | ICD-10-CM

## 2020-11-22 DIAGNOSIS — F411 Generalized anxiety disorder: Secondary | ICD-10-CM | POA: Diagnosis not present

## 2020-11-22 MED ORDER — TRAZODONE HCL 100 MG PO TABS: 100.0000 mg | ORAL_TABLET | Freq: Every day | ORAL | 5 refills | Status: DC

## 2020-11-22 MED ORDER — ESCITALOPRAM OXALATE 10 MG PO TABS: 10.0000 mg | ORAL_TABLET | Freq: Every day | ORAL | 5 refills | Status: DC

## 2020-11-22 MED ORDER — CLONAZEPAM 1 MG PO TABS: ORAL_TABLET | ORAL | 2 refills | Status: DC

## 2020-11-22 NOTE — Progress Notes (Signed)
Monica [name] [id number] [birth year] 50 y.o.  Subjective:   Patient ID:  Monica [name] is a 50 y.o. (DOB [birth year]) female.  Chief Complaint: No chief complaint on file.   HPI Monica [name] presents to the office today for follow-up of MDD, GAD, PTSD and insomnia.  Describes mood today as "ok". Pleasant. Denies tearfulness. Mood symptoms - denies depression, anxiety, and irritability. Stating "I'm doing really good". Feels like medications continue to work well. Seeing therapist - Monica [name]. Stable interest and motivation. Taking medications as prescribed.  Energy levels stable. Active, has a regular exercise routine. Walking dog.  Enjoys some usual interests and activities. Single. Lives alone - daughter at [facility].  Spending time with family and friends.  Appetite adequate - emotional eater. Taking Naltrexone. Weight stable. Sleeps well most nights. Averages 8 hours. Focus and concentration has improved. Completing tasks. Managing aspects of household. 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  Denies SI or HI.  Denies AH or VH.  Previous medication trials: Adderall, Trazadone, Clonazepam     Flowsheet Row Admission (Discharged) from 09/03/2020 in [facility] from 04/01/2019 in [facility]  C-SSRS RISK CATEGORY No Risk High Risk        Review of Systems:  Review of Systems  Musculoskeletal:  Negative for gait problem.  Neurological:  Negative for tremors.  Psychiatric/Behavioral:         Please refer to HPI   Medications: I have reviewed the patient's current medications.  Current Outpatient Medications  Medication Sig Dispense Refill   cetirizine (ZYRTEC) 10 MG tablet Take 10 mg by mouth daily.     clonazePAM (KLONOPIN) 1 MG tablet TAKE 1 AND 1/2 TABLETS BY MOUTH AT BEDTIME FOR SLEEP 45 tablet 0   escitalopram (LEXAPRO) 10 MG tablet TAKE 1/2 TABLET BY MOUTH DAILY (Patient taking differently: Take 10 mg  by mouth daily.) 45 tablet 1   fluticasone (FLONASE) 50 MCG/ACT nasal spray Place 1 spray daily into both nostrils.     levonorgestrel (MIRENA) 20 MCG/DAY IUD 1 each by Intrauterine route once.     oxyCODONE (OXY IR/ROXICODONE) 5 MG immediate release tablet Take 1 tablet (5 mg total) by mouth every 6 (six) hours as needed for moderate pain, severe pain or breakthrough pain. 25 tablet 0   traZODone (DESYREL) 100 MG tablet Take one to two tablets at bedtime. (Patient taking differently: Take 100-200 mg by mouth at bedtime. Take one to two tablets at bedtime.) 180 tablet 3   valACYclovir (VALTREX) 500 MG tablet Take 500 mg by mouth 2 (two) times daily as needed (flare up).     No current facility-administered medications for this visit.    Medication Side Effects: None  Allergies:  Allergies  Allergen Reactions   Pristiq [Desvenlafaxine] Other (See Comments)    Not effective   Sertraline Other (See Comments)    Did not feel emotion   Pepto-Bismol [Bismuth Subsalicylate] Rash   Septra [Sulfamethoxazole-Trimethoprim] Rash   Sulfa Antibiotics Rash    Past Medical History:  Diagnosis Date   ADD (attention deficit disorder)    Cholelithiases    symptomatic   Environmental and seasonal allergies    GAD (generalized anxiety disorder)    Heart murmur    per pt told very faint , no symptoms, told nothing to worry about;  pt had echo done 12-24-2011 in epic ef 55-60% and trace MR   Herpes simplex type 1 infection  History of MRSA infection    left great toe in 2010   History of palpitations    ED visit in epic 05/ 2017;  evaluated by cardiology--- dr Demetrius Charity. Tenny Craw note in epic 08-31-2015 , ST on EKG , no further work-up;  pt had previou echo in epic 12-24-2011 ef 55-60%   History of suicidal behavior 03/2019   [facility] admission for IVC   Insomnia    unspecified   PTSD (post-traumatic stress disorder)    Wears glasses     Past Medical History, Surgical history, Social history, and Family  history were reviewed and updated as appropriate.   Please see review of systems for further details on the patient's review from today.   Objective:   Physical Exam:  There were no vitals taken for this visit.  Physical Exam Constitutional:      General: She is not in acute distress. Musculoskeletal:        General: No deformity.  Neurological:     Mental Status: She is alert and oriented to person, place, and time.     Coordination: Coordination normal.  Psychiatric:        Attention and Perception: Attention and perception normal. She does not perceive auditory or visual hallucinations.        Mood and Affect: Mood normal. Mood is not anxious or depressed. Affect is not labile, blunt, angry or inappropriate.        Speech: Speech normal.        Behavior: Behavior normal.        Thought Content: Thought content normal. Thought content is not paranoid or delusional. Thought content does not include homicidal or suicidal ideation. Thought content does not include homicidal or suicidal plan.        Cognition and Memory: Cognition and memory normal.        Judgment: Judgment normal.     Comments: Insight intact    Lab Review:     Component Value Date/Time   NA 137 04/01/2019 2236   K 3.3 (L) 04/01/2019 2236   CL 101 04/01/2019 2236   CO2 26 04/01/2019 2236   GLUCOSE 108 (H) 04/01/2019 2236   BUN 7 04/01/2019 2236   CREATININE 0.84 04/01/2019 2236   CALCIUM 8.8 (L) 04/01/2019 2236   PROT 7.5 04/01/2019 2236   ALBUMIN 3.9 04/01/2019 2236   AST 20 04/01/2019 2236   ALT 14 04/01/2019 2236   ALKPHOS 36 (L) 04/01/2019 2236   BILITOT 0.4 04/01/2019 2236   GFRNONAA >60 04/01/2019 2236   GFRAA >60 04/01/2019 2236       Component Value Date/Time   WBC 8.5 04/01/2019 2236   RBC 4.21 04/01/2019 2236   HGB 12.4 04/01/2019 2236   HCT 37.9 04/01/2019 2236   PLT 340 04/01/2019 2236   MCV 90.0 04/01/2019 2236   MCH 29.5 04/01/2019 2236   MCHC 32.7 04/01/2019 2236   RDW 13.6  04/01/2019 2236   LYMPHSABS 1.4 07/23/2015 1038   MONOABS 0.4 07/23/2015 1038   EOSABS 0.0 07/23/2015 1038   BASOSABS 0.0 07/23/2015 1038    No results found for: POCLITH, LITHIUM   No results found for: PHENYTOIN, PHENOBARB, VALPROATE, CBMZ   .res Assessment: Plan:    Plan:  PDMP reviewed  1. Lexapro 10mg  daily  2. Clonazepam 1mg  to 1.5mg  at hs 3. Trazdone 100mg  - 2 at hs  RTC 6 months  Patient advised to contact office with any questions, adverse effects, or acute worsening in signs  and symptoms.  Discussed potential benefits, risk, and side effects of benzodiazepines to include potential risk of tolerance and dependence, as well as possible drowsiness.  Advised patient not to drive if experiencing drowsiness and to take lowest possible effective dose to minimize risk of dependence and tolerance.  Diagnoses and all orders for this visit:  Generalized anxiety disorder  PTSD (post-traumatic stress disorder)  Major depressive disorder, recurrent episode, moderate (HCC)  Insomnia, unspecified type    Please see After Visit Summary for patient specific instructions.  Future Appointments  Date Time Provider Department Center  12/12/2020 11:00 AM Pauline Good, LCSW [facility] None  01/04/2021 11:00 AM Pauline Good, LCSW [facility] None    No orders of the defined types were placed in this encounter.   -------------------------------

## 2020-12-10 ENCOUNTER — Other Ambulatory Visit: Payer: Self-pay | Admitting: Adult Health

## 2020-12-12 ENCOUNTER — Ambulatory Visit (INDEPENDENT_AMBULATORY_CARE_PROVIDER_SITE_OTHER): Payer: BC Managed Care – PPO | Admitting: Addiction (Substance Use Disorder)

## 2020-12-12 ENCOUNTER — Other Ambulatory Visit: Payer: Self-pay

## 2020-12-12 DIAGNOSIS — F331 Major depressive disorder, recurrent, moderate: Secondary | ICD-10-CM

## 2020-12-12 DIAGNOSIS — F422 Mixed obsessional thoughts and acts: Secondary | ICD-10-CM

## 2020-12-12 NOTE — Progress Notes (Signed)
[facility] Counselor/Therapist Progress Note  Patient ID: Monica [name], MRN: [id number],    Date: 12/12/2020  Time Spent:  Treatment Type: Individual Therapy  Reported Symptoms: low self confidence and empty  Mental Status Exam:  Appearance:   Casual and Well Groomed     Behavior:  Appropriate and Sharing  Motor:  Normal  Speech/Language:   Normal Rate  Affect:  Appropriate, Congruent, and Full Range  Mood:  depressed and sad  Thought process:  normal  Thought content:    Rumination  Sensory/Perceptual disturbances:    Flashback  Orientation:  x4  Attention:  Good  Concentration:  Fair  Memory:  WNL  Fund of knowledge:   Good  Insight:    Good  Judgment:   Fair  Impulse Control:  Poor   Risk Assessment: Danger to Self:  No- No SI. No plan, no means, no intent.  Self-injurious Behavior: No Danger to Others: No Duty to Warn:no Physical Aggression / Violence:No  Access to Firearms a concern: No  Gang Involvement:No    Subjective: Client processed feeling empty and sad lately for no reason. Client wondering if she has increased depression and is looking to go back to see her doctor to increase meds possibly. Client also dealing with more obsessive thoughts and compulsive behaviors such as: overeating emotionally and avoiding social interaction. Client processed her feelings and negative thoughts about herself. Therapist used MI & CBT with client to validate her pain and loneliness while also helping to challenge her irrational self talk. Therapist also worked with client using SFT & RPT to help the client consider next steps she could take to implement some coping skills to avoid continued behavior and to help her get in a healthy routine. Therapist assessed for safety and stability and client denied SI/HI/AVH.  Interventions: Cognitive Behavioral Therapy, Motivational Interviewing, Solution-Oriented/Positive Psychology, and RPT    Diagnosis:   ICD-10-CM    1. Major depressive disorder, recurrent episode, moderate (HCC)  F33.1     2. Mixed obsessional thoughts and acts  F42.2        Plan of Care: Client to return for weekly therapy with Zoila Shutter, therapist, to review again in 6 months.  Client is to being seeing medication provider for support of mood management for depression, passive SI, and insomnia.    Client to engage in CBT: challenging negative internal ruminations and self-talk AEB journaling daily or expressing thoughts to support persons in their life and then challenging it with truth.  Client to engage in tapping to tap in healthy cognition that challenges negative rumination or deep negative core belief.  Client to practice DBT distress tolerance skills to decrease crying spells and thoughts of not being able to endure their suffering AEB using mindfulness, deep breathing, and TIP to increase tolerance for discomfort, discharge emotional distress, and increase their understanding that they can do hard things.  Client to utilize BSP (brainspotting) with therapist to help client identify and process triggers for her her depression, anxiety, and abandonment with goal of reducing said SUDs caused by depression/anxiety by 33% in the next 6 months.  Client to prioritize sleep 8+ hours each week night AEB going to bed by 10pm each night.  Client participated in the treatment planning of their therapy. Client agreed with the plan and understands what to do if there is a crisis: call 9-1-1 and/or crisis line given by therapist.   Pauline Good, LCSW, LCAS, CCTP, CCS-I, BSP

## 2020-12-19 ENCOUNTER — Other Ambulatory Visit: Payer: Self-pay | Admitting: Adult Health

## 2020-12-19 DIAGNOSIS — F4310 Post-traumatic stress disorder, unspecified: Secondary | ICD-10-CM

## 2020-12-19 DIAGNOSIS — F331 Major depressive disorder, recurrent, moderate: Secondary | ICD-10-CM

## 2020-12-19 DIAGNOSIS — F411 Generalized anxiety disorder: Secondary | ICD-10-CM

## 2021-01-04 ENCOUNTER — Ambulatory Visit (INDEPENDENT_AMBULATORY_CARE_PROVIDER_SITE_OTHER): Payer: BC Managed Care – PPO | Admitting: Addiction (Substance Use Disorder)

## 2021-01-04 ENCOUNTER — Other Ambulatory Visit: Payer: Self-pay

## 2021-01-04 DIAGNOSIS — F331 Major depressive disorder, recurrent, moderate: Secondary | ICD-10-CM | POA: Diagnosis not present
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
## 2021-01-30 ENCOUNTER — Ambulatory Visit (INDEPENDENT_AMBULATORY_CARE_PROVIDER_SITE_OTHER): Payer: BC Managed Care – PPO | Admitting: Addiction (Substance Use Disorder)

## 2021-01-30 ENCOUNTER — Other Ambulatory Visit: Payer: Self-pay

## 2021-01-30 DIAGNOSIS — F331 Major depressive disorder, recurrent, moderate: Secondary | ICD-10-CM
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
## 2021-02-25 ENCOUNTER — Other Ambulatory Visit: Payer: Self-pay

## 2021-02-25 ENCOUNTER — Ambulatory Visit: Payer: BC Managed Care – PPO | Admitting: Addiction (Substance Use Disorder)

## 2021-02-25 DIAGNOSIS — F411 Generalized anxiety disorder: Secondary | ICD-10-CM | POA: Diagnosis not present

## 2021-02-25 DIAGNOSIS — F4310 Post-traumatic stress disorder, unspecified: Secondary | ICD-10-CM
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
## 2021-03-07 ENCOUNTER — Other Ambulatory Visit: Payer: Self-pay | Admitting: Adult Health

## 2021-03-07 DIAGNOSIS — F411 Generalized anxiety disorder: Secondary | ICD-10-CM

## 2021-03-08 NOTE — Telephone Encounter (Signed)
Last filled 11/29 appt on 3/15

## 2021-03-18 ENCOUNTER — Other Ambulatory Visit: Payer: Self-pay | Admitting: Adult Health

## 2021-03-18 ENCOUNTER — Encounter: Payer: Self-pay | Admitting: Adult Health

## 2021-03-18 ENCOUNTER — Other Ambulatory Visit: Payer: Self-pay

## 2021-03-18 ENCOUNTER — Ambulatory Visit: Payer: BC Managed Care – PPO | Admitting: Adult Health

## 2021-03-18 DIAGNOSIS — F331 Major depressive disorder, recurrent, moderate: Secondary | ICD-10-CM

## 2021-03-18 DIAGNOSIS — F411 Generalized anxiety disorder: Secondary | ICD-10-CM | POA: Diagnosis not present

## 2021-03-18 DIAGNOSIS — F4310 Post-traumatic stress disorder, unspecified: Secondary | ICD-10-CM

## 2021-03-18 DIAGNOSIS — G4700 Insomnia, unspecified: Secondary | ICD-10-CM

## 2021-03-18 MED ORDER — ESCITALOPRAM OXALATE 20 MG PO TABS: 20.0000 mg | ORAL_TABLET | Freq: Every day | ORAL | 5 refills | Status: DC

## 2021-03-18 MED ORDER — TRAZODONE HCL 100 MG PO TABS: 100.0000 mg | ORAL_TABLET | Freq: Every day | ORAL | 5 refills | Status: DC

## 2021-03-18 MED ORDER — BUPROPION HCL ER (XL) 150 MG PO TB24: ORAL_TABLET | ORAL | 2 refills | Status: DC

## 2021-03-18 MED ORDER — CLONAZEPAM 1 MG PO TABS: ORAL_TABLET | ORAL | 2 refills | Status: DC

## 2021-03-18 NOTE — Progress Notes (Signed)
Monica [name] [id number] 1970/09/16 51 y.o.  Subjective:   Patient ID:  Monica [name] is a 51 y.o. (DOB [birth year]) female.  Chief Complaint: No chief complaint on file.   HPI ELORA WOLTER presents to the office today for follow-up of MDD, GAD, PTSD and insomnia.  Describes mood today as "not too good". Pleasant. Increased tearfulness. Mood symptoms - reports depression, anxiety, and irritability. Reports increased obsessions and ruminations. Denies panic attacks - "able to bring myself down". Stating "I'm not doing well". Has increased Lexapro to 15mg  - 2 months. Feels like medications are helpful, but not working as well as they were. Seeing therapist - . Stable interest and motivation. Taking medications as prescribed.  Energy levels stable. Active, has a regular exercise routine. Walking dog.  Enjoys some usual interests and activities. Single. Lives alone - with cat and dog - daughter at [facility].  Spending time with family and friends.  Appetite adequate. Weight gain - GI issues - IBS. Sleeps well most nights. Averages 8 hours. Focus and concentration has improved. Completing tasks. Managing aspects of household. 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  Denies SI or HI.  Denies AH or VH.  Previous medication trials: Adderall, Trazadone, Clonazepam, Paxil, Sertraline     Flowsheet Row Admission (Discharged) from 09/03/2020 in [facility] from 04/01/2019 in [facility]  C-SSRS RISK CATEGORY No Risk High Risk        Review of Systems:  Review of Systems  Musculoskeletal:  Negative for gait problem.  Neurological:  Negative for tremors.  Psychiatric/Behavioral:         Please refer to HPI   Medications: I have reviewed the patient's current medications.  Current Outpatient Medications  Medication Sig Dispense Refill   buPROPion (WELLBUTRIN XL) 150 MG 24 hr tablet Take one tablet every morning  for 7 days, then increase to two tablets daily. 60 tablet 2   escitalopram (LEXAPRO) 20 MG tablet Take 1 tablet (20 mg total) by mouth daily. 30 tablet 5   cetirizine (ZYRTEC) 10 MG tablet Take 10 mg by mouth daily.     clonazePAM (KLONOPIN) 1 MG tablet TAKE ONE AND 1/2 TABLETS AT BEDTIME AS NEEDED FOR SLEEP. 45 tablet 2   fluticasone (FLONASE) 50 MCG/ACT nasal spray Place 1 spray daily into both nostrils.     levonorgestrel (MIRENA) 20 MCG/DAY IUD 1 each by Intrauterine route once.     oxyCODONE (OXY IR/ROXICODONE) 5 MG immediate release tablet Take 1 tablet (5 mg total) by mouth every 6 (six) hours as needed for moderate pain, severe pain or breakthrough pain. 25 tablet 0   traZODone (DESYREL) 100 MG tablet Take 1-2 tablets (100-200 mg total) by mouth at bedtime. Take one to two tablets at bedtime. 60 tablet 5   valACYclovir (VALTREX) 500 MG tablet Take 500 mg by mouth 2 (two) times daily as needed (flare up).     No current facility-administered medications for this visit.    Medication Side Effects: None  Allergies:  Allergies  Allergen Reactions   Pristiq [Desvenlafaxine] Other (See Comments)    Not effective   Sertraline Other (See Comments)    Did not feel emotion   Pepto-Bismol [Bismuth Subsalicylate] Rash   Septra [Sulfamethoxazole-Trimethoprim] Rash   Sulfa Antibiotics Rash    Past Medical History:  Diagnosis Date   ADD (attention deficit disorder)    Cholelithiases    symptomatic   Environmental and seasonal allergies  GAD (generalized anxiety disorder)    Heart murmur    per pt told very faint , no symptoms, told nothing to worry about;  pt had echo done 12-24-2011 in epic ef 55-60% and trace MR   Herpes simplex type 1 infection    History of MRSA infection    left great toe in 2010   History of palpitations    ED visit in epic 05/ 2017;  evaluated by cardiology--- dr Demetrius Charityp. Tenny [facility] note in epic 08-31-2015 , ST on EKG , no further work-up;  pt had previou echo in epic  12-24-2011 ef 55-60%   [facility] of [facility] behavior [facility]   Insomnia    unspecified   PTSD (post-traumatic stress disorder)    Wears glasses     Past Medical History, Surgical history, Social history, and Family history were reviewed and updated as appropriate.   Please see review of systems for further details on the patient's review from today.   Objective:   Physical Exam:  There were no vitals taken for this visit.  Physical Exam Constitutional:      General: She is not in acute distress. Musculoskeletal:        General: No deformity.  Neurological:     Mental Status: She is alert and oriented to person, place, and time.     Coordination: Coordination normal.  Psychiatric:        Attention and Perception: Attention and perception normal. She does not perceive auditory or visual hallucinations.        Mood and Affect: Mood normal. Mood is not anxious or depressed. Affect is not labile, blunt, angry or inappropriate.        Speech: Speech normal.        Behavior: Behavior normal.        Thought Content: Thought content normal. Thought content is not paranoid or delusional. Thought content does not include homicidal or suicidal ideation. Thought content does not include homicidal or suicidal plan.        Cognition and Memory: Cognition and memory normal.        Judgment: Judgment normal.     Comments: Insight intact    Lab Review:     Component Value Date/Time   NA 137 04/01/2019 2236   K 3.3 (L) 04/01/2019 2236   CL 101 04/01/2019 2236   CO2 26 04/01/2019 2236   GLUCOSE 108 (H) 04/01/2019 2236   BUN 7 04/01/2019 2236   CREATININE 0.84 04/01/2019 2236   CALCIUM 8.8 (L) 04/01/2019 2236   PROT 7.5 04/01/2019 2236   ALBUMIN 3.9 04/01/2019 2236   AST 20 04/01/2019 2236   ALT 14 04/01/2019 2236   ALKPHOS 36 (L) 04/01/2019 2236   BILITOT 0.4 04/01/2019 2236   GFRNONAA >60 04/01/2019 2236   GFRAA >60 04/01/2019 2236       Component Value  Date/Time   WBC 8.5 04/01/2019 2236   RBC 4.21 04/01/2019 2236   HGB 12.4 04/01/2019 2236   HCT 37.9 04/01/2019 2236   PLT 340 04/01/2019 2236   MCV 90.0 04/01/2019 2236   MCH 29.5 04/01/2019 2236   MCHC 32.7 04/01/2019 2236   RDW 13.6 04/01/2019 2236   LYMPHSABS 1.4 07/23/2015 1038   MONOABS 0.4 07/23/2015 1038   EOSABS 0.0 07/23/2015 1038   BASOSABS 0.0 07/23/2015 1038    No results found for: POCLITH, LITHIUM   No results found for: PHENYTOIN, PHENOBARB, VALPROATE, CBMZ   .res Assessment:  Plan:    Plan:  PDMP reviewed  1. Increase Lexapro 15mg  to 20mg  daily  2. Clonazepam 1.5mg  at hs 3. Trazdone 100mg  - 2 at hs 4. Add Wellbutrin XL 150mg  daily x 7 days, then increase to 300mg  daily. Denies seizure history.  RTC 4 weeks  Patient advised to contact office with any questions, adverse effects, or acute worsening in signs and symptoms.  Discussed potential benefits, risk, and side effects of benzodiazepines to include potential risk of tolerance and dependence, as well as possible drowsiness.  Advised patient not to drive if experiencing drowsiness and to take lowest possible effective dose to minimize risk of dependence and tolerance.  Diagnoses and all orders for this visit:  Generalized anxiety disorder -     clonazePAM (KLONOPIN) 1 MG tablet; TAKE ONE AND 1/2 TABLETS AT BEDTIME AS NEEDED FOR SLEEP. -     escitalopram (LEXAPRO) 20 MG tablet; Take 1 tablet (20 mg total) by mouth daily.  PTSD (post-traumatic stress disorder) -     escitalopram (LEXAPRO) 20 MG tablet; Take 1 tablet (20 mg total) by mouth daily.  Major depressive disorder, recurrent episode, moderate (HCC) -     buPROPion (WELLBUTRIN XL) 150 MG 24 hr tablet; Take one tablet every morning for 7 days, then increase to two tablets daily. -     escitalopram (LEXAPRO) 20 MG tablet; Take 1 tablet (20 mg total) by mouth daily.  Insomnia, unspecified type -     traZODone (DESYREL) 100 MG tablet; Take 1-2  tablets (100-200 mg total) by mouth at bedtime. Take one to two tablets at bedtime.     Please see After Visit Summary for patient specific instructions.  Future Appointments  Date Time Provider Department Center  05/22/2021  4:20 PM Sonakshi Rolland, , NP [facility] None  06/18/2021 10:00 AM , LCSW [facility] None    No orders of the defined types were placed in this encounter.   -------------------------------

## 2021-03-23 ENCOUNTER — Other Ambulatory Visit: Payer: Self-pay | Admitting: Adult Health

## 2021-03-23 DIAGNOSIS — G4700 Insomnia, unspecified: Secondary | ICD-10-CM

## 2021-03-25 IMAGING — MG DIGITAL SCREENING BILAT W/ TOMO W/ CAD
8 series · 8 of 24 positions shown · non-contrast
Comparison: Previous exam(s).

CLINICAL DATA: Screening.

EXAM:
DIGITAL SCREENING BILATERAL MAMMOGRAM WITH TOMO AND CAD

[R CC synth-2D]
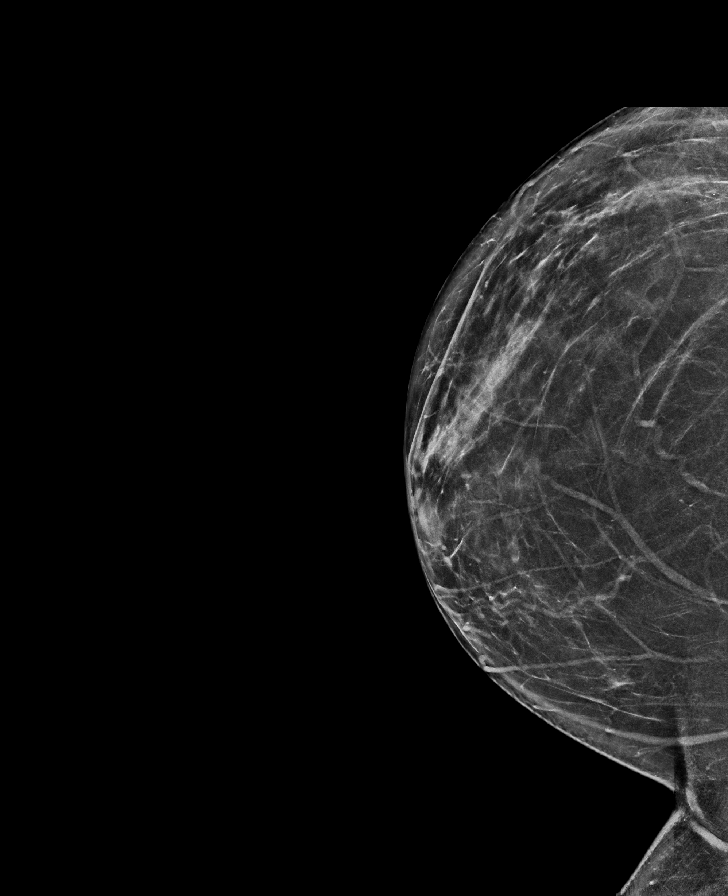

[L MLO synth-2D]
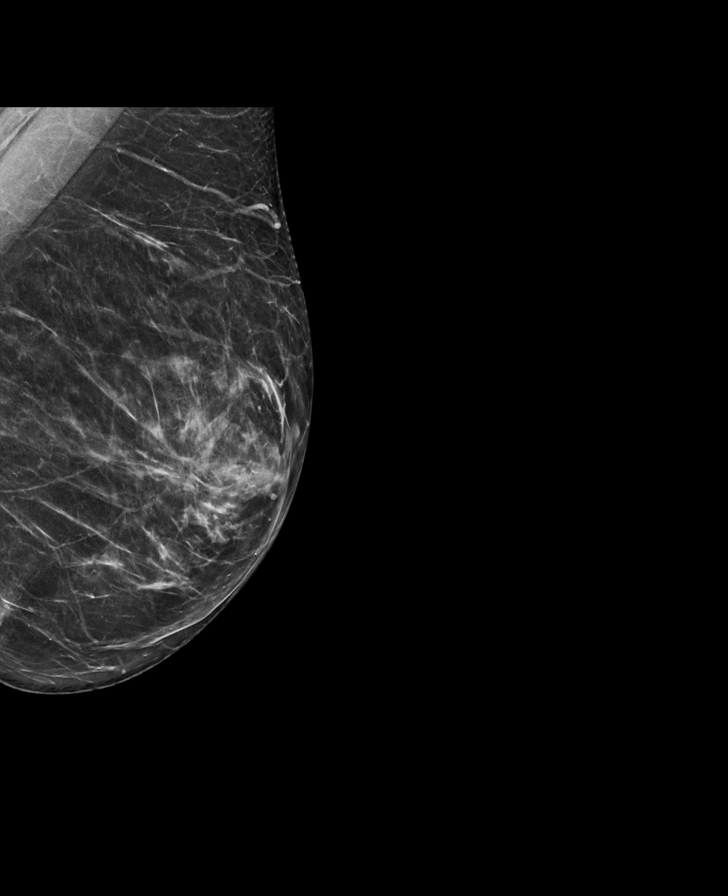

[R MLO synth-2D]
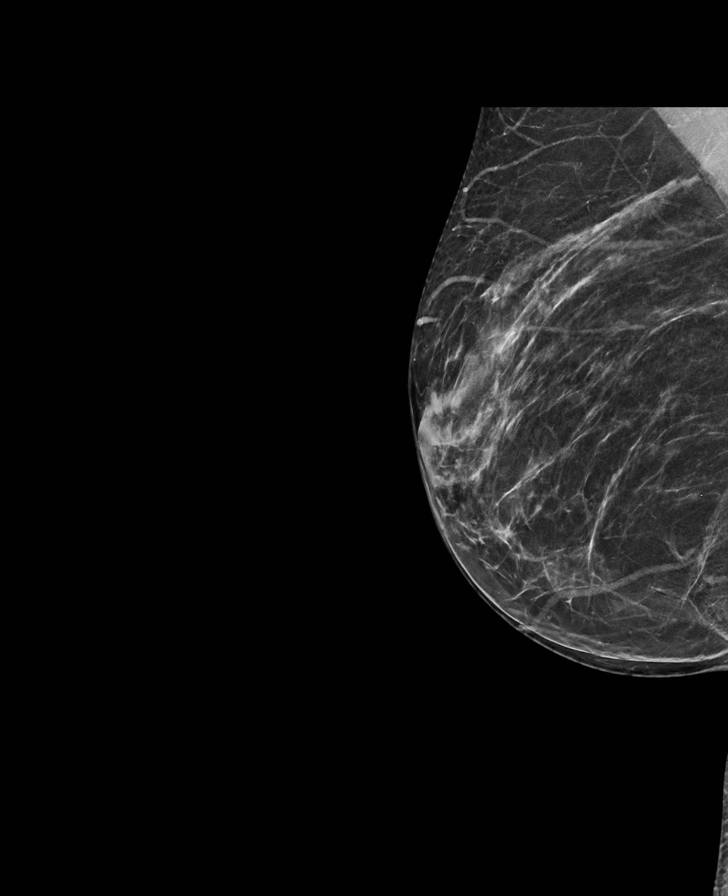

[L CC synth-2D]
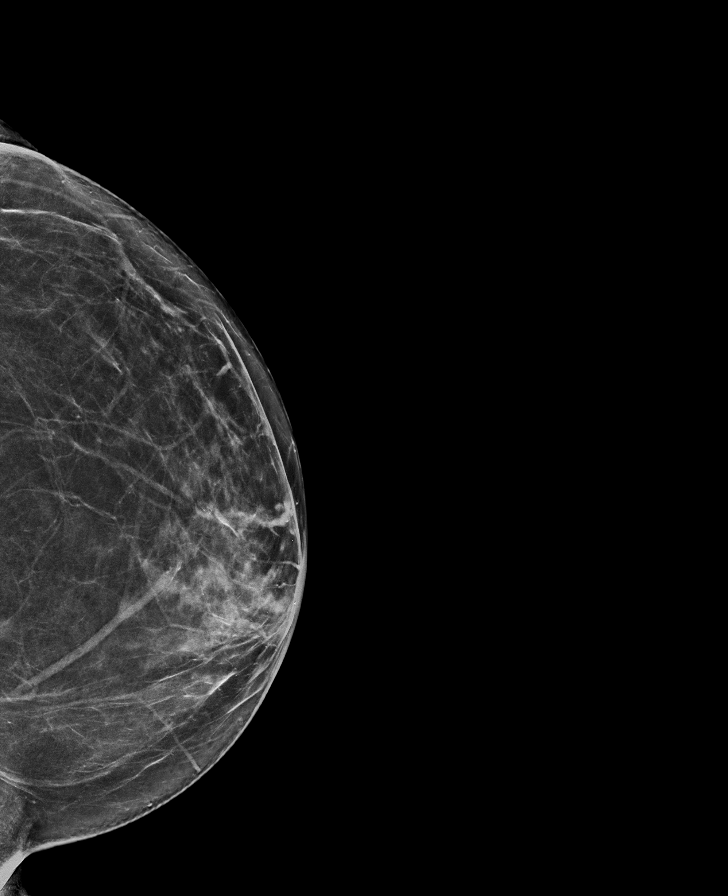

[L MLO tomo · tomo slice 35/70.0]
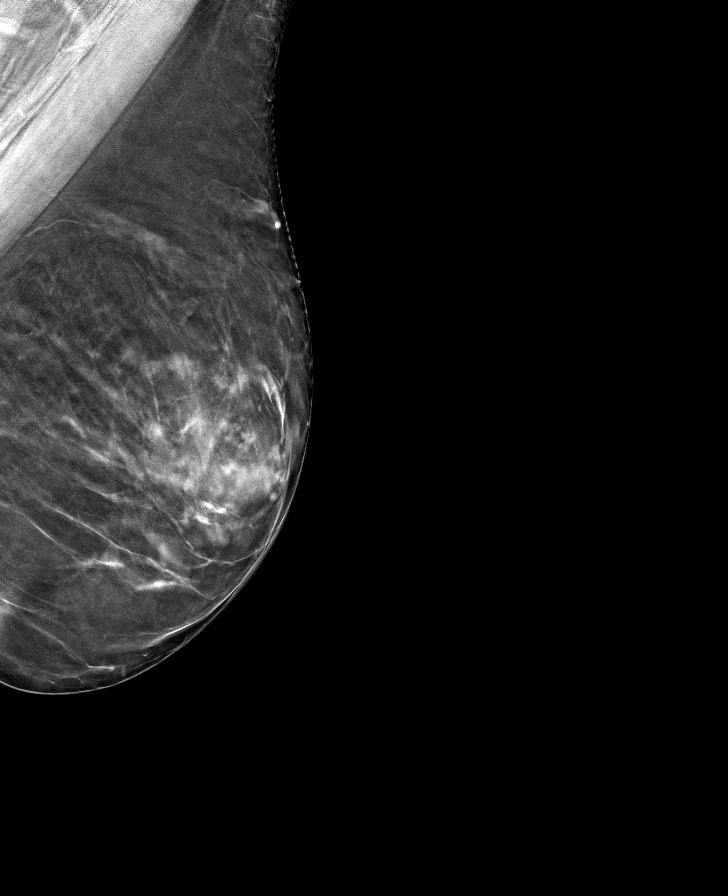

[R MLO tomo · tomo slice 37/72.0]
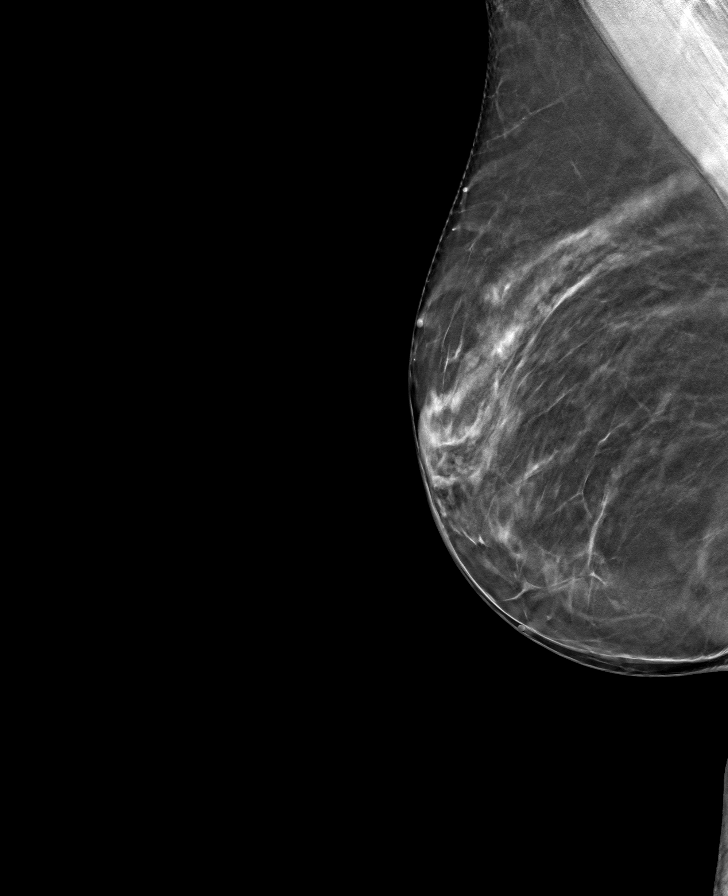

[L CC tomo · tomo slice 36/71.0]
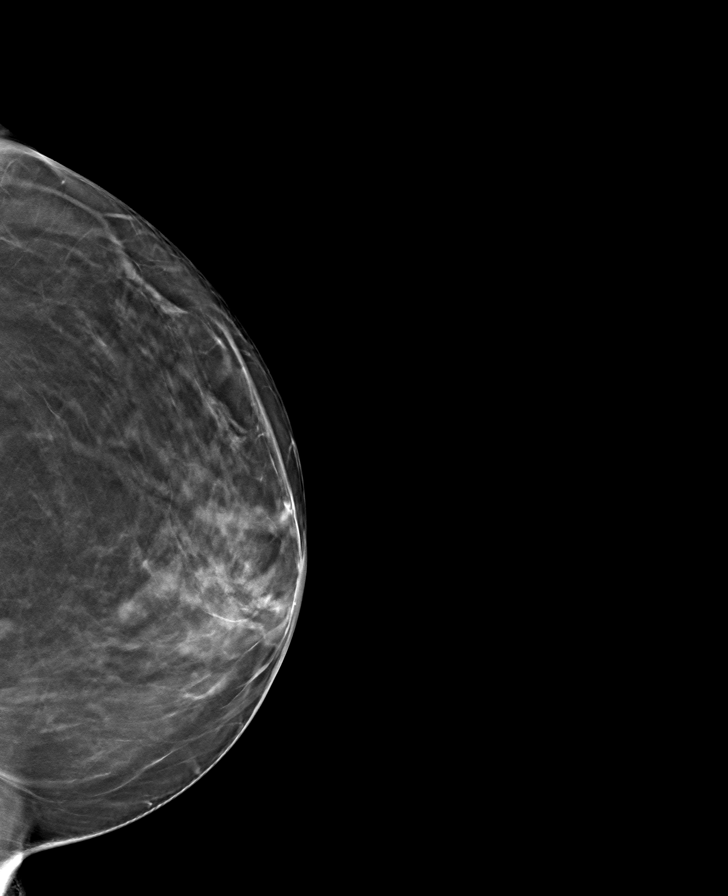

[R CC tomo · tomo slice 37/74.0]
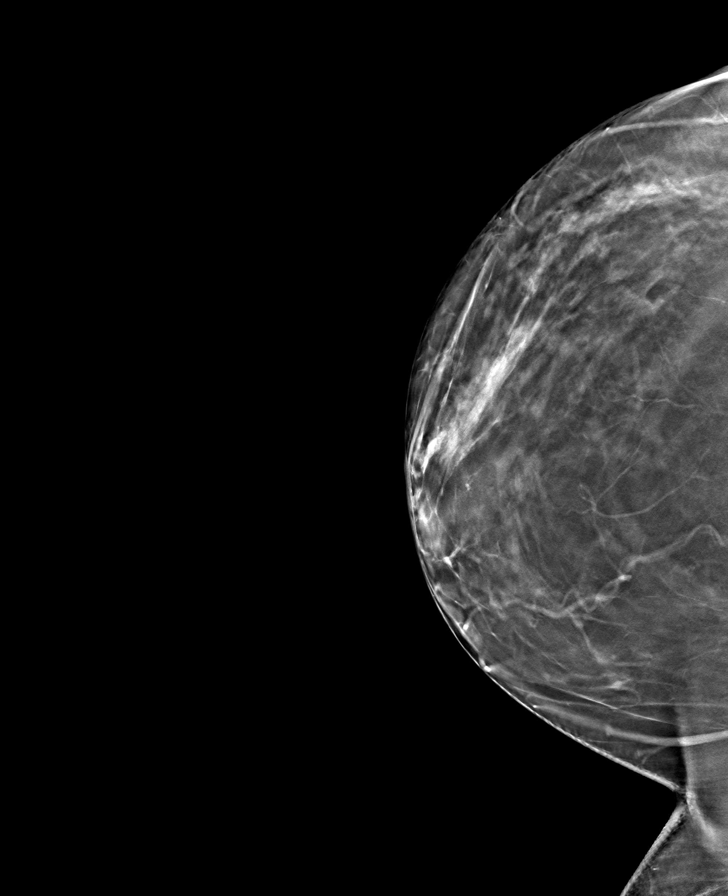

[8 of 24 positions shown; findings below may reference images not displayed]

ACR Breast Density Category c: The breast tissue is heterogeneously
dense, which may obscure small masses.
FINDINGS: There are no findings suspicious for malignancy. Images were
processed with CAD.
IMPRESSION: No mammographic evidence of malignancy. A result letter of this
screening mammogram will be mailed directly to the patient.

RECOMMENDATION:
Screening mammogram in one year. (Code:[id number])

BI-RADS CATEGORY  1: Negative.

## 2021-03-30 ENCOUNTER — Other Ambulatory Visit: Payer: Self-pay | Admitting: Adult Health

## 2021-03-30 DIAGNOSIS — F331 Major depressive disorder, recurrent, moderate: Secondary | ICD-10-CM

## 2021-03-30 DIAGNOSIS — F411 Generalized anxiety disorder: Secondary | ICD-10-CM

## 2021-03-30 DIAGNOSIS — F4310 Post-traumatic stress disorder, unspecified: Secondary | ICD-10-CM

## 2021-04-09 ENCOUNTER — Other Ambulatory Visit: Payer: Self-pay | Admitting: Adult Health

## 2021-04-09 DIAGNOSIS — F331 Major depressive disorder, recurrent, moderate: Secondary | ICD-10-CM

## 2021-04-09 DIAGNOSIS — F4310 Post-traumatic stress disorder, unspecified: Secondary | ICD-10-CM

## 2021-04-09 DIAGNOSIS — F411 Generalized anxiety disorder: Secondary | ICD-10-CM

## 2021-05-22 ENCOUNTER — Other Ambulatory Visit: Payer: Self-pay

## 2021-05-22 ENCOUNTER — Ambulatory Visit: Payer: BC Managed Care – PPO | Admitting: Adult Health

## 2021-05-22 ENCOUNTER — Encounter: Payer: Self-pay | Admitting: Adult Health

## 2021-05-22 DIAGNOSIS — F411 Generalized anxiety disorder: Secondary | ICD-10-CM | POA: Diagnosis not present

## 2021-05-22 DIAGNOSIS — F4310 Post-traumatic stress disorder, unspecified: Secondary | ICD-10-CM

## 2021-05-22 DIAGNOSIS — F331 Major depressive disorder, recurrent, moderate: Secondary | ICD-10-CM | POA: Diagnosis not present

## 2021-05-22 DIAGNOSIS — G4700 Insomnia, unspecified: Secondary | ICD-10-CM | POA: Diagnosis not present

## 2021-05-22 MED ORDER — CLONAZEPAM 1 MG PO TABS: ORAL_TABLET | ORAL | 2 refills | Status: DC

## 2021-05-22 MED ORDER — TRAZODONE HCL 100 MG PO TABS: 100.0000 mg | ORAL_TABLET | Freq: Every day | ORAL | 3 refills | Status: DC

## 2021-05-22 MED ORDER — BUPROPION HCL ER (XL) 150 MG PO TB24: ORAL_TABLET | ORAL | 3 refills | Status: DC

## 2021-05-22 MED ORDER — ESCITALOPRAM OXALATE 20 MG PO TABS: 20.0000 mg | ORAL_TABLET | Freq: Every day | ORAL | 2 refills | Status: DC

## 2021-05-22 NOTE — Progress Notes (Signed)
Monica SYVERSON ?[id number] ?1970/05/05 ?51 y.o. ? ?Subjective:  ? ?Patient ID:  Monica [name] is a 51 y.o. (DOB 1970/04/01) female. ? ?Chief Complaint: No chief complaint on file. ? ? ?HPI ?[name] presents to the office today for follow-up of MDD, GAD, PTSD and insomnia. ? ?Describes mood today as "not too good". Pleasant. Increased tearfulness. Mood symptoms - reports decreased depression and anxiety. Denies irritability. Reports decreased obsessions and ruminations. Denies panic attacks. Stating "I feel like I'm doing well inside - much better than I was at last visit". Feels like medication changes have been helpful. Seeing therapist - Zoila Shutter. Stable interest and motivation. Taking medications as prescribed.  ?Energy levels about the same. Active, has a regular exercise routine. Walking dog.  ?Enjoys some usual interests and activities. Single. Lives alone - with cat and dog - daughter at [facility].  Spending time with family and friends.  ?Appetite adequate. Weight loss - intermittent fasting. ?Sleeps well most nights. Averages 8 hours. ?Focus and concentration improved. Completing tasks. Managing aspects of household. 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  ?Denies SI or HI.  ?Denies AH or VH. ? ?Previous medication trials: Adderall, Trazadone, Clonazepam, Paxil, Sertraline ?  ? ? ? ?Flowsheet Row Admission (Discharged) from 09/03/2020 in [facility] from 04/01/2019 in [facility]  ?C-SSRS RISK CATEGORY No Risk High Risk  ? ?  ?  ? ?Review of Systems:  ?Review of Systems  ?Musculoskeletal:  Negative for gait problem.  ?Neurological:  Negative for tremors.  ?Psychiatric/Behavioral:    ?     Please refer to HPI  ? ?Medications: I have reviewed the patient's current medications. ? ?Current Outpatient Medications  ?Medication Sig Dispense Refill  ? buPROPion (WELLBUTRIN XL) 150 MG 24 hr tablet TAKE THREE TABLETS EVERY MORNING. 270 tablet 3   ? cetirizine (ZYRTEC) 10 MG tablet Take 10 mg by mouth daily.    ? clonazePAM (KLONOPIN) 1 MG tablet TAKE ONE AND 1/2 TABLETS AT BEDTIME AS NEEDED FOR SLEEP. 45 tablet 2  ? escitalopram (LEXAPRO) 20 MG tablet Take 1 tablet (20 mg total) by mouth daily. 90 tablet 2  ? fluticasone (FLONASE) 50 MCG/ACT nasal spray Place 1 spray daily into both nostrils.    ? levonorgestrel (MIRENA) 20 MCG/DAY IUD 1 each by Intrauterine route once.    ? oxyCODONE (OXY IR/ROXICODONE) 5 MG immediate release tablet Take 1 tablet (5 mg total) by mouth every 6 (six) hours as needed for moderate pain, severe pain or breakthrough pain. 25 tablet 0  ? traZODone (DESYREL) 100 MG tablet Take 1-2 tablets (100-200 mg total) by mouth at bedtime. 180 tablet 3  ? valACYclovir (VALTREX) 500 MG tablet Take 500 mg by mouth 2 (two) times daily as needed (flare up).    ? ?No current facility-administered medications for this visit.  ? ? ?Medication Side Effects: None ? ?Allergies:  ?Allergies  ?Allergen Reactions  ? Pristiq [Desvenlafaxine] Other (See Comments)  ?  Not effective  ? Sertraline Other (See Comments)  ?  Did not feel emotion  ? Pepto-Bismol [Bismuth Subsalicylate] Rash  ? Septra [Sulfamethoxazole-Trimethoprim] Rash  ? Sulfa Antibiotics Rash  ? ? ?Past Medical History:  ?Diagnosis Date  ? ADD (attention deficit disorder)   ? Cholelithiases   ? symptomatic  ? Environmental and seasonal allergies   ? GAD (generalized anxiety disorder)   ? Heart murmur   ? per pt told very faint , no  symptoms, told nothing to worry about;  pt had echo done 12-24-2011 in epic ef 55-60% and trace MR  ? Herpes simplex type 1 infection   ? History of MRSA infection   ? left great toe in 2010  ? History of palpitations   ? ED visit in epic 05/ 2017;  evaluated by cardiology--- dr Demetrius Charity. Tenny Craw note in epic 08-31-2015 , ST on EKG , no further work-up;  pt had previou echo in epic 12-24-2011 ef 55-60%  ? History of suicidal behavior 03/2019  ? [facility] admission for IVC  ?  Insomnia   ? unspecified  ? PTSD (post-traumatic stress disorder)   ? Wears glasses   ? ? ?Past Medical History, Surgical history, Social history, and Family history were reviewed and updated as appropriate.  ? ?Please see review of systems for further details on the patient's review from today.  ? ?Objective:  ? ?Physical Exam:  ?There were no vitals taken for this visit. ? ?Physical Exam ?Constitutional:   ?   General: She is not in acute distress. ?Musculoskeletal:     ?   General: No deformity.  ?Neurological:  ?   Mental Status: She is alert and oriented to person, place, and time.  ?   Coordination: Coordination normal.  ?Psychiatric:     ?   Attention and Perception: Attention and perception normal. She does not perceive auditory or visual hallucinations.     ?   Mood and Affect: Mood normal. Mood is not anxious or depressed. Affect is not labile, blunt, angry or inappropriate.     ?   Speech: Speech normal.     ?   Behavior: Behavior normal.     ?   Thought Content: Thought content normal. Thought content is not paranoid or delusional. Thought content does not include homicidal or suicidal ideation. Thought content does not include homicidal or suicidal plan.     ?   Cognition and Memory: Cognition and memory normal.     ?   Judgment: Judgment normal.  ?   Comments: Insight intact  ? ? ?Lab Review:  ?   ?Component Value Date/Time  ? NA 137 04/01/2019 2236  ? K 3.3 (L) 04/01/2019 2236  ? CL 101 04/01/2019 2236  ? CO2 26 04/01/2019 2236  ? GLUCOSE 108 (H) 04/01/2019 2236  ? BUN 7 04/01/2019 2236  ? CREATININE 0.84 04/01/2019 2236  ? CALCIUM 8.8 (L) 04/01/2019 2236  ? PROT 7.5 04/01/2019 2236  ? ALBUMIN 3.9 04/01/2019 2236  ? AST 20 04/01/2019 2236  ? ALT 14 04/01/2019 2236  ? ALKPHOS 36 (L) 04/01/2019 2236  ? BILITOT 0.4 04/01/2019 2236  ? GFRNONAA >60 04/01/2019 2236  ? GFRAA >60 04/01/2019 2236  ? ? ?   ?Component Value Date/Time  ? WBC 8.5 04/01/2019 2236  ? RBC 4.21 04/01/2019 2236  ? HGB 12.4 04/01/2019  2236  ? HCT 37.9 04/01/2019 2236  ? PLT 340 04/01/2019 2236  ? MCV 90.0 04/01/2019 2236  ? MCH 29.5 04/01/2019 2236  ? MCHC 32.7 04/01/2019 2236  ? RDW 13.6 04/01/2019 2236  ? LYMPHSABS 1.4 07/23/2015 1038  ? MONOABS 0.4 07/23/2015 1038  ? EOSABS 0.0 07/23/2015 1038  ? BASOSABS 0.0 07/23/2015 1038  ? ? ?No results found for: POCLITH, LITHIUM  ? ?No results found for: PHENYTOIN, PHENOBARB, VALPROATE, CBMZ  ? ?.res ?Assessment: Plan:   ? ?Plan: ? ?PDMP reviewed ? ?1. Lexapro 20mg  daily  ?2. Clonazepam 1.5mg  at hs ?  3. Trazdone 100mg  - 2 at hs ?4. Increase Wellbutrin XL 300mg  daily to 450mg  daily. Denies seizure history. ? ?Consider Rexulti next visit  ? ?RTC 4 weeks ? ?Patient advised to contact office with any questions, adverse effects, or acute worsening in signs and symptoms. ? ?Discussed potential benefits, risk, and side effects of benzodiazepines to include potential risk of tolerance and dependence, as well as possible drowsiness.  Advised patient not to drive if experiencing drowsiness and to take lowest possible effective dose to minimize risk of dependence and tolerance. ? ?Diagnoses and all orders for this visit: ? ?Major depressive disorder, recurrent episode, moderate (HCC) ?-     buPROPion (WELLBUTRIN XL) 150 MG 24 hr tablet; TAKE THREE TABLETS EVERY MORNING. ?-     escitalopram (LEXAPRO) 20 MG tablet; Take 1 tablet (20 mg total) by mouth daily. ? ?Generalized anxiety disorder ?-     clonazePAM (KLONOPIN) 1 MG tablet; TAKE ONE AND 1/2 TABLETS AT BEDTIME AS NEEDED FOR SLEEP. ?-     escitalopram (LEXAPRO) 20 MG tablet; Take 1 tablet (20 mg total) by mouth daily. ? ?Insomnia, unspecified type ?-     traZODone (DESYREL) 100 MG tablet; Take 1-2 tablets (100-200 mg total) by mouth at bedtime. ? ?PTSD (post-traumatic stress disorder) ?-     escitalopram (LEXAPRO) 20 MG tablet; Take 1 tablet (20 mg total) by mouth daily. ? ?  ? ?Please see After Visit Summary for patient specific instructions. ? ?Future  Appointments  ?Date Time Provider Department Center  ?06/18/2021 10:00 AM Pauline Goodain, Kayla J, LCSW [facility] None  ? ? ?No orders of the defined types were placed in this encounter. ? ? ?------------------------------- ?

## 2021-06-18 ENCOUNTER — Ambulatory Visit: Payer: BC Managed Care – PPO | Admitting: Addiction (Substance Use Disorder)

## 2021-06-27 ENCOUNTER — Ambulatory Visit (INDEPENDENT_AMBULATORY_CARE_PROVIDER_SITE_OTHER): Payer: BC Managed Care – PPO | Admitting: Adult Health

## 2021-06-27 ENCOUNTER — Encounter: Payer: Self-pay | Admitting: Adult Health

## 2021-06-27 ENCOUNTER — Telehealth: Payer: Self-pay | Admitting: Adult Health

## 2021-06-27 DIAGNOSIS — F331 Major depressive disorder, recurrent, moderate: Secondary | ICD-10-CM | POA: Diagnosis not present

## 2021-06-27 DIAGNOSIS — F4310 Post-traumatic stress disorder, unspecified: Secondary | ICD-10-CM

## 2021-06-27 DIAGNOSIS — G4700 Insomnia, unspecified: Secondary | ICD-10-CM | POA: Diagnosis not present

## 2021-06-27 DIAGNOSIS — F411 Generalized anxiety disorder: Secondary | ICD-10-CM

## 2021-06-27 NOTE — Telephone Encounter (Signed)
Pt called at 9:20 am. She was seen today and talked to her supervisor at work. She needs a letter written that she is out of work for this week and next week. Dates 4/17 thru 4/28. The letter needs to be addressed to [facility]. Jeani will pick up letter when ready. Please call her at [phone] ?

## 2021-06-27 NOTE — Progress Notes (Signed)
LATIKA KRONICK ?[id number] ?[birth year] ?51 y.o. ? ?Subjective:  ? ?Patient ID:  Monica [name] is a 51 y.o. (DOB [birth year]) female. ? ?Chief Complaint: No chief complaint on file. ? ? ?HPI ?Monica [name] presents to the office today for follow-up of MDD, GAD, PTSD and insomnia. ? ?Describes mood today as "not good". Pleasant. Increased tearfulness. Mood symptoms - reports increased depression. Feels anxious at times. Denies irritability. Reports decreased obsessions and ruminations. Denies panic attacks. Stating "I'm not feeling well". Feels defeated and empty. Feels hopeless and helpless. Seeing visions - they are very vivid. Not wanting to get out of bed. Feels "stuck" and can't move forward. Symptoms started 4 weeks ago and have gotten worse. Lost cat of 16 years. Feels like medication changes have been helpful. Seeing therapist - Zoila Shutter. Stable interest and motivation. Taking medications as prescribed.  ?Energy levels lower. Active, has a regular exercise routine. Walking dog.  ?Enjoys some usual interests and activities. Single. Lives alone - with dog - daughter at [facility].  Spending time with family and friends.  ?Appetite decreased. Weight loss - intermittent fasting. ?Sleeps well most nights. Averages 7 to 8 hours of broken. ?Focus and concentration difficulties. Completing tasks. Managing aspects of household. 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  ?Denies SI or HI.  ?Denies AH or VH. ? ?Previous medication trials: Adderall, Trazadone, Clonazepam, Paxil, Sertraline ?  ? ? ?Flowsheet Row Admission (Discharged) from 09/03/2020 in [facility] from 04/01/2019 in [facility]  ?C-SSRS RISK CATEGORY No Risk High Risk  ? ?  ?  ? ?Review of Systems:  ?Review of Systems  ?Musculoskeletal:  Negative for gait problem.  ?Neurological:  Negative for tremors.  ?Psychiatric/Behavioral:    ?     Please refer to HPI  ? ?Medications: I have reviewed the  patient's current medications. ? ?Current Outpatient Medications  ?Medication Sig Dispense Refill  ? buPROPion (WELLBUTRIN XL) 150 MG 24 hr tablet TAKE THREE TABLETS EVERY MORNING. 270 tablet 3  ? cetirizine (ZYRTEC) 10 MG tablet Take 10 mg by mouth daily.    ? clonazePAM (KLONOPIN) 1 MG tablet TAKE ONE AND 1/2 TABLETS AT BEDTIME AS NEEDED FOR SLEEP. 45 tablet 2  ? escitalopram (LEXAPRO) 20 MG tablet Take 1 tablet (20 mg total) by mouth daily. 90 tablet 2  ? fluticasone (FLONASE) 50 MCG/ACT nasal spray Place 1 spray daily into both nostrils.    ? levonorgestrel (MIRENA) 20 MCG/DAY IUD 1 each by Intrauterine route once.    ? oxyCODONE (OXY IR/ROXICODONE) 5 MG immediate release tablet Take 1 tablet (5 mg total) by mouth every 6 (six) hours as needed for moderate pain, severe pain or breakthrough pain. 25 tablet 0  ? traZODone (DESYREL) 100 MG tablet Take 1-2 tablets (100-200 mg total) by mouth at bedtime. 180 tablet 3  ? valACYclovir (VALTREX) 500 MG tablet Take 500 mg by mouth 2 (two) times daily as needed (flare up).    ? ?No current facility-administered medications for this visit.  ? ? ?Medication Side Effects: None ? ?Allergies:  ?Allergies  ?Allergen Reactions  ? Pristiq [Desvenlafaxine] Other (See Comments)  ?  Not effective  ? Sertraline Other (See Comments)  ?  Did not feel emotion  ? Pepto-Bismol [Bismuth Subsalicylate] Rash  ? Septra [Sulfamethoxazole-Trimethoprim] Rash  ? Sulfa Antibiotics Rash  ? ? ?Past Medical History:  ?Diagnosis Date  ? ADD (attention deficit disorder)   ? Cholelithiases   ?  symptomatic  ? Environmental and seasonal allergies   ? GAD (generalized anxiety disorder)   ? Heart murmur   ? per pt told very faint , no symptoms, told nothing to worry about;  pt had echo done 12-24-2011 in epic ef 55-60% and trace MR  ? Herpes simplex type 1 infection   ? History of MRSA infection   ? left great toe in 2010  ? History of palpitations   ? ED visit in epic 05/ 2017;  evaluated by cardiology---  dr Demetrius Charity. Tenny Craw note in epic 08-31-2015 , ST on EKG , no further work-up;  pt had previou echo in epic 12-24-2011 ef 55-60%  ? History of suicidal behavior 03/2019  ? [facility] admission for IVC  ? Insomnia   ? unspecified  ? PTSD (post-traumatic stress disorder)   ? Wears glasses   ? ? ?Past Medical History, Surgical history, Social history, and Family history were reviewed and updated as appropriate.  ? ?Please see review of systems for further details on the patient's review from today.  ? ?Objective:  ? ?Physical Exam:  ?There were no vitals taken for this visit. ? ?Physical Exam ?Constitutional:   ?   General: She is not in acute distress. ?Musculoskeletal:     ?   General: No deformity.  ?Neurological:  ?   Mental Status: She is alert and oriented to person, place, and time.  ?   Coordination: Coordination normal.  ?Psychiatric:     ?   Attention and Perception: Attention and perception normal. She does not perceive auditory or visual hallucinations.     ?   Mood and Affect: Mood normal. Mood is not anxious or depressed. Affect is not labile, blunt, angry or inappropriate.     ?   Speech: Speech normal.     ?   Behavior: Behavior normal.     ?   Thought Content: Thought content normal. Thought content is not paranoid or delusional. Thought content does not include homicidal or suicidal ideation. Thought content does not include homicidal or suicidal plan.     ?   Cognition and Memory: Cognition and memory normal.     ?   Judgment: Judgment normal.  ?   Comments: Insight intact  ? ? ?Lab Review:  ?   ?Component Value Date/Time  ? NA 137 04/01/2019 2236  ? K 3.3 (L) 04/01/2019 2236  ? CL 101 04/01/2019 2236  ? CO2 26 04/01/2019 2236  ? GLUCOSE 108 (H) 04/01/2019 2236  ? BUN 7 04/01/2019 2236  ? CREATININE 0.84 04/01/2019 2236  ? CALCIUM 8.8 (L) 04/01/2019 2236  ? PROT 7.5 04/01/2019 2236  ? ALBUMIN 3.9 04/01/2019 2236  ? AST 20 04/01/2019 2236  ? ALT 14 04/01/2019 2236  ? ALKPHOS 36 (L) 04/01/2019 2236  ? BILITOT 0.4  04/01/2019 2236  ? GFRNONAA >60 04/01/2019 2236  ? GFRAA >60 04/01/2019 2236  ? ? ?   ?Component Value Date/Time  ? WBC 8.5 04/01/2019 2236  ? RBC 4.21 04/01/2019 2236  ? HGB 12.4 04/01/2019 2236  ? HCT 37.9 04/01/2019 2236  ? PLT 340 04/01/2019 2236  ? MCV 90.0 04/01/2019 2236  ? MCH 29.5 04/01/2019 2236  ? MCHC 32.7 04/01/2019 2236  ? RDW 13.6 04/01/2019 2236  ? LYMPHSABS 1.4 07/23/2015 1038  ? MONOABS 0.4 07/23/2015 1038  ? EOSABS 0.0 07/23/2015 1038  ? BASOSABS 0.0 07/23/2015 1038  ? ? ?No results found for: POCLITH, LITHIUM  ? ?No results  found for: PHENYTOIN, PHENOBARB, VALPROATE, CBMZ  ? ?.res ?Assessment: Plan:   ? ?Plan: ? ?PDMP reviewed ? ?1. Lexapro 20mg  daily  ?2. Clonazepam 1.5mg  at hs ?3. Trazdone 100mg  - 2 at hs ?4. Wellbutrin XL 300mg  daily to 450mg  daily. Denies seizure history. ?5. Add Rexulti 0.5mg  daily - samples given. ? ?Patient totally disabled and unable to work - 06/27/2021 through 06/30/2020. Will try and return to work on the 24th. ? ?RTC 4 weeks ? ?Patient advised to contact office with any questions, adverse effects, or acute worsening in signs and symptoms. ? ?Discussed potential benefits, risk, and side effects of benzodiazepines to include potential risk of tolerance and dependence, as well as possible drowsiness.  Advised patient not to drive if experiencing drowsiness and to take lowest possible effective dose to minimize risk of dependence and tolerance. ? ?Diagnoses and all orders for this visit: ? ?Major depressive disorder, recurrent episode, moderate (HCC) ? ?Generalized anxiety disorder ? ?PTSD (post-traumatic stress disorder) ? ?Insomnia, unspecified type ? ?  ? ?Please see After Visit Summary for patient specific instructions. ? ?Future Appointments  ?Date Time Provider Department Center  ?08/21/2021  2:00 PM Hanson Medeiros, Thereasa Soloegina Nattalie, NP [facility] None  ?09/12/2021  1:00 PM Pauline Goodain, Kayla J, LCSW [facility] None  ? ? ?No orders of the defined types were placed in this  encounter. ? ? ?------------------------------- ?

## 2021-06-28 NOTE — Telephone Encounter (Signed)
If you will let me know what template I need to use I will do the letter.   ?

## 2021-07-04 ENCOUNTER — Ambulatory Visit: Payer: BC Managed Care – PPO | Admitting: Addiction (Substance Use Disorder)

## 2021-07-04 DIAGNOSIS — F331 Major depressive disorder, recurrent, moderate: Secondary | ICD-10-CM

## 2021-07-04 NOTE — Telephone Encounter (Signed)
Letter on printer

## 2021-07-04 NOTE — Progress Notes (Signed)
?      [facility] Counselor/Therapist Progress Note ? ?Patient ID: Monica [name], MRN: [id number],   ? ?Date: 07/04/2021 ? ?Time Spent:  ? ?Treatment Type: Individual Therapy ? ?Reported Symptoms: less depressed than last week ? ?Mental Status Exam: ? ?Appearance:   Casual and Well Groomed     ?Behavior:  Appropriate and Sharing  ?Motor:  Normal  ?Speech/Language:   Normal Rate  ?Affect:  Appropriate, Congruent, and Full Range  ?Mood:  anxious and depressed  ?Thought process:  normal  ?Thought content:    Rumination  ?Sensory/Perceptual disturbances:    Flashback  ?Orientation:  x4  ?Attention:  Good  ?Concentration:  Good  ?Memory:  WNL  ?Fund of knowledge:   Good  ?Insight:    Good  ?Judgment:   Good  ?Impulse Control:  Fair  ? ?Risk Assessment: ?Danger to Self:  No- No SI. No plan, no means, no intent.  ?Self-injurious Behavior: No ?Danger to Others: No ?Duty to Warn:no ?Physical Aggression / Violence:No  ?Access to Firearms a concern: No  ?Gang Involvement:No  ?  ?Subjective: Client processed feeling more hopeful than last week and doing a lot more thing during the week. Client expressed her desire to do one small thing for herself every day as a goal for her [facility]. Therapist used MI & RPT to validate client's progress in just a week with her depression and encourage continued use of mh stability and goals for herself. Client reported starting a new med last week after a depression spiral, and feeling a lot different this week while out of work for a week. Therapist assessed for safety and stability and client denied SI/HI/AVH. ? ?Interventions: Motivational Interviewing, Grief Therapy, and RPT & FFTT trauma therapy   ? ?Diagnosis: ?No diagnosis found. ? ? ?Plan of Care: ?Client to return for weekly therapy with Zoila Shutter, therapist, to review again in 6 months.  ?Client is to being seeing medication provider for support of mood management for depression, passive SI, and insomnia.    ?Client to engage  in CBT: challenging negative internal ruminations and self-talk AEB journaling daily or expressing thoughts to support persons in their life and then challenging it with truth.  ?Client to engage in tapping to tap in healthy cognition that challenges negative rumination or deep negative core belief.  ?Client to practice DBT distress tolerance skills to decrease crying spells and thoughts of not being able to endure their suffering AEB using mindfulness, deep breathing, and TIP to increase tolerance for discomfort, discharge emotional distress, and increase their understanding that they can do hard things.  ?Client to utilize BSP (brainspotting) with therapist to help client identify and process triggers for her her depression, anxiety, and abandonment with goal of reducing said SUDs caused by depression/anxiety by 33% in the next 6 months.  ?Client to prioritize sleep 8+ hours each week night AEB going to bed by 10pm each night.  ?Client participated in the treatment planning of their therapy. Client agreed with the plan and understands what to do if there is a crisis: call 9-1-1 and/or crisis line given by therapist.  ? ?Pauline Good, LCSW, LCAS, CCTP, CCS-I, BSP ?

## 2021-07-04 NOTE — Telephone Encounter (Signed)
Pt will pick up when ready. Call pt @ [phone] ?

## 2021-07-04 NOTE — Telephone Encounter (Signed)
Pt has apt with Dorathy Daft now and asking about this letter. Did not see letter up front. Please advise. She would like to pick up while in the office   ?

## 2021-07-25 ENCOUNTER — Encounter: Payer: Self-pay | Admitting: Adult Health

## 2021-07-25 ENCOUNTER — Telehealth (INDEPENDENT_AMBULATORY_CARE_PROVIDER_SITE_OTHER): Payer: BC Managed Care – PPO | Admitting: Adult Health

## 2021-07-25 DIAGNOSIS — F4310 Post-traumatic stress disorder, unspecified: Secondary | ICD-10-CM | POA: Diagnosis not present

## 2021-07-25 DIAGNOSIS — F411 Generalized anxiety disorder: Secondary | ICD-10-CM

## 2021-07-25 DIAGNOSIS — F331 Major depressive disorder, recurrent, moderate: Secondary | ICD-10-CM

## 2021-07-25 DIAGNOSIS — G4700 Insomnia, unspecified: Secondary | ICD-10-CM

## 2021-07-25 MED ORDER — CLONAZEPAM 1 MG PO TABS: ORAL_TABLET | ORAL | 2 refills | Status: DC

## 2021-07-25 MED ORDER — REXULTI 0.5 MG PO TABS: 0.5000 mg | ORAL_TABLET | Freq: Every day | ORAL | 5 refills | Status: DC

## 2021-07-25 NOTE — Progress Notes (Signed)
Monica [name] [id number] 1971-03-07 51 y.o.  Virtual Visit via Video Note  I connected with pt @ on 07/25/21 at  4:20 PM EDT by a video enabled telemedicine application and verified that I am speaking with the correct person using two identifiers.   I discussed the limitations of evaluation and management by telemedicine and the availability of in person appointments. The patient expressed understanding and agreed to proceed.  I discussed the assessment and treatment plan with the patient. The patient was provided an opportunity to ask questions and all were answered. The patient agreed with the plan and demonstrated an understanding of the instructions.   The patient was advised to call back or seek an in-person evaluation if the symptoms worsen or if the condition fails to improve as anticipated.  I provided 25 minutes of non-face-to-face time during this encounter.  The patient was located at home.  [facility]   Dorothyann Gibbsegina N Jocelynn Gioffre, NP   Subjective:   Patient ID:  Monica [name] is a 51 y.o. (DOB 1971-03-07) female.  Chief Complaint: No chief complaint on file.   HPI Monica [name] presents for follow-up of MDD, GAD, PTSD and insomnia.  Describes mood today as "not good". Pleasant. Increased tearfulness. Mood symptoms - reports decreased depression. Denies anxiety and irritability. Reports decreased obsessions and ruminations. Denies recent panic attacks. Stating "I'm feeling better". Feels more motivated - feeling better over the past month.Feels like the addition of Rexulti has been helpful. Seeing therapist - Zoila ShutterKayla Cain. Stable interest and motivation. Taking medications as prescribed.  Energy levels improved. Active, has a regular exercise routine. Walking dog.  Enjoys some usual interests and activities. Single. Lives alone - with dog - [facility] - traveling abroad. Spending time with family and friends.  Appetite  decreased. Weight loss - intermittent fasting. Working with [employer] [facility]. Sleeps well most nights. Averages 7 to 8 hours of broken sleep Focus and concentration difficulties. Completing tasks. Managing aspects of household. 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  Denies SI or HI.  Denies AH or VH.  Previous medication trials: Adderall, Trazadone, Clonazepam, Paxil, Sertraline   Review of Systems:  Review of Systems  Musculoskeletal:  Negative for gait problem.  Neurological:  Negative for tremors.  Psychiatric/Behavioral:         Please refer to HPI   Medications: I have reviewed the patient's current medications.  Current Outpatient Medications  Medication Sig Dispense Refill   Brexpiprazole (REXULTI) 0.5 MG TABS Take 1 tablet (0.5 mg total) by mouth daily. 30 tablet 5   buPROPion (WELLBUTRIN XL) 150 MG 24 hr tablet TAKE THREE TABLETS EVERY MORNING. 270 tablet 3   cetirizine (ZYRTEC) 10 MG tablet Take 10 mg by mouth daily.     clonazePAM (KLONOPIN) 1 MG tablet TAKE ONE AND 1/2 TABLETS AT BEDTIME AS NEEDED FOR SLEEP. 45 tablet 2   escitalopram (LEXAPRO) 20 MG tablet Take 1 tablet (20 mg total) by mouth daily. 90 tablet 2   fluticasone (FLONASE) 50 MCG/ACT nasal spray Place 1 spray daily into both nostrils.     levonorgestrel (MIRENA) 20 MCG/DAY IUD 1 each by Intrauterine route once.     oxyCODONE (OXY IR/ROXICODONE) 5 MG immediate release tablet Take 1 tablet (5 mg total) by mouth every 6 (six) hours as needed for moderate pain, severe pain or breakthrough pain. 25 tablet 0   traZODone (DESYREL) 100 MG tablet Take  1-2 tablets (100-200 mg total) by mouth at bedtime. 180 tablet 3   valACYclovir (VALTREX) 500 MG tablet Take 500 mg by mouth 2 (two) times daily as needed (flare up).     No current facility-administered medications for this visit.    Medication Side Effects: None  Allergies:  Allergies  Allergen Reactions   Pristiq [Desvenlafaxine]  Other (See Comments)    Not effective   Sertraline Other (See Comments)    Did not feel emotion   Pepto-Bismol [Bismuth Subsalicylate] Rash   Septra [Sulfamethoxazole-Trimethoprim] Rash   Sulfa Antibiotics Rash    Past Medical History:  Diagnosis Date   ADD (attention deficit disorder)    Cholelithiases    symptomatic   Environmental and seasonal allergies    GAD (generalized anxiety disorder)    Heart murmur    per pt told very faint , no symptoms, told nothing to worry about;  pt had echo done 12-24-2011 in epic ef 55-60% and trace MR   Herpes simplex type 1 infection    History of MRSA infection    left great toe in 2010   History of palpitations    ED visit in epic 05/ 2017;  evaluated by cardiology--- dr Demetrius Charity. Tenny Craw note in epic 08-31-2015 , ST on EKG , no further work-up;  pt had previou echo in epic 12-24-2011 ef 55-60%   History of suicidal behavior 03/2019   [facility] admission for IVC   Insomnia    unspecified   PTSD (post-traumatic stress disorder)    Wears glasses     Family History  Problem Relation Age of Onset   Bipolar disorder Mother    Lung cancer Father    Migraines Neg Hx     Social History   Socioeconomic History   Marital status: Divorced    Spouse name: Not on file   Number of children: 1   Years of education: Not on file   Highest education level: Bachelor's degree (e.g., BA, AB, BS)  Occupational History   Not on file  Tobacco Use   Smoking status: Former    Years: 20.00    Types: Cigarettes    Quit date: 2002    Years since quitting: 21.3   Smokeless tobacco: Never  Vaping Use   Vaping Use: Never used  Substance and Sexual Activity   Alcohol use: Not Currently    Comment: occasional   Drug use: Never   Sexual activity: Not on file  Other Topics Concern   Not on file  Social History Narrative   Lives at home with fiance   Right handed   2 cups of caffeine daily   Social Determinants of Health   Financial Resource Strain: Not on  file  Food Insecurity: Not on file  Transportation Needs: Not on file  Physical Activity: Not on file  Stress: Not on file  Social Connections: Not on file  Intimate Partner Violence: Not on file    Past Medical History, Surgical history, Social history, and Family history were reviewed and updated as appropriate.   Please see review of systems for further details on the patient's review from today.   Objective:   Physical Exam:  There were no vitals taken for this visit.  Physical Exam Constitutional:      General: She is not in acute distress. Musculoskeletal:        General: No deformity.  Neurological:     Mental Status: She is alert and oriented to person, place, and  time.     Coordination: Coordination normal.  Psychiatric:        Attention and Perception: Attention and perception normal. She does not perceive auditory or visual hallucinations.        Mood and Affect: Mood normal. Mood is not anxious or depressed. Affect is not labile, blunt, angry or inappropriate.        Speech: Speech normal.        Behavior: Behavior normal.        Thought Content: Thought content normal. Thought content is not paranoid or delusional. Thought content does not include homicidal or suicidal ideation. Thought content does not include homicidal or suicidal plan.        Cognition and Memory: Cognition and memory normal.        Judgment: Judgment normal.     Comments: Insight intact    Lab Review:     Component Value Date/Time   NA 137 04/01/2019 2236   K 3.3 (L) 04/01/2019 2236   CL 101 04/01/2019 2236   CO2 26 04/01/2019 2236   GLUCOSE 108 (H) 04/01/2019 2236   BUN 7 04/01/2019 2236   CREATININE 0.84 04/01/2019 2236   CALCIUM 8.8 (L) 04/01/2019 2236   PROT 7.5 04/01/2019 2236   ALBUMIN 3.9 04/01/2019 2236   AST 20 04/01/2019 2236   ALT 14 04/01/2019 2236   ALKPHOS 36 (L) 04/01/2019 2236   BILITOT 0.4 04/01/2019 2236   GFRNONAA >60 04/01/2019 2236   GFRAA >60 04/01/2019  2236       Component Value Date/Time   WBC 8.5 04/01/2019 2236   RBC 4.21 04/01/2019 2236   HGB 12.4 04/01/2019 2236   HCT 37.9 04/01/2019 2236   PLT 340 04/01/2019 2236   MCV 90.0 04/01/2019 2236   MCH 29.5 04/01/2019 2236   MCHC 32.7 04/01/2019 2236   RDW 13.6 04/01/2019 2236   LYMPHSABS 1.4 07/23/2015 1038   MONOABS 0.4 07/23/2015 1038   EOSABS 0.0 07/23/2015 1038   BASOSABS 0.0 07/23/2015 1038    No results found for: POCLITH, LITHIUM   No results found for: PHENYTOIN, PHENOBARB, VALPROATE, CBMZ   .res Assessment: Plan:    Plan:  PDMP reviewed  1. Lexapro 20mg  daily  2. Clonazepam 1.5mg  at hs 3. Trazdone 100mg  - 2 at hs 4. Wellbutrin XL 300mg  daily to 450mg  daily. Denies seizure history. 5. Rexulti 0.5mg  daily - samples given - script sent.  RTC 4 weeks  Patient advised to contact office with any questions, adverse effects, or acute worsening in signs and symptoms.  Discussed potential benefits, risk, and side effects of benzodiazepines to include potential risk of tolerance and dependence, as well as possible drowsiness.  Advised patient not to drive if experiencing drowsiness and to take lowest possible effective dose to minimize risk of dependence and tolerance.  Discussed potential metabolic side effects associated with atypical antipsychotics, as well as potential risk for movement side effects. Advised pt to contact office if movement side effects occur.  -  Diagnoses and all orders for this visit:  Major depressive disorder, recurrent episode, moderate (HCC) -     Brexpiprazole (REXULTI) 0.5 MG TABS; Take 1 tablet (0.5 mg total) by mouth daily.  Generalized anxiety disorder -     clonazePAM (KLONOPIN) 1 MG tablet; TAKE ONE AND 1/2 TABLETS AT BEDTIME AS NEEDED FOR SLEEP.  PTSD (post-traumatic stress disorder)  Insomnia, unspecified type     Please see After Visit Summary for patient specific instructions.  Future Appointments  Date Time Provider  Department Center  09/12/2021  1:00 PM Pauline Good, LCSW [facility] None  10/01/2021 11:00 AM Pauline Good, LCSW [facility] None  10/15/2021  1:00 PM Pauline Good, LCSW [facility] None    No orders of the defined types were placed in this encounter.     -------------------------------

## 2021-08-08 ENCOUNTER — Ambulatory Visit (INDEPENDENT_AMBULATORY_CARE_PROVIDER_SITE_OTHER): Payer: BC Managed Care – PPO | Admitting: Addiction (Substance Use Disorder)

## 2021-08-08 DIAGNOSIS — F422 Mixed obsessional thoughts and acts: Secondary | ICD-10-CM

## 2021-08-08 DIAGNOSIS — F331 Major depressive disorder, recurrent, moderate: Secondary | ICD-10-CM

## 2021-08-08 NOTE — Progress Notes (Signed)
[facility] Counselor/Therapist Progress Note  Patient ID: Monica [name], MRN: [id number],    Date: 08/08/2021  Time Spent:  55 mins  Treatment Type: Individual Therapy  Reported Symptoms: frustrated, feeling stuck/defeated  Mental Status Exam:  Appearance:   Casual and Well Groomed     Behavior:  Appropriate and Sharing  Motor:  Normal  Speech/Language:   Normal Rate  Affect:  Appropriate, Congruent, and Full Range  Mood:  anxious and irritable  Thought process:  circumstantial- focused on her weight gain  Thought content:    Obsessions and Rumination  Sensory/Perceptual disturbances:    Flashback  Orientation:  x4  Attention:  Good  Concentration:  Good  Memory:  WNL  Fund of knowledge:   Good  Insight:    Good  Judgment:   Fair  Impulse Control:  Fair   Risk Assessment: Danger to Self:  No- No SI. No plan, no means, no intent.  Self-injurious Behavior: No Danger to Others: No Duty to Warn:no Physical Aggression / Violence:No  Access to Firearms a concern: No  Gang Involvement:No    Virtual Visit via VIDEO: I connected with client by MyChart video enabled telemedicine/telehealth application, with their informed consent, and verified client privacy and that I am speaking with the correct person using two identifiers. I discussed the limitations, risks, security and privacy concerns of performing psychotherapy and management service virtually and confirmed their location. I also discussed with the patient that there may be a patient responsible charge related to this service and to confirm with the front desk if their insurance covers teletherapy. I also discussed with the patient the availability of in person appointments. The patient expressed understanding and agreed to proceed. I discussed the treatment planning with the client. The client was provided an opportunity to ask questions and all were answered. The client agreed with the plan and demonstrated an  understanding of the instructions. The client was advised to call our office if symptoms worsen or feel they are in a crisis state and need immediate contact. Client also reminded of a crisis line number and to use 9-1-1 if there's an emergency.  Therapist Location: office; Client Location: home.   Subjective: Client processed feeling frustrated with her issues with her MH meds. Client reported feeling stuck/defeated with her weight gain and contributes it this time to the new MH med she started and her lack of follow through with her intermittent fasting. Therapist encouraged client to be kind to herself using MI and encouraged client in her self-esteem journey to help her not shame herself as an attempt for weight loss. Client reported "just wanting to feel good in her own skin" & processed her feelings and thoughts about her health/weight journey. Therapist used MI & CBT with client to support her and encourage her and help her process her desires. Therapist also used SFT/RPT to help client think through some goals while also using CBT to help client challenge negative self talk. Therapist assessed for safety and stability and client denied SI/HI/AVH.  Interventions: Cognitive Behavioral Therapy, Motivational Interviewing, Solution-Oriented/Positive Psychology, and RPT    Diagnosis: No diagnosis found.   Plan of Care: Client to return for weekly therapy with Zoila Shutter, therapist, to review again in 6 months.  Client is to being seeing medication provider for support of mood management for depression, passive SI, and insomnia.    Client to engage in CBT: challenging negative internal ruminations and self-talk AEB journaling daily or expressing  thoughts to support persons in their life and then challenging it with truth.  Client to engage in tapping to tap in healthy cognition that challenges negative rumination or deep negative core belief.  Client to practice DBT distress tolerance skills to  decrease crying spells and thoughts of not being able to endure their suffering AEB using mindfulness, deep breathing, and TIP to increase tolerance for discomfort, discharge emotional distress, and increase their understanding that they can do hard things.  Client to utilize BSP (brainspotting) with therapist to help client identify and process triggers for her her depression, anxiety, and abandonment with goal of reducing said SUDs caused by depression/anxiety by 33% in the next 6 months.  Client to work on self-acceptance to increase her self-esteem despite her weight changes.  Progress: Client still struggling to accept herself due to others' judgements of her & her weight along with her negative internal dialogue that she is trying to change.   Pauline Good, LCSW, LCAS, CCTP, CCS-I, BSP

## 2021-08-21 ENCOUNTER — Ambulatory Visit: Payer: BC Managed Care – PPO | Admitting: Adult Health

## 2021-08-22 ENCOUNTER — Ambulatory Visit: Payer: BC Managed Care – PPO | Admitting: Adult Health

## 2021-08-22 ENCOUNTER — Encounter: Payer: Self-pay | Admitting: Adult Health

## 2021-08-22 DIAGNOSIS — F331 Major depressive disorder, recurrent, moderate: Secondary | ICD-10-CM | POA: Diagnosis not present

## 2021-08-22 MED ORDER — REXULTI 0.5 MG PO TABS: 0.5000 mg | ORAL_TABLET | Freq: Every day | ORAL | 5 refills | Status: DC

## 2021-08-22 MED ORDER — BUPROPION HCL ER (XL) 150 MG PO TB24: ORAL_TABLET | ORAL | 3 refills | Status: DC

## 2021-08-22 NOTE — Progress Notes (Signed)
Monica [name] [id number] [birth year] 51 y.o.  Subjective:   Patient ID:  Monica [name] is a 51 y.o. (DOB 1971/02/26) female.  Chief Complaint: No chief complaint on file.   HPI Monica [name] presents to the office today for follow-up of MDD, GAD, PTSD and insomnia.  Describes mood today as "ok". Pleasant. Increased tearfulness. Mood symptoms - denies depression, anxiety and irritability. Reports in creased obsessions and ruminations over her weight - has gained 6 pounds. Denies recent panic attacks. Stating "I'm feel like I'm doing good". Feels like the medications are helpful. Seeing therapist - Monica [name]. Stable interest and motivation. Taking medications as prescribed.  Energy levels improved. Active, has a regular exercise routine. Walking dog.  Enjoys some usual interests and activities. Single. Lives alone - with dog - daughter at [facility] - traveling abroad - [place]. Spending time with family and friends.  Appetite decreased. Weight loss - intermittent fasting. Working with [employer] [facility]. Sleeps well most nights. Averages 7 to 8 hours of broken sleep, not everyday, but able to get back to sleep. Focus and concentration difficulties. Completing tasks. Managing aspects of household. 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  Denies SI or HI.  Denies AH or VH.  Previous medication trials: Adderall, Trazadone, Clonazepam, Paxil, Sertraline   Flowsheet Row Admission (Discharged) from 09/03/2020 in [facility] from 04/01/2019 in [facility]  C-SSRS RISK CATEGORY No Risk High Risk        Review of Systems:  Review of Systems  Musculoskeletal:  Negative for gait problem.  Neurological:  Negative for tremors.  Psychiatric/Behavioral:         Please refer to HPI    Medications: I have reviewed the patient's current medications.  Current Outpatient Medications  Medication Sig Dispense Refill   Brexpiprazole  (REXULTI) 0.5 MG TABS Take 1 tablet (0.5 mg total) by mouth daily. 30 tablet 5   buPROPion (WELLBUTRIN XL) 150 MG 24 hr tablet TAKE THREE TABLETS EVERY MORNING. 270 tablet 3   cetirizine (ZYRTEC) 10 MG tablet Take 10 mg by mouth daily.     clonazePAM (KLONOPIN) 1 MG tablet TAKE ONE AND 1/2 TABLETS AT BEDTIME AS NEEDED FOR SLEEP. 45 tablet 2   escitalopram (LEXAPRO) 20 MG tablet Take 1 tablet (20 mg total) by mouth daily. 90 tablet 2   fluticasone (FLONASE) 50 MCG/ACT nasal spray Place 1 spray daily into both nostrils.     levonorgestrel (MIRENA) 20 MCG/DAY IUD 1 each by Intrauterine route once.     oxyCODONE (OXY IR/ROXICODONE) 5 MG immediate release tablet Take 1 tablet (5 mg total) by mouth every 6 (six) hours as needed for moderate pain, severe pain or breakthrough pain. 25 tablet 0   traZODone (DESYREL) 100 MG tablet Take 1-2 tablets (100-200 mg total) by mouth at bedtime. 180 tablet 3   valACYclovir (VALTREX) 500 MG tablet Take 500 mg by mouth 2 (two) times daily as needed (flare up).     No current facility-administered medications for this visit.    Medication Side Effects: None  Allergies:  Allergies  Allergen Reactions   Pristiq [Desvenlafaxine] Other (See Comments)    Not effective   Sertraline Other (See Comments)    Did not feel emotion   Pepto-Bismol [Bismuth Subsalicylate] Rash   Septra [Sulfamethoxazole-Trimethoprim] Rash   Sulfa Antibiotics Rash    Past Medical History:  Diagnosis Date   ADD (attention deficit disorder)    Cholelithiases  symptomatic   Environmental and seasonal allergies    GAD (generalized anxiety disorder)    Heart murmur    per pt told very faint , no symptoms, told nothing to worry about;  pt had echo done 12-24-2011 in epic ef 55-60% and trace MR   Herpes simplex type 1 infection    History of MRSA infection    left great toe in 2010   History of palpitations    ED visit in epic 05/ 2017;  evaluated by cardiology--- dr Demetrius Charity. Tenny Craw note in  epic 08-31-2015 , ST on EKG , no further work-up;  pt had previou echo in epic 12-24-2011 ef 55-60%   History of suicidal behavior 03/2019   [facility] admission for IVC   Insomnia    unspecified   PTSD (post-traumatic stress disorder)    Wears glasses     Past Medical History, Surgical history, Social history, and Family history were reviewed and updated as appropriate.   Please see review of systems for further details on the patient's review from today.   Objective:   Physical Exam:  There were no vitals taken for this visit.  Physical Exam Constitutional:      General: She is not in acute distress. Musculoskeletal:        General: No deformity.  Neurological:     Mental Status: She is alert and oriented to person, place, and time.     Coordination: Coordination normal.  Psychiatric:        Attention and Perception: Attention and perception normal. She does not perceive auditory or visual hallucinations.        Mood and Affect: Mood normal. Mood is not anxious or depressed. Affect is not labile, blunt, angry or inappropriate.        Speech: Speech normal.        Behavior: Behavior normal.        Thought Content: Thought content normal. Thought content is not paranoid or delusional. Thought content does not include homicidal or suicidal ideation. Thought content does not include homicidal or suicidal plan.        Cognition and Memory: Cognition and memory normal.        Judgment: Judgment normal.     Comments: Insight intact     Lab Review:     Component Value Date/Time   NA 137 04/01/2019 2236   K 3.3 (L) 04/01/2019 2236   CL 101 04/01/2019 2236   CO2 26 04/01/2019 2236   GLUCOSE 108 (H) 04/01/2019 2236   BUN 7 04/01/2019 2236   CREATININE 0.84 04/01/2019 2236   CALCIUM 8.8 (L) 04/01/2019 2236   PROT 7.5 04/01/2019 2236   ALBUMIN 3.9 04/01/2019 2236   AST 20 04/01/2019 2236   ALT 14 04/01/2019 2236   ALKPHOS 36 (L) 04/01/2019 2236   BILITOT 0.4 04/01/2019 2236    GFRNONAA >60 04/01/2019 2236   GFRAA >60 04/01/2019 2236       Component Value Date/Time   WBC 8.5 04/01/2019 2236   RBC 4.21 04/01/2019 2236   HGB 12.4 04/01/2019 2236   HCT 37.9 04/01/2019 2236   PLT 340 04/01/2019 2236   MCV 90.0 04/01/2019 2236   MCH 29.5 04/01/2019 2236   MCHC 32.7 04/01/2019 2236   RDW 13.6 04/01/2019 2236   LYMPHSABS 1.4 07/23/2015 1038   MONOABS 0.4 07/23/2015 1038   EOSABS 0.0 07/23/2015 1038   BASOSABS 0.0 07/23/2015 1038    No results found for: "POCLITH", "LITHIUM"   No  results found for: "PHENYTOIN", "PHENOBARB", "VALPROATE", "CBMZ"   .res Assessment: Plan:    Plan:  PDMP reviewed  1. Lexapro 20mg  daily  2. Clonazepam 1.5mg  at hs 3. Trazdone 100mg  - 2 at hs 4. Wellbutrin XL 450mg  daily. Denies seizure history. 5. Rexulti 0.5mg  daily - samples given - script sent and copay card given.  RTC 4 weeks  Patient advised to contact office with any questions, adverse effects, or acute worsening in signs and symptoms.  Discussed potential benefits, risk, and side effects of benzodiazepines to include potential risk of tolerance and dependence, as well as possible drowsiness.  Advised patient not to drive if experiencing drowsiness and to take lowest possible effective dose to minimize risk of dependence and tolerance.  Discussed potential metabolic side effects associated with atypical antipsychotics, as well as potential risk for movement side effects. Advised pt to contact office if movement side effects occur.     Future Appointments  Date Time Provider Department Center  09/12/2021  1:00 PM , LCSW [facility] None  10/01/2021 11:00 AM 11/13/2021, LCSW [facility] None  10/15/2021  1:00 PM 10/03/2021, LCSW [facility] None    No orders of the defined types were placed in this encounter.   -------------------------------

## 2021-08-28 ENCOUNTER — Ambulatory Visit (INDEPENDENT_AMBULATORY_CARE_PROVIDER_SITE_OTHER): Payer: BC Managed Care – PPO | Admitting: Addiction (Substance Use Disorder)

## 2021-08-28 DIAGNOSIS — F411 Generalized anxiety disorder: Secondary | ICD-10-CM | POA: Diagnosis not present

## 2021-08-28 DIAGNOSIS — F422 Mixed obsessional thoughts and acts: Secondary | ICD-10-CM | POA: Diagnosis not present

## 2021-08-28 NOTE — Progress Notes (Signed)
[facility] Counselor/Therapist Progress Note  Patient ID: Monica [name], MRN: [id number],    Date: 08/28/2021  Time Spent:  Treatment Type: Individual Therapy  Reported Symptoms: happier, more hopeful  Mental Status Exam:  Appearance:   Casual and Well Groomed     Behavior:  Appropriate and Sharing  Motor:  Normal  Speech/Language:   Normal Rate  Affect:  Appropriate, Congruent, and Full Range  Mood:  normal  Thought process:  normal  Thought content:    Rumination  Sensory/Perceptual disturbances:    Flashback  Orientation:  x4  Attention:  Good  Concentration:  Good  Memory:  WNL  Fund of knowledge:   Good  Insight:    Good  Judgment:   Fair  Impulse Control:  Fair   Risk Assessment: Danger to Self:  No- No SI. No plan, no means, no intent.  Self-injurious Behavior: No Danger to Others: No Duty to Warn:no Physical Aggression / Violence:No  Access to Firearms a concern: No  Gang Involvement:No    Subjective: Client processed feeling more happy and upbeat since on her new medication and also making progress gaining more hope in her weight-loss journey. Client expressed her fears of not making enough progress and being judged by family and therapist used MI & CBT to support client in processing and challenging those fears. Client made progress agreeing that her family loves her no matter her weight and chose to go to the family reunion next week. Client also continuing to see a counselor and nutritionist at [facility] to help her with  her weight loss journey & dicussed her SMART realistic goals for those nutrition changes using SFT. Client also further processed with client a history of what has worked for her in the past and obstacles that she can plan for to help her not get tripped up again on them. Therapist assessed for safety and stability and client denied SI/HI/AVH.  Interventions: Cognitive Behavioral Therapy, Motivational Interviewing,  Solution-Oriented/Positive Psychology, and RPT    Diagnosis: No diagnosis found.   Plan of Care: Client to return for weekly therapy with Zoila Shutter, therapist, to review again in 6 months.  Client is to being seeing medication provider for support of mood management for depression, passive SI, and insomnia.    Client to engage in CBT: challenging negative internal ruminations and self-talk AEB journaling daily or expressing thoughts to support persons in their life and then challenging it with truth.  Client to engage in tapping to tap in healthy cognition that challenges negative rumination or deep negative core belief.  Client to practice DBT distress tolerance skills to decrease crying spells and thoughts of not being able to endure their suffering AEB using mindfulness, deep breathing, and TIP to increase tolerance for discomfort, discharge emotional distress, and increase their understanding that they can do hard things.  Client to utilize BSP (brainspotting) with therapist to help client identify and process triggers for her her depression, anxiety, and abandonment with goal of reducing said SUDs caused by depression/anxiety by 33% in the next 6 months.  Client to work on self-acceptance to increase her self-esteem despite her weight changes.  Progress: Client made progress choosing to see her family regardless of her weight, not giving in to the fears of judgment.  Client still struggling to accept herself due to others' judgements of her & her weight along with her negative internal dialogue that she is trying to change.   Pauline Good,  LCSW, LCAS, CCTP, CCS-I, BSP

## 2021-09-07 ENCOUNTER — Other Ambulatory Visit: Payer: Self-pay | Admitting: Adult Health

## 2021-09-07 DIAGNOSIS — F411 Generalized anxiety disorder: Secondary | ICD-10-CM

## 2021-09-07 DIAGNOSIS — F331 Major depressive disorder, recurrent, moderate: Secondary | ICD-10-CM

## 2021-09-07 DIAGNOSIS — F4310 Post-traumatic stress disorder, unspecified: Secondary | ICD-10-CM

## 2021-09-12 ENCOUNTER — Ambulatory Visit: Payer: BC Managed Care – PPO | Admitting: Addiction (Substance Use Disorder)

## 2021-09-27 ENCOUNTER — Ambulatory Visit (INDEPENDENT_AMBULATORY_CARE_PROVIDER_SITE_OTHER): Payer: BC Managed Care – PPO | Admitting: Adult Health

## 2021-09-27 ENCOUNTER — Encounter: Payer: Self-pay | Admitting: Adult Health

## 2021-09-27 DIAGNOSIS — F331 Major depressive disorder, recurrent, moderate: Secondary | ICD-10-CM | POA: Diagnosis not present

## 2021-09-27 DIAGNOSIS — F411 Generalized anxiety disorder: Secondary | ICD-10-CM

## 2021-09-27 DIAGNOSIS — F4310 Post-traumatic stress disorder, unspecified: Secondary | ICD-10-CM

## 2021-09-27 DIAGNOSIS — G4700 Insomnia, unspecified: Secondary | ICD-10-CM

## 2021-09-27 DIAGNOSIS — F422 Mixed obsessional thoughts and acts: Secondary | ICD-10-CM

## 2021-09-27 DIAGNOSIS — F4325 Adjustment disorder with mixed disturbance of emotions and conduct: Secondary | ICD-10-CM | POA: Diagnosis not present

## 2021-09-27 MED ORDER — CLONAZEPAM 1 MG PO TABS: ORAL_TABLET | ORAL | 2 refills | Status: DC

## 2021-09-27 NOTE — Progress Notes (Addendum)
Monica [name] [id number] 07-16-1970 51 y.o.  Subjective:   Patient ID:  Monica [name] is a 51 y.o. (DOB [birth year]) female.  Chief Complaint: No chief complaint on file.   HPI Monica [name] presents to the office today for follow-up of MDD, GAD, PTSD and insomnia.  Describes mood today as "ok". Pleasant. Denies tearfulness. Mood symptoms - denies depression, anxiety and irritability. Reports decreased obsessions and ruminations. Denies recent panic attacks. Stating "I'm doing good". Feels like the medications are working well. Seeing therapist - Zoila Shutter. Stable interest and motivation. Taking medications as prescribed.  Energy levels improved. Active, has a regular exercise routine. Walking dog.  Enjoys some usual interests and activities. Single. Lives alone - with dog - daughter at [facility] - traveling abroad - [place]. Spending time with family and friends.  Appetite decreased. Weight loss - 20 pounds - intermittent fasting. Working with [facility]. Sleeps well most nights. Averages 7 to 8 hours. Focus and concentration difficulties. Completing tasks. Managing aspects of household. 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  Denies SI or HI.  Denies AH or VH.  Previous medication trials: Adderall, Trazadone, Clonazepam, Paxil, Sertraline    Flowsheet Row Admission (Discharged) from 09/03/2020 in [facility] from 04/01/2019 in [facility]  C-SSRS RISK CATEGORY No Risk High Risk        Review of Systems:  Review of Systems  Musculoskeletal:  Negative for gait problem.  Neurological:  Negative for tremors.  Psychiatric/Behavioral:         Please refer to HPI    Medications: I have reviewed the patient's current medications.  Current Outpatient Medications  Medication Sig Dispense Refill   Brexpiprazole (REXULTI) 0.5 MG TABS Take 1 tablet (0.5 mg total) by mouth daily. 30 tablet 5   buPROPion  (WELLBUTRIN XL) 150 MG 24 hr tablet TAKE THREE TABLETS EVERY MORNING. 270 tablet 3   cetirizine (ZYRTEC) 10 MG tablet Take 10 mg by mouth daily.     clonazePAM (KLONOPIN) 1 MG tablet TAKE ONE AND 1/2 TABLETS AT BEDTIME AS NEEDED FOR SLEEP. 45 tablet 2   escitalopram (LEXAPRO) 20 MG tablet Take 1 tablet (20 mg total) by mouth daily. 90 tablet 2   fluticasone (FLONASE) 50 MCG/ACT nasal spray Place 1 spray daily into both nostrils.     levonorgestrel (MIRENA) 20 MCG/DAY IUD 1 each by Intrauterine route once.     oxyCODONE (OXY IR/ROXICODONE) 5 MG immediate release tablet Take 1 tablet (5 mg total) by mouth every 6 (six) hours as needed for moderate pain, severe pain or breakthrough pain. 25 tablet 0   traZODone (DESYREL) 100 MG tablet Take 1-2 tablets (100-200 mg total) by mouth at bedtime. 180 tablet 3   valACYclovir (VALTREX) 500 MG tablet Take 500 mg by mouth 2 (two) times daily as needed (flare up).     No current facility-administered medications for this visit.    Medication Side Effects: None  Allergies:  Allergies  Allergen Reactions   Pristiq [Desvenlafaxine] Other (See Comments)    Not effective   Sertraline Other (See Comments)    Did not feel emotion   Pepto-Bismol [Bismuth Subsalicylate] Rash   Septra [Sulfamethoxazole-Trimethoprim] Rash   Sulfa Antibiotics Rash    Past Medical History:  Diagnosis Date   ADD (attention deficit disorder)    Cholelithiases    symptomatic   Environmental and seasonal allergies    GAD (generalized anxiety disorder)  Heart murmur    per pt told very faint , no symptoms, told nothing to worry about;  pt had echo done 12-24-2011 in epic ef 55-60% and trace MR   Herpes simplex type 1 infection    History of MRSA infection    left great toe in 2010   History of palpitations    ED visit in epic 05/ 2017;  evaluated by cardiology--- dr Demetrius Charity. Tenny Craw note in epic 08-31-2015 , ST on EKG , no further work-up;  pt had previou echo in epic 12-24-2011 ef  55-60%   History of suicidal behavior 03/2019   [facility] admission for IVC   Insomnia    unspecified   PTSD (post-traumatic stress disorder)    Wears glasses     Past Medical History, Surgical history, Social history, and Family history were reviewed and updated as appropriate.   Please see review of systems for further details on the patient's review from today.   Objective:   Physical Exam:  There were no vitals taken for this visit.  Physical Exam Constitutional:      General: She is not in acute distress. Musculoskeletal:        General: No deformity.  Neurological:     Mental Status: She is alert and oriented to person, place, and time.     Coordination: Coordination normal.  Psychiatric:        Attention and Perception: Attention and perception normal. She does not perceive auditory or visual hallucinations.        Mood and Affect: Mood normal. Mood is not anxious or depressed. Affect is not labile, blunt, angry or inappropriate.        Speech: Speech normal.        Behavior: Behavior normal.        Thought Content: Thought content normal. Thought content is not paranoid or delusional. Thought content does not include homicidal or suicidal ideation. Thought content does not include homicidal or suicidal plan.        Cognition and Memory: Cognition and memory normal.        Judgment: Judgment normal.     Comments: Insight intact     Lab Review:     Component Value Date/Time   NA 137 04/01/2019 2236   K 3.3 (L) 04/01/2019 2236   CL 101 04/01/2019 2236   CO2 26 04/01/2019 2236   GLUCOSE 108 (H) 04/01/2019 2236   BUN 7 04/01/2019 2236   CREATININE 0.84 04/01/2019 2236   CALCIUM 8.8 (L) 04/01/2019 2236   PROT 7.5 04/01/2019 2236   ALBUMIN 3.9 04/01/2019 2236   AST 20 04/01/2019 2236   ALT 14 04/01/2019 2236   ALKPHOS 36 (L) 04/01/2019 2236   BILITOT 0.4 04/01/2019 2236   GFRNONAA >60 04/01/2019 2236   GFRAA >60 04/01/2019 2236       Component Value Date/Time    WBC 8.5 04/01/2019 2236   RBC 4.21 04/01/2019 2236   HGB 12.4 04/01/2019 2236   HCT 37.9 04/01/2019 2236   PLT 340 04/01/2019 2236   MCV 90.0 04/01/2019 2236   MCH 29.5 04/01/2019 2236   MCHC 32.7 04/01/2019 2236   RDW 13.6 04/01/2019 2236   LYMPHSABS 1.4 07/23/2015 1038   MONOABS 0.4 07/23/2015 1038   EOSABS 0.0 07/23/2015 1038   BASOSABS 0.0 07/23/2015 1038    No results found for: "POCLITH", "LITHIUM"   No results found for: "PHENYTOIN", "PHENOBARB", "VALPROATE", "CBMZ"   .res Assessment: Plan:    Plan:  PDMP reviewed  1. Lexapro 20mg  daily  2. Clonazepam 1.5mg  at hs 3. Trazdone 100mg  - 2 at hs 4. Wellbutrin XL 450mg  daily. Denies seizure history. 5. Rexulti 0.5mg  daily - samples given - script sent and copay card given.  RTC 6 weeks  Patient advised to contact office with any questions, adverse effects, or acute worsening in signs and symptoms.  Discussed potential benefits, risk, and side effects of benzodiazepines to include potential risk of tolerance and dependence, as well as possible drowsiness.  Advised patient not to drive if experiencing drowsiness and to take lowest possible effective dose to minimize risk of dependence and tolerance.  Discussed potential metabolic side effects associated with atypical antipsychotics, as well as potential risk for movement side effects. Advised pt to contact office if movement side effects occur.    Diagnoses and all orders for this visit:  Generalized anxiety disorder -     clonazePAM (KLONOPIN) 1 MG tablet; TAKE ONE AND 1/2 TABLETS AT BEDTIME AS NEEDED FOR SLEEP.  Mixed obsessional thoughts and acts  Major depressive disorder, recurrent episode, moderate (HCC)  Adjustment disorder with mixed disturbance of emotions and conduct  Insomnia, unspecified type  PTSD (post-traumatic stress disorder)     Please see After Visit Summary for patient specific instructions.  Future Appointments  Date Time Provider  Department Center  10/07/2021  2:00 PM , CP-CP None  10/15/2021  1:00 PM Pauline Good, LCSW CP-CP None    No orders of the defined types were placed in this encounter.   -------------------------------

## 2021-09-27 NOTE — Addendum Note (Signed)
Addended by: Dorothyann Gibbs on: 09/27/2021 10:26 AM   Modules accepted: Orders

## 2021-10-01 ENCOUNTER — Ambulatory Visit: Payer: BC Managed Care – PPO | Admitting: Addiction (Substance Use Disorder)

## 2021-10-05 ENCOUNTER — Other Ambulatory Visit: Payer: Self-pay | Admitting: Adult Health

## 2021-10-05 DIAGNOSIS — F331 Major depressive disorder, recurrent, moderate: Secondary | ICD-10-CM

## 2021-10-07 ENCOUNTER — Ambulatory Visit (INDEPENDENT_AMBULATORY_CARE_PROVIDER_SITE_OTHER): Payer: BC Managed Care – PPO | Admitting: Addiction (Substance Use Disorder)

## 2021-10-07 DIAGNOSIS — F411 Generalized anxiety disorder: Secondary | ICD-10-CM | POA: Diagnosis not present

## 2021-10-07 NOTE — Progress Notes (Signed)
      [facility] Counselor/Therapist Progress Note  Patient ID: YUDIT MODESITT, MRN: [id number],    Date: 10/07/2021  Time Spent:  Treatment Type: Individual Therapy  Reported Symptoms: stable, more motivated  Mental Status Exam:  Appearance:   Casual and Well Groomed     Behavior:  Appropriate and Sharing  Motor:  Normal  Speech/Language:   Normal Rate  Affect:  Appropriate, Congruent, and Full Range  Mood:  normal  Thought process:  normal  Thought content:    Rumination  Sensory/Perceptual disturbances:    Flashback  Orientation:  x4  Attention:  [name]  Concentration:  [name]  Memory:  WNL  Fund of knowledge:   [name]  Insight:    [name]  Judgment:   Fair  Impulse Control:  [name]   Risk Assessment: Danger to Self:  No- No SI. No plan, no means, no intent.  Self-injurious Behavior: No Danger to Others: No Duty to Warn:no Physical Aggression / Violence:No  Access to Firearms a concern: No  Gang Involvement:No    Subjective: Client processed progress in her weight loss journey and also in her thoughts about her body and her inner beauty that are contributing to more kind self talk. Client made progress going to the family reunion and her motivation for going despite her number on a scale. Client processed her recent happier feelings and feelings of motivation for her health journey; therapist used MI & CBT with client to affirm her progress, validate her feelings, and explore motivating next steps for client to take care of herself physically but also engage in more positive self talk. Discussed continued SMART goals as a way to continue her nutrition goals (SFT). Therapist assessed for safety and stability and client denied SI/HI/AVH.  Interventions: Cognitive Behavioral Therapy, Motivational Interviewing, Solution-Oriented/Positive Psychology, and RPT    Diagnosis:   ICD-10-CM   1. Generalized anxiety disorder  F41.1        Plan of Care: Client to return for  weekly therapy with Monica [name], therapist, to review again in 6 months.  Client is to being seeing medication provider for support of mood management for depression, passive SI, and insomnia.    Client to engage in CBT: challenging negative internal ruminations and self-talk AEB journaling daily or expressing thoughts to support persons in their life and then challenging it with truth.  Client to engage in tapping to tap in healthy cognition that challenges negative rumination or deep negative core belief.  Client to practice DBT distress tolerance skills to decrease crying spells and thoughts of not being able to endure their suffering AEB using mindfulness, deep breathing, and TIP to increase tolerance for discomfort, discharge emotional distress, and increase their understanding that they can do hard things.  Client to utilize BSP (brainspotting) with therapist to help client identify and process triggers for her her depression, anxiety, and abandonment with goal of reducing said SUDs caused by depression/anxiety by 33% in the next 6 months.  Client to work on self-acceptance to increase her self-esteem despite her weight changes.  Progress: Client processed progress in her weight loss journey and also in her thoughts about her body and her inner beauty that are contributing to more kind self talk.  Client made progress choosing to see her family regardless of her weight, not giving in to the fears of judgment.   Monica Good, LCSW, LCAS, CCTP, CCS-I, BSP

## 2021-10-15 ENCOUNTER — Ambulatory Visit: Payer: BC Managed Care – PPO | Admitting: Addiction (Substance Use Disorder)

## 2021-10-23 ENCOUNTER — Other Ambulatory Visit (HOSPITAL_COMMUNITY): Payer: Self-pay

## 2021-10-24 ENCOUNTER — Other Ambulatory Visit (HOSPITAL_COMMUNITY): Payer: Self-pay

## 2021-10-24 MED ORDER — SAXENDA 18 MG/3ML ~~LOC~~ SOPN
PEN_INJECTOR | SUBCUTANEOUS | 5 refills | Status: DC
  Filled 2021-10-24: qty 15, 28d supply, fill #0
  Filled 2021-11-26: qty 15, 28d supply, fill #1
  Filled 2022-01-02: qty 15, 28d supply, fill #2
  Filled 2022-02-13: qty 15, 28d supply, fill #3
  Filled 2022-04-30: qty 15, 28d supply, fill #4

## 2021-10-24 MED ORDER — BD PEN NEEDLE NANO U/F 32G X 4 MM MISC
0 refills | Status: DC
  Filled 2021-10-24 (×2): qty 100, 90d supply, fill #0

## 2021-10-31 ENCOUNTER — Ambulatory Visit: Payer: BC Managed Care – PPO | Admitting: Addiction (Substance Use Disorder)

## 2021-11-08 ENCOUNTER — Ambulatory Visit (INDEPENDENT_AMBULATORY_CARE_PROVIDER_SITE_OTHER): Payer: BC Managed Care – PPO | Admitting: Adult Health

## 2021-11-13 ENCOUNTER — Ambulatory Visit (INDEPENDENT_AMBULATORY_CARE_PROVIDER_SITE_OTHER): Payer: BC Managed Care – PPO | Admitting: Adult Health

## 2021-11-13 ENCOUNTER — Encounter: Payer: Self-pay | Admitting: Adult Health

## 2021-11-13 DIAGNOSIS — F331 Major depressive disorder, recurrent, moderate: Secondary | ICD-10-CM

## 2021-11-13 DIAGNOSIS — F411 Generalized anxiety disorder: Secondary | ICD-10-CM

## 2021-11-13 DIAGNOSIS — F422 Mixed obsessional thoughts and acts: Secondary | ICD-10-CM | POA: Diagnosis not present

## 2021-11-13 DIAGNOSIS — F4310 Post-traumatic stress disorder, unspecified: Secondary | ICD-10-CM | POA: Diagnosis not present

## 2021-11-13 NOTE — Progress Notes (Addendum)
Monica [name] [id number] 10/08/1970 51 y.o.  Subjective:   Patient ID:  Monica [name] is a 51 y.o. (DOB 01-Jan-1971) female.  Chief Complaint: No chief complaint on file.   HPI Monica [name] presents to the office today for follow-up of MDD, GAD, PTSD and insomnia.  Describes mood today as "ok". Pleasant. Denies tearfulness. Mood symptoms - denies depression, anxiety and irritability. Reports decreased obsessions and ruminations. Denies recent panic attacks. Mood is consistent. Stating "I'm doing alright". Feels like the medications are working well. Seeing therapist - Zoila Shutter. Stable interest and motivation. Taking medications as prescribed.  Energy levels improved. Active, has a regular exercise routine. Walking dog.  Enjoys some usual interests and activities. Single. Lives alone - with dog - daughter at [facility] - traveling abroad - [place]. Spending time with family and friends.  Appetite decreased. Weight loss. Working with [facility]. Sleeps well most nights. Averages 7 to 8 hours. Focus and concentration difficulties. Completing tasks. Managing aspects of household. Has restarted school. 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  Denies SI or HI.  Denies AH or VH.  Previous medication trials: Adderall, Trazadone, Clonazepam, Paxil, Sertraline   Flowsheet Row Admission (Discharged) from 09/03/2020 in [facility] from [facility]01/2019 in [facility]  C-SSRS RISK CATEGORY No Risk High Risk        Review of Systems:  Review of Systems  Musculoskeletal:  Negative for gait problem.  Neurological:  Negative for tremors.  Psychiatric/Behavioral:         Please refer to HPI    Medications: I have reviewed the patient's current medications.  Current Outpatient Medications  Medication Sig Dispense Refill   Brexpiprazole (REXULTI) 0.5 MG TABS Take 1 tablet (0.5 mg total) by mouth daily. 30 tablet 5    buPROPion (WELLBUTRIN XL) 150 MG 24 hr tablet TAKE ONE TABLET EVERY MORNING FOR 7 DAYS, THEN INCREASE TO TWO TABLETS DAILY. 180 tablet 0   cetirizine (ZYRTEC) 10 MG tablet Take 10 mg by mouth daily.     clonazePAM (KLONOPIN) 1 MG tablet TAKE ONE AND 1/2 TABLETS AT BEDTIME AS NEEDED FOR SLEEP. 45 tablet 2   escitalopram (LEXAPRO) 20 MG tablet Take 1 tablet (20 mg total) by mouth daily. 90 tablet 2   fluticasone (FLONASE) 50 MCG/ACT nasal spray Place 1 spray daily into both nostrils.     Insulin Pen Needle (BD PEN NEEDLE NANO U/F) 32G X 4 MM MISC Use as directed with saxenda. 100 each 0   levonorgestrel (MIRENA) 20 MCG/DAY IUD 1 each by Intrauterine route once.     Liraglutide -Weight Management (SAXENDA) 18 MG/3ML SOPN Inject .6 MG into the skin daily for 7 days, 1.2 mg into the skin daily for 7 days, 1.8 mg into the skin for 7 days and 2.4 mg into the skin for 7 days. 15 mL 5   oxyCODONE (OXY IR/ROXICODONE) 5 MG immediate release tablet Take 1 tablet (5 mg total) by mouth every 6 (six) hours as needed for moderate pain, severe pain or breakthrough pain. 25 tablet 0   traZODone (DESYREL) 100 MG tablet Take 1-2 tablets (100-200 mg total) by mouth at bedtime. 180 tablet 3   valACYclovir (VALTREX) 500 MG tablet Take 500 mg by mouth 2 (two) times daily as needed (flare up).     No current facility-administered medications for this visit.    Medication Side Effects: None  Allergies:  Allergies  Allergen Reactions  Pristiq [Desvenlafaxine] Other (See Comments)    Not effective   Sertraline Other (See Comments)    Did not feel emotion   Pepto-Bismol [Bismuth Subsalicylate] Rash   Septra [Sulfamethoxazole-Trimethoprim] Rash   Sulfa Antibiotics Rash    Past Medical History:  Diagnosis Date   ADD (attention deficit disorder)    Cholelithiases    symptomatic   Environmental and seasonal allergies    GAD (generalized anxiety disorder)    Heart murmur    per pt told very faint , no symptoms,  told nothing to worry about;  pt had echo done 12-24-2011 in epic ef 55-60% and trace MR   Herpes simplex type 1 infection    History of MRSA infection    left great toe in 2010   History of palpitations    ED visit in epic 05/ 2017;  evaluated by cardiology--- dr Demetrius Charity. Tenny Craw note in epic 08-31-2015 , ST on EKG , no further work-up;  pt had previou echo in epic 12-24-2011 ef 55-60%   History of suicidal behavior 03/2019   [facility] admission for IVC   Insomnia    unspecified   PTSD (post-traumatic stress disorder)    Wears glasses     Past Medical History, Surgical history, Social history, and Family history were reviewed and updated as appropriate.   Please see review of systems for further details on the patient's review from today.   Objective:   Physical Exam:  There were no vitals taken for this visit.  Physical Exam Constitutional:      General: She is not in acute distress. Musculoskeletal:        General: No deformity.  Neurological:     Mental Status: She is alert and oriented to person, place, and time.     Coordination: Coordination normal.  Psychiatric:        Attention and Perception: Attention and perception normal. She does not perceive auditory or visual hallucinations.        Mood and Affect: Mood normal. Mood is not anxious or depressed. Affect is not labile, blunt, angry or inappropriate.        Speech: Speech normal.        Behavior: Behavior normal.        Thought Content: Thought content normal. Thought content is not paranoid or delusional. Thought content does not include homicidal or suicidal ideation. Thought content does not include homicidal or suicidal plan.        Cognition and Memory: Cognition and memory normal.        Judgment: Judgment normal.     Comments: Insight intact     Lab Review:     Component Value Date/Time   NA 137 04/01/2019 2236   K 3.3 (L) 04/01/2019 2236   CL 101 04/01/2019 2236   CO2 26 04/01/2019 2236   GLUCOSE 108 (H)  04/01/2019 2236   BUN 7 04/01/2019 2236   CREATININE 0.84 04/01/2019 2236   CALCIUM 8.8 (L) 04/01/2019 2236   PROT 7.5 04/01/2019 2236   ALBUMIN 3.9 04/01/2019 2236   AST 20 04/01/2019 2236   ALT 14 04/01/2019 2236   ALKPHOS 36 (L) 04/01/2019 2236   BILITOT 0.4 04/01/2019 2236   GFRNONAA >60 04/01/2019 2236   GFRAA >60 04/01/2019 2236       Component Value Date/Time   WBC 8.5 04/01/2019 2236   RBC 4.21 04/01/2019 2236   HGB 12.4 04/01/2019 2236   HCT 37.9 04/01/2019 2236   PLT 340 04/01/2019  2236   MCV 90.0 04/01/2019 2236   MCH 29.5 04/01/2019 2236   MCHC 32.7 04/01/2019 2236   RDW 13.6 04/01/2019 2236   LYMPHSABS 1.4 07/23/2015 1038   MONOABS 0.4 07/23/2015 1038   EOSABS 0.0 07/23/2015 1038   BASOSABS 0.0 07/23/2015 1038    No results found for: "POCLITH", "LITHIUM"   No results found for: "PHENYTOIN", "PHENOBARB", "VALPROATE", "CBMZ"   .res Assessment: Plan:    Plan:  PDMP reviewed  1. Lexapro 20mg  daily  2. Clonazepam 1.5mg  at hs 3. Trazdone 100mg  - 2 at hs 4. Wellbutrin XL 450mg  daily. Denies seizure history. 5. Rexulti 0.5mg  daily - samples given - script sent and copay card given.  RTC 3 months  Patient advised to contact office with any questions, adverse effects, or acute worsening in signs and symptoms.  Discussed potential benefits, risk, and side effects of benzodiazepines to include potential risk of tolerance and dependence, as well as possible drowsiness.  Advised patient not to drive if experiencing drowsiness and to take lowest possible effective dose to minimize risk of dependence and tolerance.  Discussed potential metabolic side effects associated with atypical antipsychotics, as well as potential risk for movement side effects. Advised pt to contact office if movement side effects occur.     Diagnoses and all orders for this visit:  Generalized anxiety disorder  Mixed obsessional thoughts and acts  Major depressive disorder,  recurrent episode, moderate (HCC)  PTSD (post-traumatic stress disorder)     Please see After Visit Summary for patient specific instructions.  Future Appointments  Date Time Provider Department Center  12/03/2021  3:00 PM , LCSW CP-CP None  12/17/2021  3:00 PM 12/05/2021, LCSW CP-CP None    No orders of the defined types were placed in this encounter.   -------------------------------

## 2021-11-26 ENCOUNTER — Other Ambulatory Visit (HOSPITAL_COMMUNITY): Payer: Self-pay

## 2021-12-03 ENCOUNTER — Ambulatory Visit: Payer: BC Managed Care – PPO | Admitting: Addiction (Substance Use Disorder)

## 2021-12-11 ENCOUNTER — Telehealth: Payer: Self-pay | Admitting: Oncology

## 2021-12-11 NOTE — Telephone Encounter (Signed)
Attempted to contact patient in regards to referral, no answer so voicemail was left for patient to call back  

## 2021-12-13 ENCOUNTER — Other Ambulatory Visit: Payer: Self-pay | Admitting: Family Medicine

## 2021-12-13 DIAGNOSIS — M542 Cervicalgia: Secondary | ICD-10-CM

## 2021-12-13 DIAGNOSIS — R590 Localized enlarged lymph nodes: Secondary | ICD-10-CM

## 2021-12-17 ENCOUNTER — Ambulatory Visit: Payer: BC Managed Care – PPO | Admitting: Addiction (Substance Use Disorder)

## 2021-12-24 ENCOUNTER — Other Ambulatory Visit: Payer: Self-pay | Admitting: *Deleted

## 2021-12-24 DIAGNOSIS — D75839 Thrombocytosis, unspecified: Secondary | ICD-10-CM

## 2021-12-30 ENCOUNTER — Other Ambulatory Visit: Payer: Self-pay | Admitting: Nurse Practitioner

## 2021-12-30 DIAGNOSIS — D75839 Thrombocytosis, unspecified: Secondary | ICD-10-CM

## 2021-12-31 ENCOUNTER — Inpatient Hospital Stay: Payer: BC Managed Care – PPO | Attending: Oncology

## 2021-12-31 ENCOUNTER — Inpatient Hospital Stay: Payer: BC Managed Care – PPO | Admitting: Oncology

## 2021-12-31 VITALS — BP 123/70 | HR 88 | Temp 98.1°F | Resp 18 | Ht 66.0 in | Wt 209.2 lb

## 2021-12-31 DIAGNOSIS — D75839 Thrombocytosis, unspecified: Secondary | ICD-10-CM | POA: Insufficient documentation

## 2021-12-31 DIAGNOSIS — E039 Hypothyroidism, unspecified: Secondary | ICD-10-CM | POA: Diagnosis not present

## 2021-12-31 LAB — CBC WITH DIFFERENTIAL (CANCER CENTER ONLY)
Abs Immature Granulocytes: 0.01 10*3/uL (ref 0.00–0.07)
Basophils Absolute: 0.1 10*3/uL (ref 0.0–0.1)
Basophils Relative: 1 %
Eosinophils Absolute: 0.2 10*3/uL (ref 0.0–0.5)
Eosinophils Relative: 3 %
HCT: 36.9 % (ref 36.0–46.0)
Hemoglobin: 12.1 g/dL (ref 12.0–15.0)
Immature Granulocytes: 0 %
Lymphocytes Relative: 31 %
Lymphs Abs: 2.1 10*3/uL (ref 0.7–4.0)
MCH: 30.2 pg (ref 26.0–34.0)
MCHC: 32.8 g/dL (ref 30.0–36.0)
MCV: 92 fL (ref 80.0–100.0)
Monocytes Absolute: 0.5 10*3/uL (ref 0.1–1.0)
Monocytes Relative: 8 %
Neutro Abs: 3.7 10*3/uL (ref 1.7–7.7)
Neutrophils Relative %: 57 %
Platelet Count: 506 10*3/uL — ABNORMAL HIGH (ref 150–400)
RBC: 4.01 MIL/uL (ref 3.87–5.11)
RDW: 15.2 % (ref 11.5–15.5)
WBC Count: 6.6 10*3/uL (ref 4.0–10.5)
nRBC: 0 % (ref 0.0–0.2)

## 2021-12-31 LAB — SAVE SMEAR(SSMR), FOR PROVIDER SLIDE REVIEW

## 2021-12-31 LAB — FERRITIN: Ferritin: 74 ng/mL (ref 11–307)

## 2021-12-31 NOTE — Progress Notes (Signed)
[facility] Patient Consult   Requesting MD: Glenis Smoker, [address] [place],  [place] [address]   Monica [name] 51 y.o.  07/07/1970    Reason for Consult: Thrombocytosis   HPI: Monica [name] reports a history of an elevated platelet count for several years.  The platelet has been checked at a weight loss facility and has been in the "500s "since 2021.  She developed COVID-19 on 11/15/2021 with a cough, sore throat, and fever/chills.  She completed a course of Paxlovid. She saw Dr. Lindell Noe on 12/03/2021.  A CBC found the hemoglobin 11.9, MCV 88.5, white count 9.8, platelets 663,000, and absolute neutrophil count 6.7. On 02/28/2021 the hemoglobin returned at 12.7, platelets 460,000, white count 7.1, MCV 86.9, and ANC 4.8.  She reports persistent soreness in the neck since the COVID infection.  No history of venous or arterial thrombosis.  Past Medical History:  Diagnosis Date   ADD (attention deficit disorder)    Cholelithiases    symptomatic   Environmental and seasonal allergies    GAD (generalized anxiety disorder)    Heart murmur    per pt told very faint , no symptoms, told nothing to worry about;  pt had echo done 12-24-2011 in epic ef 55-60% and trace MR   Herpes simplex type 1 infection    History of MRSA infection    left great toe in 2010   History of palpitations    ED visit in epic 05/ 2017;  evaluated by cardiology--- dr Mamie Nick. Harrington Challenger note in epic 08-31-2015 , ST on EKG , no further work-up;  pt had previou echo in epic 12-24-2011 ef 55-60%   History of suicidal behavior 03/2019   [facility] admission for IVC   Insomnia    unspecified   PTSD (post-traumatic stress disorder)    Wears glasses     .  G1, P1  Past Surgical History:  Procedure Laterality Date   CHOLECYSTECTOMY N/A 09/03/2020   Procedure: LAPAROSCOPIC CHOLECYSTECTOMY;  Surgeon: Coralie Keens, MD;  Location: [facility];  Service: General;  Laterality:  N/A;   REDUCTION MAMMAPLASTY Bilateral 1999    Medications: Reviewed  Allergies:  Allergies  Allergen Reactions   Pristiq [Desvenlafaxine] Other (See Comments)    Not effective   Sertraline Other (See Comments)    Did not feel emotion   Pepto-Bismol [Bismuth Subsalicylate] Rash   Septra [Sulfamethoxazole-Trimethoprim] Rash   Sulfa Antibiotics Rash    Family history: No family history of cancer  Social History:   She lives alone in [place].  She is a Chief Technology Officer.  She does not use cigarettes.  Rare alcohol use.  No risk factor for HIV or hepatitis.  ROS:   Positives include: Weight loss since starting liraglutide, fatigue following COVID-19  A complete ROS was otherwise negative.  Physical Exam:  Blood pressure 123/70, pulse 88, temperature 98.1 F (36.7 C), temperature source Oral, resp. rate 18, height 5\' 6"  (1.676 m), weight 209 lb 3.2 oz (94.9 kg), SpO2 99 %.  HEENT: Oropharynx without visible mass, neck without mass Lungs: Clear bilaterally Cardiac: Regular rate and rhythm Abdomen: No hepatosplenomegaly  Vascular: No leg edema Lymph nodes: No cervical, supraclavicular, axillary, or inguinal nodes Neurologic: Alert and oriented, the motor exam appears intact in the upper and lower extremities bilaterally Skin: No rash Musculoskeletal: No spine tenderness   LAB:  CBC  Lab Results  Component Value Date   WBC 6.6 12/31/2021  HGB 12.1 12/31/2021   HCT 36.9 12/31/2021   MCV 92.0 12/31/2021   PLT 506 (H) 12/31/2021   NEUTROABS 3.7 12/31/2021    Blood smear: Few ovalocytes, the Red cell morphology is otherwise unremarkable.  The majority the white cells are mature neutrophils and lymphocytes.  Few atypical "reactive "appearing lymphocytes.  No blasts or other young forms are seen.  The platelets appear mildly increased in number.  The majority the platelets are small.  Few large platelets.  No platelet clumps.     Assessment/Plan:    Thrombocytosis Thrombocytosis dates to at least December 2022 based on available laboratory data, and 22021per patient report Seasonal allergies Hypothyroidism G1, P1 Anxiety disorder Herpes simplex  COVID 26 November 2021   Disposition:   Ms. Monica [name] is referred for evaluation of thrombocytosis.  I discussed the differential diagnosis with her.  She does not have evidence of a systemic inflammatory condition such as a collagen vascular disorder.  It is possible the recent COVID-19 infection is contributing to the elevated platelet count, but the thrombocytosis appears to predate the COVID-19 infection.  The hematologic indices and peripheral blood smear do not suggest iron deficiency.  We obtained a ferritin level today.  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  She has no history of arterial/venous thromboembolic disease or coronary artery disease.  There is no indication for platelet lowering therapy in her case.  We will submit a myeloproliferative disorder next generation sequencing panel to look for evidence of a mutation associated with essential thrombocytosis.  I recommend she begin daily aspirin therapy if she is confirmed to have essential thrombocytosis.    She will return for an office visit in 3 months.   Betsy Coder, MD  12/31/2021, 1:52 PM

## 2022-01-02 ENCOUNTER — Other Ambulatory Visit: Payer: BC Managed Care – PPO

## 2022-01-02 ENCOUNTER — Other Ambulatory Visit (HOSPITAL_COMMUNITY): Payer: Self-pay

## 2022-01-08 LAB — JAK2 (INCLUDING V617F AND EXON 12), MPL,& CALR W/RFL MPN PANEL (NGS)

## 2022-01-10 ENCOUNTER — Other Ambulatory Visit: Payer: BC Managed Care – PPO

## 2022-01-10 ENCOUNTER — Encounter: Payer: BC Managed Care – PPO | Admitting: Nurse Practitioner

## 2022-01-13 ENCOUNTER — Telehealth: Payer: Self-pay

## 2022-01-13 NOTE — Telephone Encounter (Signed)
-----   Message from Monica Pier, MD sent at 01/12/2022  6:59 AM EST ----- Please call patient, testing for a myeloproliferative disorder is negative, f/u as scheduled

## 2022-01-13 NOTE — Telephone Encounter (Signed)
Patient gave verbal understanding and had no further questions or concerns  

## 2022-01-27 ENCOUNTER — Encounter: Payer: Self-pay | Admitting: Adult Health

## 2022-01-27 ENCOUNTER — Ambulatory Visit (INDEPENDENT_AMBULATORY_CARE_PROVIDER_SITE_OTHER): Payer: BC Managed Care – PPO | Admitting: Adult Health

## 2022-01-27 DIAGNOSIS — F4310 Post-traumatic stress disorder, unspecified: Secondary | ICD-10-CM | POA: Diagnosis not present

## 2022-01-27 DIAGNOSIS — F331 Major depressive disorder, recurrent, moderate: Secondary | ICD-10-CM | POA: Diagnosis not present

## 2022-01-27 DIAGNOSIS — F411 Generalized anxiety disorder: Secondary | ICD-10-CM

## 2022-01-27 DIAGNOSIS — G4700 Insomnia, unspecified: Secondary | ICD-10-CM

## 2022-01-27 MED ORDER — ESCITALOPRAM OXALATE 20 MG PO TABS: 20.0000 mg | ORAL_TABLET | Freq: Every day | ORAL | 1 refills | Status: DC

## 2022-01-27 MED ORDER — CLONAZEPAM 1 MG PO TABS: ORAL_TABLET | ORAL | 2 refills | Status: DC

## 2022-01-27 MED ORDER — TRAZODONE HCL 100 MG PO TABS: 100.0000 mg | ORAL_TABLET | Freq: Every day | ORAL | 1 refills | Status: DC

## 2022-01-27 MED ORDER — BUPROPION HCL ER (XL) 150 MG PO TB24: ORAL_TABLET | ORAL | 1 refills | Status: DC

## 2022-01-27 NOTE — Progress Notes (Signed)
Monica [name] [id number] 05-15-1970 51 y.o.  Subjective:   Patient ID:  Monica [name] is a 51 y.o. (DOB 30-Oct-1970) female.  Chief Complaint: No chief complaint on file.   HPI Monica [name] presents to the office today for follow-up of MDD, GAD, PTSD and insomnia.  Describes mood today as "ok". Pleasant. Denies tearfulness. Mood symptoms - reports some depression (4 out of 10). Denies irritability. Feels anxious. Reports decreased obsessions and ruminations. Denies recent panic attacks. Mood is consistent. Stating "I'm doing alright". Feels like the medications are working well. Seeing therapist - Zoila Shutter. Stable interest and motivation. Taking medications as prescribed.  Energy levels improved. Active, has a regular exercise routine. Walking dog.  Enjoys some usual interests and activities. Single. Lives alone - with dog - daughter at [facility]. Spending time with family and friends.  Appetite decreased. Weight loss. Working with [facility]. Sleeps well most nights. Averages 7 to 8 hours. Focus and concentration difficulties. Completing tasks. Managing aspects of household. Has restarted school. 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  Denies SI or HI.  Denies AH or VH.  Previous medication trials: Adderall, Trazadone, Clonazepam, Paxil, Sertraline    Flowsheet Row Admission (Discharged) from 09/03/2020 in [facility] from 04/01/2019 in [facility]  C-SSRS RISK CATEGORY No Risk High Risk        Review of Systems:  Review of Systems  Musculoskeletal:  Negative for gait problem.  Neurological:  Negative for tremors.  Psychiatric/Behavioral:         Please refer to HPI    Medications: I have reviewed the patient's current medications.  Current Outpatient Medications  Medication Sig Dispense Refill   Brexpiprazole (REXULTI) 0.5 MG TABS Take 1 tablet (0.5 mg total) by mouth daily. 30 tablet 5    buPROPion (WELLBUTRIN XL) 150 MG 24 hr tablet TAKE THREE TABLETS EVERY MORNING. 270 tablet 1   cetirizine (ZYRTEC) 10 MG tablet Take 10 mg by mouth daily.     clonazePAM (KLONOPIN) 1 MG tablet TAKE ONE AND 1/2 TABLETS AT BEDTIME AS NEEDED FOR SLEEP. 45 tablet 2   escitalopram (LEXAPRO) 20 MG tablet Take 1 tablet (20 mg total) by mouth daily. 90 tablet 1   fluticasone (FLONASE) 50 MCG/ACT nasal spray Place 1 spray daily into both nostrils.     Insulin Pen Needle (BD PEN NEEDLE NANO U/F) 32G X 4 MM MISC Use as directed with saxenda. 100 each 0   levonorgestrel (MIRENA) 20 MCG/DAY IUD 1 each by Intrauterine route once.     Liraglutide -Weight Management (SAXENDA) 18 MG/3ML SOPN Inject .6 MG into the skin daily for 7 days, 1.2 mg into the skin daily for 7 days, 1.8 mg into the skin for 7 days and 2.4 mg into the skin for 7 days, then 3mg  daily 15 mL 5   traZODone (DESYREL) 100 MG tablet Take 1-2 tablets (100-200 mg total) by mouth at bedtime. 180 tablet 1   valACYclovir (VALTREX) 500 MG tablet Take 500 mg by mouth 2 (two) times daily as needed (flare up). (Patient not taking: Reported on 12/31/2021)     No current facility-administered medications for this visit.    Medication Side Effects: None  Allergies:  Allergies  Allergen Reactions   Pristiq [Desvenlafaxine] Other (See Comments)    Not effective   Sertraline Other (See Comments)    Did not feel emotion   Pepto-Bismol [Bismuth Subsalicylate] Rash  Septra [Sulfamethoxazole-Trimethoprim] Rash   Sulfa Antibiotics Rash    Past Medical History:  Diagnosis Date   ADD (attention deficit disorder)    Cholelithiases    symptomatic   Environmental and seasonal allergies    GAD (generalized anxiety disorder)    Heart murmur    per pt told very faint , no symptoms, told nothing to worry about;  pt had echo done 12-24-2011 in epic ef 55-60% and trace MR   Herpes simplex type 1 infection    History of MRSA infection    left great toe in  2010   History of palpitations    ED visit in epic 05/ 2017;  evaluated by cardiology--- dr Demetrius Charity. Tenny Craw note in epic 08-31-2015 , ST on EKG , no further work-up;  pt had previou echo in epic 12-24-2011 ef 55-60%   History of suicidal behavior 03/2019   [facility] admission for IVC   Insomnia    unspecified   PTSD (post-traumatic stress disorder)    Wears glasses     Past Medical History, Surgical history, Social history, and Family history were reviewed and updated as appropriate.   Please see review of systems for further details on the patient's review from today.   Objective:   Physical Exam:  There were no vitals taken for this visit.  Physical Exam Constitutional:      General: She is not in acute distress. Musculoskeletal:        General: No deformity.  Neurological:     Mental Status: She is alert and oriented to person, place, and time.     Coordination: Coordination normal.  Psychiatric:        Attention and Perception: Attention and perception normal. She does not perceive auditory or visual hallucinations.        Mood and Affect: Mood normal. Mood is not anxious or depressed. Affect is not labile, blunt, angry or inappropriate.        Speech: Speech normal.        Behavior: Behavior normal.        Thought Content: Thought content normal. Thought content is not paranoid or delusional. Thought content does not include homicidal or suicidal ideation. Thought content does not include homicidal or suicidal plan.        Cognition and Memory: Cognition and memory normal.        Judgment: Judgment normal.     Comments: Insight intact     Lab Review:     Component Value Date/Time   NA 137 04/01/2019 2236   K 3.3 (L) 04/01/2019 2236   CL 101 04/01/2019 2236   CO2 26 04/01/2019 2236   GLUCOSE 108 (H) 04/01/2019 2236   BUN 7 04/01/2019 2236   CREATININE 0.84 04/01/2019 2236   CALCIUM 8.8 (L) 04/01/2019 2236   PROT 7.5 04/01/2019 2236   ALBUMIN 3.9 04/01/2019 2236   AST 20  04/01/2019 2236   ALT 14 04/01/2019 2236   ALKPHOS 36 (L) 04/01/2019 2236   BILITOT 0.4 04/01/2019 2236   GFRNONAA >60 04/01/2019 2236   GFRAA >60 04/01/2019 2236       Component Value Date/Time   WBC 6.6 12/31/2021 1304   WBC 8.5 04/01/2019 2236   RBC 4.01 12/31/2021 1304   HGB 12.1 12/31/2021 1304   HCT 36.9 12/31/2021 1304   PLT 506 (H) 12/31/2021 1304   MCV 92.0 12/31/2021 1304   MCH 30.2 12/31/2021 1304   MCHC 32.8 12/31/2021 1304   RDW 15.2 12/31/2021  1304   LYMPHSABS 2.1 12/31/2021 1304   MONOABS 0.5 12/31/2021 1304   EOSABS 0.2 12/31/2021 1304   BASOSABS 0.1 12/31/2021 1304    No results found for: "POCLITH", "LITHIUM"   No results found for: "PHENYTOIN", "PHENOBARB", "VALPROATE", "CBMZ"   .res Assessment: Plan:    Plan:  PDMP reviewed  1. Lexapro 20mg  daily  2. Clonazepam 1.5mg  at hs 3. Trazdone 100mg  - 2 at hs 4. Wellbutrin XL 450mg  daily. Denies seizure history. 5. Rexulti 0.5mg  daily - samples given - script sent and copay card given.  RTC 3 months  Patient advised to contact office with any questions, adverse effects, or acute worsening in signs and symptoms.  Discussed potential benefits, risk, and side effects of benzodiazepines to include potential risk of tolerance and dependence, as well as possible drowsiness.  Advised patient not to drive if experiencing drowsiness and to take lowest possible effective dose to minimize risk of dependence and tolerance.  Discussed potential metabolic side effects associated with atypical antipsychotics, as well as potential risk for movement side effects. Advised pt to contact office if movement side effects occur.    Diagnoses and all orders for this visit:  Major depressive disorder, recurrent episode, moderate (HCC) -     escitalopram (LEXAPRO) 20 MG tablet; Take 1 tablet (20 mg total) by mouth daily. -     buPROPion (WELLBUTRIN XL) 150 MG 24 hr tablet; TAKE THREE TABLETS EVERY MORNING.  Insomnia,  unspecified type -     traZODone (DESYREL) 100 MG tablet; Take 1-2 tablets (100-200 mg total) by mouth at bedtime.  Generalized anxiety disorder -     escitalopram (LEXAPRO) 20 MG tablet; Take 1 tablet (20 mg total) by mouth daily. -     clonazePAM (KLONOPIN) 1 MG tablet; TAKE ONE AND 1/2 TABLETS AT BEDTIME AS NEEDED FOR SLEEP.  PTSD (post-traumatic stress disorder) -     escitalopram (LEXAPRO) 20 MG tablet; Take 1 tablet (20 mg total) by mouth daily.     Please see After Visit Summary for patient specific instructions.  Future Appointments  Date Time Provider Department Center  02/04/2022  2:00 PM , [facility] None  07/03/2022  2:30 PM [facility] PHLEBOTOMIST [facility] None  07/03/2022  3:00 PM [place], MD [facility] None    No orders of the defined types were placed in this encounter.   -------------------------------

## 2022-02-04 ENCOUNTER — Ambulatory Visit: Payer: BC Managed Care – PPO | Admitting: Addiction (Substance Use Disorder)

## 2022-02-12 ENCOUNTER — Ambulatory Visit: Payer: BC Managed Care – PPO | Admitting: Adult Health

## 2022-02-13 ENCOUNTER — Other Ambulatory Visit (HOSPITAL_COMMUNITY): Payer: Self-pay

## 2022-02-17 ENCOUNTER — Other Ambulatory Visit (HOSPITAL_COMMUNITY): Payer: Self-pay

## 2022-03-14 ENCOUNTER — Ambulatory Visit: Payer: BC Managed Care – PPO | Admitting: Adult Health

## 2022-03-14 ENCOUNTER — Encounter: Payer: Self-pay | Admitting: Adult Health

## 2022-03-14 DIAGNOSIS — F4310 Post-traumatic stress disorder, unspecified: Secondary | ICD-10-CM | POA: Diagnosis not present

## 2022-03-14 DIAGNOSIS — F411 Generalized anxiety disorder: Secondary | ICD-10-CM

## 2022-03-14 DIAGNOSIS — F331 Major depressive disorder, recurrent, moderate: Secondary | ICD-10-CM | POA: Diagnosis not present

## 2022-03-14 DIAGNOSIS — G4700 Insomnia, unspecified: Secondary | ICD-10-CM | POA: Diagnosis not present

## 2022-03-14 NOTE — Progress Notes (Signed)
Monica [name] [id number] 27-Oct-1970 52 y.o.  Subjective:   Patient ID:  Monica [name] is a 52 y.o. (DOB [birth year]) female.  Chief Complaint: No chief complaint on file.   HPI Monica [name] presents to the office today for follow-up of MDD, GAD, PTSD and insomnia.  Describes mood today as "not good". Flat. Tearful throughout interview. Reports increased tearfulness. Mood symptoms - reports increased depression, anxiety, and irritability. Reports panic attacks. Reports increased worry, rumination, and over thinking. Reports mood has declined - "very lower". Stating "I feel sad, lonely and depressed -  empty".   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 Uncertain if infection is a precipitant for mood decline. Has not been able to work this week - "I just couldn't get myself out of bed to go". Feels like everything is a "struggle". Felt like medications were helpful initially, but is struggling. Unable to see therapist. Decreased interest and motivation. Taking medications as prescribed.  Energy levels lower. Active, has a regular exercise routine. Walking dog.  Enjoys some usual interests and activities. Single. Lives alone - with dog - daughter at [facility]. Spending time with family and friends.  Appetite increased - emotional eater. Weight gain. Working with [facility]. Sleeps well most nights. Averages 7 to 8 hours. Focus and concentration difficulties. Completing some tasks. Managing minimal aspects of household.  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  Denies SI or HI.  Denies AH or VH. Denies self harm. Denies substance use.  Previous medication trials: Adderall, Trazadone, Clonazepam, Paxil, Sertraline    AIMS    Flowsheet Row Admission (Discharged) from 04/02/2019 in [facility] Total Score 0      AUDIT    Flowsheet Row Admission (Discharged) from 04/02/2019 in [facility]  Alcohol  Use Disorder Identification Test Final Score (AUDIT) 0      Flowsheet Row Admission (Discharged) from 09/03/2020 in [facility] Admission (Discharged) from 04/02/2019 in [facility] ED from 04/01/2019 in [facility] DEPT  C-SSRS RISK CATEGORY No Risk Error: Q3, 4, or 5 should not be populated when Q2 is No High Risk        Review of Systems:  Review of Systems  Musculoskeletal:  Negative for gait problem.  Neurological:  Negative for tremors.  Psychiatric/Behavioral:         Please refer to HPI    Medications: I have reviewed the patient's current medications.  Current Outpatient Medications  Medication Sig Dispense Refill   Brexpiprazole (REXULTI) 0.5 MG TABS Take 1 tablet (0.5 mg total) by mouth daily. 30 tablet 5   buPROPion (WELLBUTRIN XL) 150 MG 24 hr tablet TAKE THREE TABLETS EVERY MORNING. 270 tablet 1   cetirizine (ZYRTEC) 10 MG tablet Take 10 mg by mouth daily.     clonazePAM (KLONOPIN) 1 MG tablet TAKE ONE AND 1/2 TABLETS AT BEDTIME AS NEEDED FOR SLEEP. 45 tablet 2   escitalopram (LEXAPRO) 20 MG tablet Take 1 tablet (20 mg total) by mouth daily. 90 tablet 1   fluticasone (FLONASE) 50 MCG/ACT nasal spray Place 1 spray daily into both nostrils.     Insulin Pen Needle (BD PEN NEEDLE NANO U/F) 32G X 4 MM MISC Use as directed with saxenda. 100 each 0   levonorgestrel (MIRENA) 20 MCG/DAY IUD 1 each by Intrauterine route once.     Liraglutide -Weight Management (SAXENDA) 18 MG/3ML SOPN Inject 0.6 MG into the skin daily  for 7 days-- 1.2 mg into the skin daily for 7 days-- 1.8 mg into the skin for 7 days-- 2.4 mg into the skin for 7 days-- then 3mg  daily 15 mL 5   traZODone (DESYREL) 100 MG tablet Take 1-2 tablets (100-200 mg total) by mouth at bedtime. 180 tablet 1   valACYclovir (VALTREX) 500 MG tablet Take 500 mg by mouth 2 (two) times daily as needed (flare up). (Patient not taking: Reported on 12/31/2021)     No current  facility-administered medications for this visit.    Medication Side Effects: None  Allergies:  Allergies  Allergen Reactions   Pristiq [Desvenlafaxine] Other (See Comments)    Not effective   Sertraline Other (See Comments)    Did not feel emotion   Pepto-Bismol [Bismuth Subsalicylate] Rash   Septra [Sulfamethoxazole-Trimethoprim] Rash   Sulfa Antibiotics Rash    Past Medical History:  Diagnosis Date   ADD (attention deficit disorder)    Cholelithiases    symptomatic   Environmental and seasonal allergies    GAD (generalized anxiety disorder)    Heart murmur    per pt told very faint , no symptoms, told nothing to worry about;  pt had echo done 12-24-2011 in epic ef 55-60% and trace MR   Herpes simplex type 1 infection    History of MRSA infection    left great toe in 2010   History of palpitations    ED visit in epic 05/ 2017;  evaluated by cardiology--- dr Mamie Nick. Har[facility] Challenger note in epic 08-31-2015 , ST on EKG , no further work-up;  pt had previou echo in epic 12-24-2011 ef 55-60%   History of suicidal behavior 03/2019   [id number] admission for IVC   Insomnia    unspecified   PTSD (post-traumatic stress disorder)    Wears glasses     Past Medical History, Surgical history, Social history, and Family history were reviewed and updated as appropriate.   Please see review of systems for further details on the patient's review from today.   Objective:   Physical Exam:  There were no vitals taken for this visit.  Physical Exam Constitutional:      General: She is not in acute distress. Musculoskeletal:        General: No deformity.  Neurological:     Mental Status: She is alert and oriented to person, place, and time.     Coordination: Coordination normal.  Psychiatric:        Attention and Perception: Attention and perception normal. She does not perceive auditory or visual hallucinations.        Mood and Affect: Mood normal. Mood is not anxious or depressed. Affect is not  labile, blunt, angry or inappropriate.        Speech: Speech normal.        Behavior: Behavior normal.        Thought Content: Thought content normal. Thought content is not paranoid or delusional. Thought content does not include homicidal or suicidal ideation. Thought content does not include homicidal or suicidal plan.        Cognition and Memory: Cognition and memory normal.        Judgment: Judgment normal.     Comments: Insight intact     Lab Review:     Component Value Date/Time   NA 137 04/01/2019 2236   K 3.3 (L) 04/01/2019 2236   CL 101 04/01/2019 2236   CO2 26 04/01/2019 2236   GLUCOSE 108 (H) 04/01/2019  2236   BUN 7 04/01/2019 2236   CREATININE 0.84 04/01/2019 2236   CALCIUM 8.8 (L) 04/01/2019 2236   PROT 7.5 04/01/2019 2236   ALBUMIN 3.9 04/01/2019 2236   AST 20 04/01/2019 2236   ALT 14 04/01/2019 2236   ALKPHOS 36 (L) 04/01/2019 2236   BILITOT 0.4 04/01/2019 2236   GFRNONAA >60 04/01/2019 2236   GFRAA >60 04/01/2019 2236       Component Value Date/Time   WBC 6.6 12/31/2021 1304   WBC 8.5 04/01/2019 2236   RBC 4.01 12/31/2021 1304   HGB 12.1 12/31/2021 1304   HCT 36.9 12/31/2021 1304   PLT 506 (H) 12/31/2021 1304   MCV 92.0 12/31/2021 1304   MCH 30.2 12/31/2021 1304   MCHC 32.8 12/31/2021 1304   RDW 15.2 12/31/2021 1304   LYMPHSABS 2.1 12/31/2021 1304   MONOABS 0.5 12/31/2021 1304   EOSABS 0.2 12/31/2021 1304   BASOSABS 0.1 12/31/2021 1304    No results found for: "POCLITH", "LITHIUM"   No results found for: "PHENYTOIN", "PHENOBARB", "VALPROATE", "CBMZ"   .res Assessment: Plan:    Plan:  PDMP reviewed  1. Lexapro 20mg  daily  2. Clonazepam 1.5mg  at hs 3. Trazdone 100mg  - 2 at hs 4. Wellbutrin XL 450mg  daily. Denies seizure history. 5. Increase Rexulti 0.5mg  to 1 mgdaily - samples given - samples given  Patient out of work from 03/12/2021 through 04/11/2021.   Plans to reach out to [facility] for therapy options.  RTC 2  weeks  Patient advised to contact office with any questions, adverse effects, or acute worsening in signs and symptoms.  Discussed potential benefits, risk, and side effects of benzodiazepines to include potential risk of tolerance and dependence, as well as possible drowsiness.  Advised patient not to drive if experiencing drowsiness and to take lowest possible effective dose to minimize risk of dependence and tolerance.  Discussed potential metabolic side effects associated with atypical antipsychotics, as well as potential risk for movement side effects. Advised pt to contact office if movement side effects occur.   There are no diagnoses linked to this encounter.   Please see After Visit Summary for patient specific instructions.  Future Appointments  Date Time Provider [place]  03/18/2022  4:00 PM Jannifer Hick [facility] None  04/01/2022  4:40 PM Valeta Paz, Berdie Ogren, NP CP-CP None  04/29/2022  4:20 PM Zoey Gilkeson, Berdie Ogren, NP CP-CP None  07/03/2022  2:30 PM [facility] PHLEBOTOMIST [facility] None  07/03/2022  3:00 PM Ladell Pier, MD [facility] None    No orders of the defined types were placed in this encounter.   -------------------------------

## 2022-03-18 ENCOUNTER — Ambulatory Visit: Payer: BC Managed Care – PPO | Admitting: Behavioral Health

## 2022-03-18 ENCOUNTER — Telehealth: Payer: Self-pay | Admitting: Adult Health

## 2022-03-18 DIAGNOSIS — F331 Major depressive disorder, recurrent, moderate: Secondary | ICD-10-CM

## 2022-03-18 DIAGNOSIS — F411 Generalized anxiety disorder: Secondary | ICD-10-CM | POA: Diagnosis not present

## 2022-03-18 NOTE — Telephone Encounter (Addendum)
Pt brought in [place] / Disability forms to complete. Placed in Traci's box

## 2022-03-18 NOTE — Progress Notes (Signed)
[facility] Counselor/Therapist Progress Note  Patient ID: Monica [name], MRN: [id number],    Date: 03/24/2022  Time Spent: 50 minutes  Treatment Type: Individual Therapy  Reported Symptoms: The patient reports "Just like sadness, emptiness I was doing really well. I don't know if the holidays triggered it, but then I couldn't get my thoughts straight and couldn't focus at work. Depression which I haven't felt like this in a while".   Mental Status Exam:  Appearance:   Casual     Behavior:  Sharing  Motor:  Normal  Speech/Language:   Clear and Coherent  Affect:  Depressed  Mood:  anxious, depressed, and sad  Thought process:  normal  Thought content:    WNL  Sensory/Perceptual disturbances:    WNL  Orientation:  oriented to person  Attention:  Good  Concentration:  Good  Memory:  WNL  Fund of knowledge:   Good  Insight:    Good  Judgment:   Good  Impulse Control:  Good   Risk Assessment: Danger to Self:  No Self-injurious Behavior: No Danger to Others: No Duty to Warn:no Physical Aggression / Violence:No  Access to Firearms a concern: No  Gang Involvement:No   Subjective: The patient presents as a patient of another counselor at [facility] whom is currently out on leave. The patient reports plans to attend [facility] partial hospitalization voluntarily for three weeks and then intensive outpatient for three weeks. She reports her medication dosage of Rexulti was increase by 1 mg. The patient reports her last hospitalization as January 2021. She states at that time it was an involuntary commitment. She reports feeling anxiety due to soon going to [place]. She states she is attempting to encourage herself.   She reports looking forward to gaining coping skills while participating in partial hospitalization. She reports to currently utilize deep breathing technique. She states she had an anxiety attack prior to attending her med management  appointment. She reports she has not experienced an anxiety attack since 2021. She reports belief what may have triggered her anxiety attack is the holidays and memories of the past. She reports she does not remember what coping skills to utilize when she is experiencing depression. She rated her anxiety today at a 3 and at the end of the session she rated her anxiety at a 1 and her depression at 5 today on a scale of 1 to 10 with 10 being severe. 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 However, she reports she is unable to implement and utilize coping skills for herself due to forgetfulness. She reports recently engaging into a nutrition program, however she states she has discontinued her prescribed medication to assist with her with weight loss and she has discontinued her participation in the nutrition program since November. Although, she reports plans to return back to the nutrition program soon. The patient identified her support system as her sister, her brother and her pet dog as her emotional support. She states plans to implement coping skills of grounding techniques, and developing a coping skills box to help her remember to utilize healthy coping strategies to assist her with maintaining mood stability. She reports to currently utilize deep breathing technique. She reports interest with receiving therapy weekly once a week with this counselor. She denied SI/HI, AH/VH. The patient agree to have a Optometrist shadow today's session.  The counselor introduced herself to the patient. The  counselor discussed the patients mental health symptoms and coping strategies utilized. The counselor questioned triggers for the patients mental health symptoms. The counselor requested the patient rate her mental health symptoms. The counselor processed in session the patients anxiety level experienced due to her report that she will soon be admitted into a  partial hospitalization program and intensive outpatient treatment after she is discharged from the partial hospitalization program. The  counselor discussed the patients support system. The counselor discussed utilizing positive self talk, affirmations, and developing a coping skills box to help her remember to utilize healthy coping strategies to assist her with managing her mental health symptoms. The counselor discussed and encouraged the patient to develop a mental health relapse prevention plan to assist her with avoiding decompensation. The counselor assessed for SI/HI, AH/VH. The counselor asked permission from the patient to allow a student learner to shadow today's session.     Interventions: Mindfulness Meditation, Motivational Interviewing, and Solution-Oriented/Positive Psychology  Diagnosis:   ICD-10-CM   1. Major depressive disorder, recurrent episode, moderate (HCC)  F33.1     2. Generalized anxiety disorder  F41.1       Plan:   Client to return for weekly therapy with Sammuel Cooper, therapist, to review again in 6 months.  Client is to being seeing medication provider for support of mood management for depression, passive SI, and insomnia.    Client to engage in CBT: challenging negative internal ruminations and self-talk AEB journaling daily or expressing thoughts to support persons in their life and then challenging it with truth.  Client to engage in tapping to tap in healthy cognition that challenges negative rumination or deep negative core belief.  Client to practice DBT distress tolerance skills to decrease crying spells and thoughts of not being able to endure their suffering AEB using mindfulness, deep breathing, and TIP to increase tolerance for discomfort, discharge emotional distress, and increase their understanding that they can do hard things.  Client to utilize BSP (brainspotting) with therapist to help client identify and process triggers for her her depression,  anxiety, and abandonment with goal of reducing said SUDs caused by depression/anxiety by 33% in the next 6 months.  Client to work on self-acceptance to increase her self-esteem despite her weight changes.  Progress: Client processed progress in her weight loss journey and also in her thoughts about her body and her inner beauty that are contributing to more kind self talk.  Client made progress choosing to see her family regardless of her weight, not giving in to the fears of judgment.   Monica [name], [facility]

## 2022-03-18 NOTE — Telephone Encounter (Signed)
Noted. Ty!

## 2022-03-23 NOTE — Telephone Encounter (Signed)
Paper work completed. Need to confirm her dates out of work with her treatment

## 2022-03-24 ENCOUNTER — Encounter: Payer: Self-pay | Admitting: Behavioral Health

## 2022-03-24 ENCOUNTER — Telehealth: Payer: Self-pay

## 2022-03-24 DIAGNOSIS — Z0289 Encounter for other administrative examinations: Secondary | ICD-10-CM

## 2022-03-24 NOTE — Telephone Encounter (Signed)
Pt called back to confirm date out of work is 03/12/2022, she starts treatment today 03/24/2022 at [facility] for 35 days. Estimated end date is 05/11/2022 and RTW on 05/12/2022, Monday.   Pt will pick up forms tomorrow.

## 2022-03-27 ENCOUNTER — Ambulatory Visit: Payer: BC Managed Care – PPO | Admitting: Behavioral Health

## 2022-04-01 ENCOUNTER — Ambulatory Visit: Payer: BC Managed Care – PPO | Admitting: Adult Health

## 2022-04-03 ENCOUNTER — Telehealth: Payer: Self-pay | Admitting: Adult Health

## 2022-04-03 ENCOUNTER — Ambulatory Visit: Payer: BC Managed Care – PPO | Admitting: Behavioral Health

## 2022-04-03 NOTE — Telephone Encounter (Signed)
Received fax from [facility], forms to complete and fax back. Placed in Traci's box

## 2022-04-03 NOTE — Telephone Encounter (Signed)
Noted. Ty!

## 2022-04-08 DIAGNOSIS — Z0289 Encounter for other administrative examinations: Secondary | ICD-10-CM

## 2022-04-10 ENCOUNTER — Ambulatory Visit: Payer: BC Managed Care – PPO | Admitting: Behavioral Health

## 2022-04-23 ENCOUNTER — Ambulatory Visit (INDEPENDENT_AMBULATORY_CARE_PROVIDER_SITE_OTHER): Payer: BC Managed Care – PPO | Admitting: Adult Health

## 2022-04-23 ENCOUNTER — Encounter: Payer: Self-pay | Admitting: Adult Health

## 2022-04-23 DIAGNOSIS — F4310 Post-traumatic stress disorder, unspecified: Secondary | ICD-10-CM | POA: Diagnosis not present

## 2022-04-23 DIAGNOSIS — F331 Major depressive disorder, recurrent, moderate: Secondary | ICD-10-CM | POA: Diagnosis not present

## 2022-04-23 DIAGNOSIS — G4700 Insomnia, unspecified: Secondary | ICD-10-CM | POA: Diagnosis not present

## 2022-04-23 DIAGNOSIS — F411 Generalized anxiety disorder: Secondary | ICD-10-CM | POA: Diagnosis not present

## 2022-04-23 MED ORDER — GABAPENTIN 300 MG PO CAPS: 300.0000 mg | ORAL_CAPSULE | Freq: Two times a day (BID) | ORAL | 2 refills | Status: DC

## 2022-04-23 MED ORDER — BREXPIPRAZOLE 1 MG PO TABS: 1.0000 mg | ORAL_TABLET | Freq: Every day | ORAL | 5 refills | Status: DC

## 2022-04-23 MED ORDER — REXULTI 0.5 MG PO TABS: ORAL_TABLET | ORAL | 5 refills | Status: DC

## 2022-04-23 NOTE — Progress Notes (Signed)
Monica [name] [id number] 09-08-1970 52 y.o.  Subjective:   Patient ID:  Monica [name] is a 52 y.o. (DOB Apr 09, 1970) female.  Chief Complaint: No chief complaint on file.   HPI Monica [name] presents to the office today for follow-up of MDD, GAD, PTSD and insomnia.  Describes mood today as "lower". Flat. Tearful. Reports increased tearfulness. Mood symptoms - reports increased depression, anxiety, and irritability. Reports panic attacks. Reports worry, rumination, and over thinking. Reports mood as "low". Stating "I feel like I'm making progress". Feels like medication changes have been helpful. Involved in a daily IOP program as well as individual therapy. Decreased interest and motivation. Taking medications as prescribed. Unable to return to work setting at this time.  Energy levels lower. Active, does not have a regular exercise routine.   Enjoys some usual interests and activities. Single. Lives alone - with dog - daughter in school at [employer]. Spending time with family and friends.  Appetite increased - emotional eater. Weight gain.   Sleep is variable. Averages 8 hours of broken loss. Focus and concentration difficulties. Completing some tasks. Managing minimal aspects of household.  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  Denies SI or HI.  Denies AH or VH. Denies self harm. Denies substance use.  Previous medication trials: Adderall, Trazadone, Clonazepam, Paxil, Sertraline   Flowsheet Row Admission (Discharged) from 09/03/2020 in [facility] ED from 04/01/2019 in [facility] Emergency Department at [place] No Risk High Risk        Review of Systems:  Review of Systems  Musculoskeletal:  Negative for gait problem.  Neurological:  Negative for tremors.  Psychiatric/Behavioral:         Please refer to HPI    Medications: I have reviewed the patient's current medications.  Current Outpatient Medications   Medication Sig Dispense Refill   Brexpiprazole (REXULTI) 0.5 MG TABS Take 1 tablet (0.5 mg total) by mouth daily. 30 tablet 5   buPROPion (WELLBUTRIN XL) 150 MG 24 hr tablet TAKE THREE TABLETS EVERY MORNING. 270 tablet 1   cetirizine (ZYRTEC) 10 MG tablet Take 10 mg by mouth daily.     clonazePAM (KLONOPIN) 1 MG tablet TAKE ONE AND 1/2 TABLETS AT BEDTIME AS NEEDED FOR SLEEP. 45 tablet 2   escitalopram (LEXAPRO) 20 MG tablet Take 1 tablet (20 mg total) by mouth daily. 90 tablet 1   fluticasone (FLONASE) 50 MCG/ACT nasal spray Place 1 spray daily into both nostrils.     Insulin Pen Needle (BD PEN NEEDLE NANO U/F) 32G X 4 MM MISC Use as directed with saxenda. 100 each 0   levonorgestrel (MIRENA) 20 MCG/DAY IUD 1 each by Intrauterine route once.     Liraglutide -Weight Management (SAXENDA) 18 MG/3ML SOPN Inject 0.6 MG into the skin daily for 7 days-- 1.2 mg into the skin daily for 7 days-- 1.8 mg into the skin for 7 days-- 2.4 mg into the skin for 7 days-- then 5m daily 15 mL 5   traZODone (DESYREL) 100 MG tablet Take 1-2 tablets (100-200 mg total) by mouth at bedtime. 180 tablet 1   valACYclovir (VALTREX) 500 MG tablet Take 500 mg by mouth 2 (two) times daily as needed (flare up). (Patient not taking: Reported on 12/31/2021)     No current facility-administered medications for this visit.    Medication Side Effects: None  Allergies:  Allergies  Allergen Reactions   Pristiq [Desvenlafaxine] Other (See Comments)  Not effective   Sertraline Other (See Comments)    Did not feel emotion   Pepto-Bismol [Bismuth Subsalicylate] Rash   Septra [Sulfamethoxazole-Trimethoprim] Rash   Sulfa Antibiotics Rash    Past Medical History:  Diagnosis Date   ADD (attention deficit disorder)    Cholelithiases    symptomatic   Environmental and seasonal allergies    GAD (generalized anxiety disorder)    Heart murmur    per pt told very faint , no symptoms, told nothing to worry about;  pt had echo  done 12-24-2011 in epic ef 55-60% and trace MR   Herpes simplex type 1 infection    History of MRSA infection    left great toe in 2010   History of palpitations    ED visit in epic 05/ 2017;  evaluated by cardiology--- dr Mamie Nick. Harrington Challenger note in epic 08-31-2015 , ST on EKG , no further work-up;  pt had previou echo in epic 12-24-2011 ef 55-60%   History of suicidal behavior 03/2019   [facility] admission for IVC   Insomnia    unspecified   PTSD (post-traumatic stress disorder)    Wears glasses     Past Medical History, Surgical history, Social history, and Family history were reviewed and updated as appropriate.   Please see review of systems for further details on the patient's review from today.   Objective:   Physical Exam:  There were no vitals taken for this visit.  Physical Exam Constitutional:      General: She is not in acute distress. Musculoskeletal:        General: No deformity.  Neurological:     Mental Status: She is alert and oriented to person, place, and time.     Coordination: Coordination normal.  Psychiatric:        Attention and Perception: Attention and perception normal. She does not perceive auditory or visual hallucinations.        Mood and Affect: Mood normal. Mood is not anxious or depressed. Affect is not labile, blunt, angry or inappropriate.        Speech: Speech normal.        Behavior: Behavior normal.        Thought Content: Thought content normal. Thought content is not paranoid or delusional. Thought content does not include homicidal or suicidal ideation. Thought content does not include homicidal or suicidal plan.        Cognition and Memory: Cognition and memory normal.        Judgment: Judgment normal.     Comments: Insight intact     Lab Review:     Component Value Date/Time   NA 137 04/01/2019 2236   K 3.3 (L) 04/01/2019 2236   CL 101 04/01/2019 2236   CO2 26 04/01/2019 2236   GLUCOSE 108 (H) 04/01/2019 2236   BUN 7 04/01/2019 2236    CREATININE 0.84 04/01/2019 2236   CALCIUM 8.8 (L) 04/01/2019 2236   PROT 7.5 04/01/2019 2236   ALBUMIN 3.9 04/01/2019 2236   AST 20 04/01/2019 2236   ALT 14 04/01/2019 2236   ALKPHOS 36 (L) 04/01/2019 2236   BILITOT 0.4 04/01/2019 2236   GFRNONAA >60 04/01/2019 2236   GFRAA >60 04/01/2019 2236       Component Value Date/Time   WBC 6.6 12/31/2021 1304   WBC 8.5 04/01/2019 2236   RBC 4.01 12/31/2021 1304   HGB 12.1 12/31/2021 1304   HCT 36.9 12/31/2021 1304   PLT 506 (H) 12/31/2021 1304  MCV 92.0 12/31/2021 1304   MCH 30.2 12/31/2021 1304   MCHC 32.8 12/31/2021 1304   RDW 15.2 12/31/2021 1304   LYMPHSABS 2.1 12/31/2021 1304   MONOABS 0.5 12/31/2021 1304   EOSABS 0.2 12/31/2021 1304   BASOSABS 0.1 12/31/2021 1304    No results found for: "POCLITH", "LITHIUM"   No results found for: "PHENYTOIN", "PHENOBARB", "VALPROATE", "CBMZ"   .res Assessment: Plan:    Plan:  PDMP reviewed  1. Lexapro 109m daily  2. Clonazepam 1.515mat hs 3. Trazdone 10066m 2 at hs 4. Wellbutrin XL 450m46mily. Denies seizure history. 5. Rexulti 1 mg daily - samples given - samples given 6. Gabapentin 300mg69mly to BID  Patient out of work from 03/12/2021 through 06/11/2021.   Working with [employer] - 5 days a week. Also seeing an individual therapist twice a week.   RTC 4 weeks  Patient advised to contact office with any questions, adverse effects, or acute worsening in signs and symptoms.  Discussed potential benefits, risk, and side effects of benzodiazepines to include potential risk of tolerance and dependence, as well as possible drowsiness.  Advised patient not to drive if experiencing drowsiness and to take lowest possible effective dose to minimize risk of dependence and tolerance.  Discussed potential metabolic side effects associated with atypical antipsychotics, as well as potential risk for movement side effects. Advised pt to contact office if movement side  effects occur.    There are no diagnoses linked to this encounter.   Please see After Visit Summary for patient specific instructions.  Future Appointments  Date Time Provider DeparTichigan5/2024  2:30 PM [facility] PHLEBOTOMIST [facility] None  07/03/2022  3:00 PM [facility]Ladell [facility] None    No orders of the defined types were placed in this encounter.   -------------------------------

## 2022-04-29 ENCOUNTER — Ambulatory Visit: Payer: BC Managed Care – PPO | Admitting: Adult Health

## 2022-05-01 ENCOUNTER — Other Ambulatory Visit: Payer: Self-pay

## 2022-05-04 ENCOUNTER — Other Ambulatory Visit: Payer: Self-pay | Admitting: Adult Health

## 2022-05-04 DIAGNOSIS — F411 Generalized anxiety disorder: Secondary | ICD-10-CM

## 2022-05-16 ENCOUNTER — Ambulatory Visit: Payer: BC Managed Care – PPO | Admitting: Behavioral Health

## 2022-05-19 ENCOUNTER — Encounter: Payer: Self-pay | Admitting: Addiction (Substance Use Disorder)

## 2022-05-19 ENCOUNTER — Encounter (INDEPENDENT_AMBULATORY_CARE_PROVIDER_SITE_OTHER): Payer: Self-pay | Admitting: Family Medicine

## 2022-05-19 ENCOUNTER — Ambulatory Visit (INDEPENDENT_AMBULATORY_CARE_PROVIDER_SITE_OTHER): Payer: BC Managed Care – PPO | Admitting: Family Medicine

## 2022-05-19 VITALS — BP 94/65 | HR 98 | Temp 98.2°F | Ht 66.0 in | Wt 212.0 lb

## 2022-05-19 DIAGNOSIS — Z0289 Encounter for other administrative examinations: Secondary | ICD-10-CM

## 2022-05-19 DIAGNOSIS — Z6834 Body mass index (BMI) 34.0-34.9, adult: Secondary | ICD-10-CM

## 2022-05-19 DIAGNOSIS — F5081 Binge eating disorder: Secondary | ICD-10-CM | POA: Insufficient documentation

## 2022-05-19 DIAGNOSIS — E669 Obesity, unspecified: Secondary | ICD-10-CM

## 2022-05-19 DIAGNOSIS — F32A Depression, unspecified: Secondary | ICD-10-CM | POA: Diagnosis not present

## 2022-05-19 DIAGNOSIS — F509 Eating disorder, unspecified: Secondary | ICD-10-CM | POA: Diagnosis not present

## 2022-05-19 DIAGNOSIS — F5089 Other specified eating disorder: Secondary | ICD-10-CM | POA: Insufficient documentation

## 2022-05-19 DIAGNOSIS — F419 Anxiety disorder, unspecified: Secondary | ICD-10-CM | POA: Diagnosis not present

## 2022-05-19 NOTE — Assessment & Plan Note (Signed)
Patient has a long history of disordered eating with underlying anxiety and depression.  She lacks a good support system at home.  She is currently in counseling and open minded to adding in cognitive behavioral therapy with Dr. Mallie Mussel.  We will plan to focus on stress reduction, eating on a schedule and keeping healthy foods at home with meal planning

## 2022-05-19 NOTE — Progress Notes (Signed)
Office: [phone]  /  Fax: [phone]   Initial Visit  Monica [name] was seen in clinic today to evaluate for obesity. She is interested in losing weight to improve overall health and reduce the risk of weight related complications. She presents today to review program treatment options, initial physical assessment, and evaluation.     She was referred by: Friend or Family  When asked what else they would like to accomplish? She states: Improve appearance and Improve self-confidence  would like to be 170 lb.  Weight history:  started to gain weight at age 85.  Weight has been up and down for years.  She fluctuates from 165- 223 lb.  Long hx of emotional eating.    When asked how has your weight affected you? She states: Having fatigue and Problems with depression and or anxiety  Some associated conditions: None  Contributing factors: Eating patterns and Mental health problems  Weight promoting medications identified: None  Current nutrition plan: Portion control / smart choices  Current level of physical activity: Walking  Current or previous pharmacotherapy: GLP-1 and Other: Naltrexone  on Saxenda 3.0 mg daily (restarted one month ago)  Response to medication: Lost weight and was able to maintain weight loss   Past medical history includes:   Past Medical History:  Diagnosis Date   ADD (attention deficit disorder)    Cholelithiases    symptomatic   Environmental and seasonal allergies    GAD (generalized anxiety disorder)    Heart murmur    per pt told very faint , no symptoms, told nothing to worry about;  pt had echo done 12-24-2011 in epic ef 55-60% and trace MR   Herpes simplex type 1 infection    History of MRSA infection    left great toe in 2010   History of palpitations    ED visit in epic 05/ 2017;  evaluated by cardiology--- dr Mamie Nick. Harrington Challenger note in epic 08-31-2015 , ST on EKG , no further work-up;  pt had previou echo in epic 12-24-2011 ef 55-60%   History  of suicidal behavior 03/2019   [facility] admission for IVC   Insomnia    unspecified   PTSD (post-traumatic stress disorder)    Wears glasses      Objective:   BP 94/65   Pulse 98   Temp 98.2 F (36.8 C)   Ht '5\' 6"'$  (1.676 m)   Wt 212 lb (96.2 kg)   SpO2 95%   BMI 34.22 kg/m  She was weighed on the bioimpedance scale: Body mass index is 34.22 kg/m.  Peak [id number] , Body Fat%:45.3, Visceral Fat Rating:12, Weight trend over the last 12 months: Decreasing  General:  Alert, oriented and cooperative. Patient is in no acute distress.  Respiratory: Normal respiratory effort, no problems with respiration noted   Gait: able to ambulate independently  Mental Status: Normal mood and affect. Normal behavior. Normal judgment and thought content.   DIAGNOSTIC DATA REVIEWED:  BMET    Component Value Date/Time   NA 137 04/01/2019 2236   K 3.3 (L) 04/01/2019 2236   CL 101 04/01/2019 2236   CO2 26 04/01/2019 2236   GLUCOSE 108 (H) 04/01/2019 2236   BUN 7 04/01/2019 2236   CREATININE 0.84 04/01/2019 2236   CALCIUM 8.8 (L) 04/01/2019 2236   GFRNONAA >60 04/01/2019 2236   GFRAA >60 04/01/2019 2236   No results found for: "HGBA1C" No results found for: "INSULIN" CBC    Component Value Date/Time  WBC 6.6 12/31/2021 1304   WBC 8.5 04/01/2019 2236   RBC 4.01 12/31/2021 1304   HGB 12.1 12/31/2021 1304   HCT 36.9 12/31/2021 1304   PLT 506 (H) 12/31/2021 1304   MCV 92.0 12/31/2021 1304   MCH 30.2 12/31/2021 1304   MCHC 32.8 12/31/2021 1304   RDW 15.2 12/31/2021 1304   Iron/TIBC/Ferritin/ %Sat    Component Value Date/Time   FERRITIN 74 12/31/2021 1304   Lipid Panel  No results found for: "CHOL", "TRIG", "HDL", "CHOLHDL", "VLDL", "LDLCALC", "LDLDIRECT" Hepatic Function Panel     Component Value Date/Time   PROT 7.5 04/01/2019 2236   ALBUMIN 3.9 04/01/2019 2236   AST 20 04/01/2019 2236   ALT 14 04/01/2019 2236   ALKPHOS 36 (L) 04/01/2019 2236   BILITOT 0.4 04/01/2019 2236       Component Value Date/Time   TSH 1.131 04/03/2019 0630     Assessment and Plan:   Anxiety and depression Assessment & Plan: Managed by psychiatry and in therapy taking Lexapro 20 mg once daily, Wellbutrin XL 150 mg daily and trazodone 100 mg at bedtime  Long history of emotional eating Plan to add in cognitive behavioral therapy with Dr. Mallie Mussel   Generalized obesity Assessment & Plan: Reviewed program information and current bioimpedance results.  Encouraged adequate protein intake since she is on Saxenda 3 mg once daily injection with a goal roughly around 1 g/kg of body weight.  Will set up a new patient visit with fasting labs, EKG and indirect calorimetry   BMI 34.0-34.9,adult  Eating disorder, unspecified type Assessment & Plan: Patient has a long history of disordered eating with underlying anxiety and depression.  She lacks a good support system at home.  She is currently in counseling and open minded to adding in cognitive behavioral therapy with Dr. Mallie Mussel.  We will plan to focus on stress reduction, eating on a schedule and keeping healthy foods at home with meal planning         Obesity Treatment / Action Plan:  Patient will work on garnering support from family and friends to begin weight loss journey. Will work on eliminating or reducing the presence of highly palatable, calorie dense foods in the home. Will complete provided nutritional and psychosocial assessment questionnaire before the next appointment. Will be scheduled for indirect calorimetry to determine resting energy expenditure in a fasting state.  This will allow [place] to create a reduced calorie, high-protein meal plan to promote loss of fat mass while preserving muscle mass. Will avoid skipping meals which may result in increased hunger signals and overeating at certain times. Was counseled on nutritional approaches to weight loss and benefits of complex carbs and high quality protein as part of  nutritional weight management. Was counseled on pharmacotherapy and role as an adjunct in weight management.   Obesity Education Performed Today:  She was weighed on the bioimpedance scale and results were discussed and documented in the synopsis.  We discussed obesity as a disease and the importance of a more detailed evaluation of all the factors contributing to the disease.  We discussed the importance of long term lifestyle changes which include nutrition, exercise and behavioral modifications as well as the importance of customizing this to her specific health and social needs.  We discussed the benefits of reaching a healthier weight to alleviate the symptoms of existing conditions and reduce the risks of the biomechanical, metabolic and psychological effects of obesity.  RHEMA LOYAL appears to be in the  action stage of change and states they are ready to start intensive lifestyle modifications and behavioral modifications.  30 minutes was spent today on this visit including the above counseling, pre-visit chart review, and post-visit documentation.  Reviewed by clinician on day of visit: allergies, medications, problem list, medical history, surgical history, family history, social history, and previous encounter notes pertinent to obesity diagnosis.    Loyal Gambler, D.O. DABFM, DABOM [facility] [phone] [place], [place] [address] [phone]

## 2022-05-19 NOTE — Assessment & Plan Note (Signed)
Reviewed program information and current bioimpedance results.  Encouraged adequate protein intake since she is on Saxenda 3 mg once daily injection with a goal roughly around 1 g/kg of body weight.  Will set up a new patient visit with fasting labs, EKG and indirect calorimetry

## 2022-05-19 NOTE — Assessment & Plan Note (Signed)
Managed by psychiatry and in therapy taking Lexapro 20 mg once daily, Wellbutrin XL 150 mg daily and trazodone 100 mg at bedtime  Long history of emotional eating Plan to add in cognitive behavioral therapy with Dr. Mallie Mussel

## 2022-05-21 ENCOUNTER — Encounter: Payer: Self-pay | Admitting: Adult Health

## 2022-05-21 ENCOUNTER — Telehealth: Payer: Self-pay | Admitting: Adult Health

## 2022-05-21 ENCOUNTER — Ambulatory Visit: Payer: BC Managed Care – PPO | Admitting: Adult Health

## 2022-05-21 DIAGNOSIS — F331 Major depressive disorder, recurrent, moderate: Secondary | ICD-10-CM

## 2022-05-21 DIAGNOSIS — F411 Generalized anxiety disorder: Secondary | ICD-10-CM

## 2022-05-21 DIAGNOSIS — F4310 Post-traumatic stress disorder, unspecified: Secondary | ICD-10-CM | POA: Diagnosis not present

## 2022-05-21 DIAGNOSIS — G4700 Insomnia, unspecified: Secondary | ICD-10-CM | POA: Diagnosis not present

## 2022-05-21 NOTE — Progress Notes (Signed)
MARYLON YONKE [id number] 19-Dec-1970 52 y.o.  Subjective:   Patient ID:  Monica [name] is a 52 y.o. (DOB March 10, 1971) female.  Chief Complaint: No chief complaint on file.   HPI DANYALE MAYALL presents to the office today for follow-up of MDD, GAD, PTSD and insomnia.  Describes mood today as "improved". Decreased tearfulness. Mood symptoms - reports decreased depression. Reports increased anxiety and irritability - situational stressors. Reports decreased panic attacks. Reports some worry, rumination, and over thinking. Reports mood as "improved - not as low as it had been". Stating "I feel like I'm doing better as far as working on myself". Has completed IOP and PHP at [facility]. Plans to start working with healthy weight and wellness. Feels like current medication regimen is helpful. Improved interest and motivation. Taking medications as prescribed.  Energy levels improved. Active, does not have a regular exercise routine. Walking daily. Enjoys some usual interests and activities. Single. Lives alone - with dog - daughter in school at [employer]. Spending time with family and friends.  Appetite decreased. Weight loss - started on Saxenda. Sleep is variable. Averages 8 hours - up and down during the night. Focus and concentration difficulties. Completing some tasks. Managing minimal aspects of household.  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  Denies SI or HI.  Denies AH or VH. Denies self harm. Denies substance use.  Previous medication trials: Adderall, Trazadone, Clonazepam, Paxil, Sertraline   Flowsheet Row Admission (Discharged) from 09/03/2020 in [facility] ED from 04/01/2019 in [facility] Emergency Department at [place] No Risk High Risk        Review of Systems:  Review of Systems  Musculoskeletal:  Negative for gait problem.  Neurological:  Negative for tremors.  Psychiatric/Behavioral:          Please refer to HPI    Medications: I have reviewed the patient's current medications.  Current Outpatient Medications  Medication Sig Dispense Refill   Brexpiprazole (REXULTI) 0.5 MG TABS Take one tablet daily. 30 tablet 5   brexpiprazole (REXULTI) 1 MG TABS tablet Take 1 tablet (1 mg total) by mouth daily. 30 tablet 5   buPROPion (WELLBUTRIN XL) 150 MG 24 hr tablet TAKE THREE TABLETS EVERY MORNING. 270 tablet 1   cetirizine (ZYRTEC) 10 MG tablet Take 10 mg by mouth daily.     clonazePAM (KLONOPIN) 1 MG tablet TAKE ONE AND 1/2 TABLETS AT BEDTIME AS NEEDED FOR SLEEP. 45 tablet 2   escitalopram (LEXAPRO) 20 MG tablet Take 1 tablet (20 mg total) by mouth daily. 90 tablet 1   fluticasone (FLONASE) 50 MCG/ACT nasal spray Place 1 spray daily into both nostrils.     gabapentin (NEURONTIN) 300 MG capsule Take 1 capsule (300 mg total) by mouth 2 (two) times daily. 60 capsule 2   Insulin Pen Needle (BD PEN NEEDLE NANO U/F) 32G X 4 MM MISC Use as directed with saxenda. 100 each 0   levonorgestrel (MIRENA) 20 MCG/DAY IUD 1 each by Intrauterine route once.     liothyronine (CYTOMEL) 5 MCG tablet 2 tablet on an empty stomach Oral once a day     Liraglutide -Weight Management (SAXENDA) 18 MG/3ML SOPN Inject 0.6 MG into the skin daily for 7 days-- 1.2 mg into the skin daily for 7 days-- 1.8 mg into the skin for 7 days-- 2.4 mg into the skin for 7 days-- then '3mg'$  daily 15 mL 5   traZODone (DESYREL) 100  MG tablet Take 1-2 tablets (100-200 mg total) by mouth at bedtime. 180 tablet 1   valACYclovir (VALTREX) 500 MG tablet Take 500 mg by mouth 2 (two) times daily as needed (flare up).     No current facility-administered medications for this visit.    Medication Side Effects: None  Allergies:  Allergies  Allergen Reactions   Pristiq [Desvenlafaxine] Other (See Comments)    Not effective   Sertraline Other (See Comments)    Did not feel emotion   Pepto-Bismol [Bismuth Subsalicylate] Rash   Septra  [Sulfamethoxazole-Trimethoprim] Rash   Sulfa Antibiotics Rash    Past Medical History:  Diagnosis Date   ADD (attention deficit disorder)    Cholelithiases    symptomatic   Environmental and seasonal allergies    GAD (generalized anxiety disorder)    Heart murmur    per pt told very faint , no symptoms, told nothing to worry about;  pt had echo done 12-24-2011 in epic ef 55-60% and trace MR   Herpes simplex type 1 infection    History of MRSA infection    left great toe in 2010   History of palpitations    ED visit in epic 05/ 2017;  evaluated by cardiology--- dr Mamie Nick. [facility] note in epic 08-31-2015 , ST on EKG , no further work-up;  pt had previou echo in epic 12-24-2011 ef 55-60%   History of suicidal behavior 03/2019   [facility] admission for IVC   Insomnia    unspecified   PTSD (post-traumatic stress disorder)    Wears glasses     Past Medical History, Surgical history, Social history, and Family history were reviewed and updated as appropriate.   Please see review of systems for further details on the patient's review from today.   Objective:   Physical Exam:  There were no vitals taken for this visit.  Physical Exam Constitutional:      General: She is not in acute distress. Musculoskeletal:        General: No deformity.  Neurological:     Mental Status: She is alert and oriented to person, place, and time.     Coordination: Coordination normal.  Psychiatric:        Attention and Perception: Attention and perception normal. She does not perceive auditory or visual hallucinations.        Mood and Affect: Mood normal. Mood is not anxious or depressed. Affect is not labile, blunt, angry or inappropriate.        Speech: Speech normal.        Behavior: Behavior normal.        Thought Content: Thought content normal. Thought content is not paranoid or delusional. Thought content does not include homicidal or suicidal ideation. Thought content does not include homicidal or  suicidal plan.        Cognition and Memory: Cognition and memory normal.        Judgment: Judgment normal.     Comments: Insight intact     Lab Review:     Component Value Date/Time   NA 137 04/01/2019 2236   K 3.3 (L) 04/01/2019 2236   CL 101 04/01/2019 2236   CO2 26 04/01/2019 2236   GLUCOSE 108 (H) 04/01/2019 2236   BUN 7 04/01/2019 2236   CREATININE 0.84 04/01/2019 2236   CALCIUM 8.8 (L) 04/01/2019 2236   PROT 7.5 04/01/2019 2236   ALBUMIN 3.9 04/01/2019 2236   AST 20 04/01/2019 2236   ALT 14 04/01/2019 2236  ALKPHOS 36 (L) 04/01/2019 2236   BILITOT 0.4 04/01/2019 2236   GFRNONAA >60 04/01/2019 2236   GFRAA >60 04/01/2019 2236       Component Value Date/Time   WBC 6.6 12/31/2021 1304   WBC 8.5 04/01/2019 2236   RBC 4.01 12/31/2021 1304   HGB 12.1 12/31/2021 1304   HCT 36.9 12/31/2021 1304   PLT 506 (H) 12/31/2021 1304   MCV 92.0 12/31/2021 1304   MCH 30.2 12/31/2021 1304   MCHC 32.8 12/31/2021 1304   RDW 15.2 12/31/2021 1304   LYMPHSABS 2.1 12/31/2021 1304   MONOABS 0.5 12/31/2021 1304   EOSABS 0.2 12/31/2021 1304   BASOSABS 0.1 12/31/2021 1304    No results found for: "POCLITH", "LITHIUM"   No results found for: "PHENYTOIN", "PHENOBARB", "VALPROATE", "CBMZ"   .res Assessment: Plan:    Plan:  PDMP reviewed  1. Lexapro '20mg'$  daily  2. Clonazepam 1.'5mg'$  at hs 3. Trazdone '100mg'$  - 2 at hs 4. Wellbutrin XL '450mg'$  daily. Denies seizure history. 5. Rexulti 1 mg daily  6. Gabapentin '300mg'$  daily to BID  Patient out of work from 03/12/2021 through 06/18/2021.   Completed IOP program at [facility]. Also seeing an individual therapist twice a week.   RTC 4 weeks  Patient advised to contact office with any questions, adverse effects, or acute worsening in signs and symptoms.  Discussed potential benefits, risk, and side effects of benzodiazepines to include potential risk of tolerance and dependence, as well as possible drowsiness.  Advised patient  not to drive if experiencing drowsiness and to take lowest possible effective dose to minimize risk of dependence and tolerance.  Discussed potential metabolic side effects associated with atypical antipsychotics, as well as potential risk for movement side effects. Advised pt to contact office if movement side effects occur.    There are no diagnoses linked to this encounter.   Please see After Visit Summary for patient specific instructions.  Future Appointments  Date Time Provider [place]  05/22/2022  9:20 AM Bowen, Collene Leyden, DO [facility] None  05/22/2022  1:00 PM Jannifer Hick, [facility] None  05/29/2022  1:00 PM Jannifer Hick [facility] None  06/05/2022  2:40 PM Laqueta Linden, MD [facility] None  06/13/2022  4:00 PM Jannifer Hick, [facility] CP[facility] None  06/23/2022  3:40 PM Valetta Close, Collene Leyden, DO [facility] None  07/03/2022  2:30 PM [facility] PHLEBOTOMIST [facility] None  07/03/2022  3:00 PM Ladell Pier, MD [facility] None    No orders of the defined types were placed in this encounter.   -------------------------------

## 2022-05-21 NOTE — Telephone Encounter (Signed)
Received short term dis claim forms from [employer]. Give to nurse.

## 2022-05-22 ENCOUNTER — Ambulatory Visit (INDEPENDENT_AMBULATORY_CARE_PROVIDER_SITE_OTHER): Payer: BC Managed Care – PPO | Admitting: Family Medicine

## 2022-05-22 ENCOUNTER — Encounter (INDEPENDENT_AMBULATORY_CARE_PROVIDER_SITE_OTHER): Payer: Self-pay | Admitting: Family Medicine

## 2022-05-22 ENCOUNTER — Ambulatory Visit: Payer: BC Managed Care – PPO | Admitting: Behavioral Health

## 2022-05-22 ENCOUNTER — Other Ambulatory Visit: Payer: Self-pay

## 2022-05-22 ENCOUNTER — Other Ambulatory Visit (HOSPITAL_COMMUNITY): Payer: Self-pay

## 2022-05-22 VITALS — BP 98/66 | HR 101 | Temp 98.0°F | Ht 66.0 in | Wt 210.0 lb

## 2022-05-22 DIAGNOSIS — Z1331 Encounter for screening for depression: Secondary | ICD-10-CM | POA: Insufficient documentation

## 2022-05-22 DIAGNOSIS — Z6833 Body mass index (BMI) 33.0-33.9, adult: Secondary | ICD-10-CM

## 2022-05-22 DIAGNOSIS — F3289 Other specified depressive episodes: Secondary | ICD-10-CM

## 2022-05-22 DIAGNOSIS — R5383 Other fatigue: Secondary | ICD-10-CM | POA: Diagnosis not present

## 2022-05-22 DIAGNOSIS — E669 Obesity, unspecified: Secondary | ICD-10-CM

## 2022-05-22 DIAGNOSIS — R0602 Shortness of breath: Secondary | ICD-10-CM | POA: Diagnosis not present

## 2022-05-22 DIAGNOSIS — R632 Polyphagia: Secondary | ICD-10-CM

## 2022-05-22 DIAGNOSIS — F5081 Binge eating disorder: Secondary | ICD-10-CM

## 2022-05-22 DIAGNOSIS — Z6834 Body mass index (BMI) 34.0-34.9, adult: Secondary | ICD-10-CM

## 2022-05-22 DIAGNOSIS — R948 Abnormal results of function studies of other organs and systems: Secondary | ICD-10-CM | POA: Diagnosis not present

## 2022-05-22 DIAGNOSIS — F32A Depression, unspecified: Secondary | ICD-10-CM | POA: Insufficient documentation

## 2022-05-22 MED ORDER — BD PEN NEEDLE MINI U/F 31G X 5 MM MISC
0 refills | Status: AC
  Filled 2022-05-22: qty 100, 90d supply, fill #0

## 2022-05-22 MED ORDER — SAXENDA 18 MG/3ML ~~LOC~~ SOPN
3.0000 mg | PEN_INJECTOR | Freq: Every day | SUBCUTANEOUS | 0 refills | Status: DC
  Filled 2022-05-22 (×2): qty 15, 30d supply, fill #0
  Filled 2022-05-22: qty 45, 90d supply, fill #0
  Filled 2022-05-26: qty 15, 30d supply, fill #0

## 2022-05-23 ENCOUNTER — Other Ambulatory Visit (HOSPITAL_COMMUNITY): Payer: Self-pay

## 2022-05-23 LAB — VITAMIN B12: Vitamin B-12: 484 pg/mL (ref 232–1245)

## 2022-05-23 LAB — LIPID PANEL
Chol/HDL Ratio: 2.9 ratio (ref 0.0–4.4)
Cholesterol, Total: 159 mg/dL (ref 100–199)
HDL: 55 mg/dL (ref >39)
LDL Chol Calc (NIH): 91 mg/dL (ref 0–99)
Triglycerides: 69 mg/dL (ref 0–149)
VLDL Cholesterol Cal: 13 mg/dL (ref 5–40)

## 2022-05-23 LAB — COMPREHENSIVE METABOLIC PANEL
ALT: 11 IU/L (ref 0–32)
AST: 16 IU/L (ref 0–40)
Albumin/Globulin Ratio: 1.6 (ref 1.2–2.2)
Albumin: 4.5 g/dL (ref 3.8–4.9)
Alkaline Phosphatase: 87 IU/L (ref 44–121)
BUN/Creatinine Ratio: 11 (ref 9–23)
BUN: 11 mg/dL (ref 6–24)
Bilirubin Total: 0.4 mg/dL (ref 0.0–1.2)
CO2: 21 mmol/L (ref 20–29)
Calcium: 9.7 mg/dL (ref 8.7–10.2)
Chloride: 100 mmol/L (ref 96–106)
Creatinine, Ser: 1.04 mg/dL — ABNORMAL HIGH (ref 0.57–1.00)
Globulin, Total: 2.8 g/dL (ref 1.5–4.5)
Glucose: 78 mg/dL (ref 70–99)
Potassium: 4.5 mmol/L (ref 3.5–5.2)
Sodium: 140 mmol/L (ref 134–144)
Total Protein: 7.3 g/dL (ref 6.0–8.5)
eGFR: 64 mL/min/{1.73_m2} (ref >59)

## 2022-05-23 LAB — TSH: TSH: 2 u[IU]/mL (ref 0.450–4.500)

## 2022-05-23 LAB — FOLATE: Folate: 9.1 ng/mL (ref >3.0)

## 2022-05-23 LAB — T4, FREE: Free T4: 1.02 ng/dL (ref 0.82–1.77)

## 2022-05-23 LAB — VITAMIN D 25 HYDROXY (VIT D DEFICIENCY, FRACTURES): Vit D, 25-Hydroxy: 29.7 ng/mL — ABNORMAL LOW (ref 30.0–100.0)

## 2022-05-23 LAB — HEMOGLOBIN A1C
Est. average glucose Bld gHb Est-mCnc: 100 mg/dL
Hgb A1c MFr Bld: 5.1 % (ref 4.8–5.6)

## 2022-05-23 LAB — INSULIN, RANDOM: INSULIN: 1.5 u[IU]/mL — ABNORMAL LOW (ref 2.6–24.9)

## 2022-05-26 ENCOUNTER — Other Ambulatory Visit: Payer: Self-pay

## 2022-05-26 ENCOUNTER — Other Ambulatory Visit (HOSPITAL_COMMUNITY): Payer: Self-pay

## 2022-05-26 NOTE — Progress Notes (Signed)
Office: [phone]  /  Fax: [phone]    Date: 06/02/2022   Appointment Start Time: 12:01pm Duration: 50 minutes Provider: Glennie Isle, Psy.D. Type of Session: Intake for Individual Therapy  Location of Patient: Home (private location) Location of Provider: Provider's home (private office) Type of Contact: Telepsychological Visit via MyChart Video Visit  Informed Consent: Prior to proceeding with today's appointment, two pieces of identifying information were obtained. In addition, Monica [name]'s physical location at the time of this appointment was obtained as well a phone number she could be reached at in the event of technical difficulties. Monica [name] and this provider participated in today's telepsychological service.   The provider's role was explained to Monica [name]. The provider reviewed and discussed issues of confidentiality, privacy, and limits therein (e.g., reporting obligations). In addition to verbal informed consent, written informed consent for psychological services was obtained prior to the initial appointment. Since the clinic is not a 24/7 crisis center, mental health emergency resources were shared and this  provider explained MyChart, e-mail, voicemail, and/or other messaging systems should be utilized only for non-emergency reasons. This provider also explained that information obtained during appointments will be placed in [facility] medical record and relevant information will be shared with other providers at [facility] for coordination of care. Monica [name] agreed information may be shared with other [facility] providers as needed for coordination of care and by signing the service agreement document, she provided written consent for coordination of care. Prior to initiating telepsychological services, Monica [name] completed an informed consent document, which included the development of a safety plan (i.e., an emergency contact and emergency  resources) in the event of an emergency/crisis. Monica [name] verbally acknowledged understanding she is ultimately responsible for understanding her insurance benefits for telepsychological and in-person services. This provider also reviewed confidentiality, as it relates to telepsychological services. Monica [name]  acknowledged understanding that appointments cannot be recorded without both party consent and she is aware she is responsible for securing confidentiality on her end of the session. Monica [name] verbally consented to proceed.  Chief Complaint/HPI: Monica [name] was referred by Dr. Loyal Gambler due to  other depression . Per the note for the visit with Dr. Loyal Gambler on 05/22/2022, "Patient is on Lexapro 20 mg daily,Rexulti 1 mg daily, bupropion 450 mg daily.  Patient is positive for being an emotional eater and is seeing a therapist.  Bariatric PHQ-9:19 "   During today's appointment, Monica [name] was verbally administered a questionnaire assessing various behaviors related to emotional eating behaviors. Monica [name] endorsed the following: overeat when you are celebrating, experience food cravings on a regular basis, eat certain foods when you are anxious, stressed, depressed, or your feelings are hurt, use food to help you cope with emotional situations, find food is comforting to you, overeat when you are angry or upset, overeat when you are worried about something, overeat frequently when you are bored or lonely, not worry about what you eat when you are in a good mood, overeat when you are angry at someone just to show them they cannot control you, overeat when you are alone, but eat much less when you are with other people, and eat as a reward. She shared she craves pizza, pasta, and chocolate. Monica [name] believes the onset of emotional eating behaviors was around age 67 secondary to a trauma at age 63. She indicated she started taking Saxenda approximately 1.5 months ago, noting that ceased emotional eating behaviors. In  addition, Monica [name] reported she is unsure if she was  ever diagnosed with Binge Eating Disorder, noting she last engaged in binge eating behaviors approximately 1.5 months ago. Notably, she stated her insurance will no longer cover Saxenda starting April 1st. Monica [name] also disclosed a history of restricting calorie intake for weight loss, noting she last restricted caloric intake approximately two weeks ago. She thinks she was averaging approximately 800 calories a day. She denied a history of treatment for disordered eating behaviors. Currently, Monica [name] stated she is "getting used" to her structured meal plan.   Mental Status Examination:  Appearance: neat Behavior: appropriate to circumstances Mood: neutral Affect: mood congruent Speech: WNL Eye Contact: appropriate Psychomotor Activity: WNL Gait: unable to assess  Thought Process: linear, logical, and goal directed and denies suicidal, homicidal, and self-harm ideation, plan and intent  Thought Content/Perception: no hallucinations, delusions, bizarre thinking or behavior endorsed or observed Orientation: AAOx4 Memory/Concentration: memory, attention, language, and fund of knowledge intact  Insight/Judgment: fair  Family & Psychosocial History: Monica [name] reported she is not in a relationship and she has one daughter (age 79). 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 Additionally, Monica [name] shared her highest level of education obtained is a bachelor's degree. Currently, Monica [name]'s social support system consists of her brother, sister, mother, and daughter. Moreover, [name] stated she resides with her dog (Monica [name]).   Medical History:  Past Medical History:  Diagnosis Date   ADD (attention deficit disorder)    Anxiety    Cholelithiases    symptomatic   Depression    Environmental and seasonal allergies    GAD  (generalized anxiety disorder)    Gallbladder disease    Heart murmur    per pt told very faint , no symptoms, told nothing to worry about;  pt had echo done 12-24-2011 in epic ef 55-60% and trace MR   Herpes simplex type 1 infection    History of MRSA infection    left great toe in 2010   History of palpitations    ED visit in epic 05/ 2017;  evaluated by cardiology--- dr Mamie Nick. [facility] note in epic 08-31-2015 , ST on EKG , no further work-up;  pt had previou echo in epic 12-24-2011 ef 55-60%   History of suicidal behavior 03/2019   [facility] admission for IVC   Insomnia    unspecified   Palpitations    PTSD (post-traumatic stress disorder)    Wears glasses    Past Surgical History:  Procedure Laterality Date   CHOLECYSTECTOMY N/A 09/03/2020   Procedure: LAPAROSCOPIC CHOLECYSTECTOMY;  Surgeon: Coralie Keens, MD;  Location: [facility];  Service: General;  Laterality: N/A;   REDUCTION MAMMAPLASTY Bilateral 1999   Current Outpatient Medications on File Prior to Visit  Medication Sig Dispense Refill   Brexpiprazole (REXULTI) 0.5 MG TABS Take one tablet daily. 30 tablet 5   brexpiprazole (REXULTI) 1 MG TABS tablet Take 1 tablet (1 mg total) by mouth daily. 30 tablet 5   buPROPion (WELLBUTRIN XL) 150 MG 24 hr tablet TAKE THREE TABLETS EVERY MORNING. 270 tablet 1   cetirizine (ZYRTEC) 10 MG tablet Take 10 mg by mouth daily.     clonazePAM (KLONOPIN) 1 MG tablet TAKE ONE AND 1/2 TABLETS AT BEDTIME AS NEEDED FOR SLEEP. 45 tablet 2   escitalopram (LEXAPRO) 20 MG tablet Take 1 tablet (20 mg total) by mouth daily. 90 tablet 1   fluticasone (FLONASE) 50 MCG/ACT nasal spray  Place 1 spray daily into both nostrils.     gabapentin (NEURONTIN) 300 MG capsule Take 1 capsule (300 mg total) by mouth 2 (two) times daily. 60 capsule 2   Insulin Pen Needle (B-D UF III MINI PEN NEEDLES) 31G X 5 MM MISC Use once daily as directed 100 each 0   levonorgestrel (MIRENA) 20 MCG/DAY IUD 1 each by  Intrauterine route once.     liothyronine (CYTOMEL) 5 MCG tablet 2 tablet on an empty stomach Oral once a day     Liraglutide -Weight Management (SAXENDA) 18 MG/3ML SOPN Inject 3 mg into the skin daily. 45 mL 0   traZODone (DESYREL) 100 MG tablet Take 1-2 tablets (100-200 mg total) by mouth at bedtime. 180 tablet 1   valACYclovir (VALTREX) 500 MG tablet Take 500 mg by mouth 2 (two) times daily as needed (flare up).     No current facility-administered medications on file prior to visit.  Monica [name] stated she is medication compliant.   Mental Health History: Cannie reported she currently attends therapeutic services with [name], Monica [name]), noting her provider is leaving and they have one more appointment. She was reportedly informed there are other providers at the practice, but was also receptive to referral options. She further shared a history of starting therapeutic services in her early 16s "on and off." Aradhya also disclosed a history of PHP for four weeks and IOP for three weeks with [facility], noting she completed IOP three weeks ago. She stated all psychotropic medications are currently prescribed by her psychiatric provider, Monica Lair, NP.  Laprecious endorsed a family history of substance abuse (brother), bipolar disorder (mother), and depression (father's side). mental health related concerns. Regarding trauma history, Niyoka stated she found her mother after she died by suicide when Amberlea was 52 years old. After her mother's passing, "no one talked about it." She recalled that was the first time she experienced suicidal ideation. She denied experiencing suicidal plan and intent during that time. In 2021, Azuree discussed a history of IVC, noting she attended therapeutic services every other week after the hospitalization. She recalled at the time a 12 year relationship ended resulting in suicidal ideation, adding, "I tried to do everything to harm  myself, but I did not." Her ex-boyfriend at the time called law enforcement. Annell stated she last experienced suicidal ideation during PHP, noting she spoke with her therapist. She denied experiencing suicidal plan and intent. She denied experiencing current suicidal ideation, plan, and intent. The following protective factors were identified for Janicia: daughter, family, "myself," and pursuing the "new chapter of [my] life." She expressed a desire to move to [place] and find a new job, adding she has already started applying for jobs. If she were to become overwhelmed in the future, which is a sign that a crisis may occur, she identified the following coping skills she could engage in: engage in urge surfing, reach out to family, journal, read devotions, work on jigsaw puzzle, and walk dog. It was recommended the aforementioned be written down and developed into a coping card for future reference. Psychoeducation regarding the importance of reaching out to a trusted individual and/or utilizing emergency resources if there is a change in emotional status and/or there is an inability to ensure safety was provided. Loran's confidence in reaching out to a trusted individual and/or utilizing emergency resources should there be an intensification in emotional status and/or there is an inability to ensure safety was assessed on a scale  of one to ten where one is not confident and ten is extremely confident. She reported her confidence is a 10. Additionally, Jeannelle denied current access to firearms and/or weapons.  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 She reported experiencing social withdrawal due to weight; crying spells; and attention and concentration issues. Latayvia explained she was diagnosed with ADHD by her PCP approximately 15 years ago, noting her ADHD medication wad discontinued when she hospitalized under  an IVC. Additionally, she discussed a history of panic attacks, noting she last experienced one when she was in [facility]. Alveda denied current alcohol use. She denied tobacco use. She denied illicit/recreational substance use. Furthermore, Haleigh indicated she is not experiencing the following: hallucinations and delusions, paranoia, symptoms of mania , symptoms of trauma, and obsessions and compulsions. She also denied current suicidal ideation, plan, and intent; history of and current homicidal ideation, plan, and intent; and history of and current engagement in self-harm.  Legal History: Deasia reported there is no history of legal involvement.   Structured Assessments Results: The Patient Health Questionnaire-9 (PHQ-9) is a self-report measure that assesses symptoms and severity of depression over the course of the last two weeks. Zuleika obtained a score of 4 suggesting minimal depression. Ellyssa finds the endorsed symptoms to be somewhat difficult. [0= Not at all; 1= Several days; 2= More than half the days; 3= Nearly every day] Little interest or pleasure in doing things 1  Feeling down, depressed, or hopeless 0  Trouble falling or staying asleep, or sleeping too much 0  Feeling tired or having little energy 1  Poor appetite or overeating 1  Feeling bad about yourself --- or that you are a failure or have let yourself or your family down 0  Trouble concentrating on things, such as reading the newspaper or watching television 1  Moving or speaking so slowly that other people could have noticed? Or the opposite --- being so fidgety or restless that you have been moving around a lot more than usual 0  Thoughts that you would be better off dead or hurting yourself in some way 0  PHQ-9 Score 4    The Generalized Anxiety Disorder-7 (GAD-7) is a brief self-report measure that assesses symptoms of anxiety over the course of the last two weeks. Kelsa obtained a score of 3 suggesting minimal anxiety.  Andera finds the endorsed symptoms to be very difficult. [0= Not at all; 1= Several days; 2= Over half the days; 3= Nearly every day] Feeling nervous, anxious, on edge 3  Not being able to stop or control worrying 0  Worrying too much about different things 0  Trouble relaxing 0  Being so restless that it's hard to sit still 0  Becoming easily annoyed or irritable 0  Feeling afraid as if something awful might happen 0  GAD-7 Score 3   Interventions:  Conducted a chart review Focused on rapport building Verbally administered PHQ-9 and GAD-7 for symptom monitoring Verbally administered Food & Mood questionnaire to assess various behaviors related to emotional eating Provided emphatic reflections and validation Collaborated with patient on a treatment goal  Psychoeducation provided regarding physical versus emotional hunger Conducted a risk assessment  Diagnostic Impressions & Provisional DSM-5 Diagnosis(es): Malijah discussed a history of engagement in emotional and binge eating behaviors as well as restricting food intake. She explained Kirke Shaggy has helped with emotional and binge eating behaviors; however, discussed it  will no longer be covered by insurance starting April 1st. She also reported she is no longer restricting food intake and is eating based on her prescribed structured meal plan. She denied engagement in any other disordered eating behaviors. As such, the following diagnosis was assigned: F50.89 Other Specified Feeding or Eating Disorder, Emotional and Binge Eating Behaviors. Additionally, she discussed a history of an ADHD diagnosis and medication, noting the medication was discontinued in 2021. She noted she continues to experience ADHD-related symptomatology. Given the limited scope of this appointment and this provider's role with the clinic, it was recommended she discuss further with her psychiatric provider. Furthermore, Rushie endorsed depression-related symptomatology  during the clinic interview (e.g., social withdrawal, crying spells) and on administered measures (e.g., little interest or pleasure in doing things), as such the following diagnosis was assigned: F32.A Unspecified Depressive Disorder.  Plan: Chavonna appears able and willing to participate as evidenced by collaboration on a treatment goal, engagement in reciprocal conversation, and asking questions as needed for clarification. The next appointment is scheduled for 06/10/2022 at 2:30pm, which will be via MyChart Video Visit. The following treatment goal was established: increase coping skills. This provider will regularly review the treatment plan and medical chart to keep informed of status changes. Meekah expressed understanding and agreement with the initial treatment plan of care.   Desire will be sent a handout via e-mail to utilize between now and the next appointment to increase awareness of hunger patterns and subsequent eating. Bethlehem provided verbal consent during today's appointment for this provider to send the handout via e-mail. She also requested this provider e-mail an urge surfing exercise related to cravings. Moreover, she provided verbal consent for this provider to e-mail referral options for therapeutic services. Maud will continue with her primary therapist and psychiatric provider.

## 2022-05-27 ENCOUNTER — Other Ambulatory Visit: Payer: Self-pay

## 2022-05-27 NOTE — Progress Notes (Signed)
Chief Complaint:   High Rolls (MR# [id number]) is a 52 y.o. female who presents for evaluation and treatment of obesity and related comorbidities. Current BMI is Body mass index is 33.89 kg/m. Sedina has been struggling with her weight for many years and has been unsuccessful in either losing weight, maintaining weight loss, or reaching her healthy weight goal.  Shareece gained weight after a traumatic event.  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  Positive for menstrual food cravings, has an IUD.  She is on Saxenda 3 mg subcu daily injection, history of binge eating.  Makyna is currently in the action stage of change and ready to dedicate time achieving and maintaining a healthier weight. Madalyne is interested in becoming our patient and working on intensive lifestyle modifications including (but not limited to) diet and exercise for weight loss.  Mattisen's habits were reviewed today and are as follows: Her family eats meals together, her desired weight loss is 50 lbs, she has been heavy most of her life, she started gaining weight at the age of 52, her heaviest weight ever was 223 pounds, she has significant food cravings issues, she snacks frequently in the evenings, she skips meals frequently, she is frequently drinking liquids with calories, she frequently makes poor food choices, she has problems with excessive hunger, she frequently eats larger portions than normal, she has binge eating behaviors, and she struggles with emotional eating.  Depression Screen Harryette's Food and Mood (modified PHQ-9) score was 19.  Subjective:   1. Other fatigue Lisia admits to daytime somnolence and admits to waking up still tired. Patient has a history of symptoms of daytime fatigue and morning fatigue. Cedricka generally gets 7 or 8 hours of sleep per night, and states that she has nightime awakenings and generally restful sleep. Snoring is  present. Apneic episodes are not present. Epworth Sleepiness Score is 2.  EKG is, sinus tachy heart rate 104 bpm.  No ischemia.  2. SOBOE (shortness of breath on exertion) Miriya notes increasing shortness of breath with exercising and seems to be worsening over time with weight gain. She notes getting out of breath sooner with activity than she used to. This has not gotten worse recently. Cynara denies shortness of breath at rest or orthopnea.  3. Polyphagia Patient started Saxenda at 223 LBS.  Restarted 1 month ago.  Patient denies nausea but has some dyspepsia.  She is using protein shakes, bars.  Enjoys satiety.  4. Binge eating disorder Long battle with BED.  Under better control with Saxenda and treatment for mood disorder.  5. Low basal metabolic rate Worsening. Reviewed IC results with metabolic rate 123456 cal/day slower than expected likely due to inadequate nutrient intake.  6. Other depression Patient is on Lexapro 20 mg daily,Rexulti 1 mg daily, bupropion 450 mg daily.  Patient is positive for being an emotional eater and is seeing a therapist.  Bariatric PHQ-9:19  Assessment/Plan:   1. Other fatigue Alexander does feel that her weight is causing her energy to be lower than it should be. Fatigue may be related to obesity, depression or many other causes. Labs will be ordered, and in the meanwhile, Lianni will focus on self care including making healthy food choices, increasing physical activity and focusing on stress reduction.  - EKG 12-Lead - VITAMIN D 25 Hydroxy (Vit-D Deficiency, Fractures) - TSH - T4, free - Lipid panel - Insulin, random - Hemoglobin A1c -  Folate - Comprehensive metabolic panel - Vitamin 123456  2. SOBOE (shortness of breath on exertion) Tomeka does feel that she gets out of breath more easily that she used to when she exercises. Kristal's shortness of breath appears to be obesity related and exercise induced. She has agreed to work on weight loss and  gradually increase exercise to treat her exercise induced shortness of breath. Will continue to monitor closely.  3. Polyphagia Refill - Liraglutide -Weight Management (SAXENDA) 18 MG/3ML SOPN; Inject 3 mg into the skin daily.  Dispense: 45 mL; Refill: 0  Refill - Insulin Pen Needle (B-D UF III MINI PEN NEEDLES) 31G X 5 MM MISC; Use once daily as directed  Dispense: 100 each; Refill: 0  4. Binge eating disorder Set up CBT with Dr. Mallie Mussel.  5. Low basal metabolic rate Review prescribed meal plan stressed importance of eating lean protein and fiber with meals.  Aim for 7 to 8 hours of sleep at night and be more consistent with exercise.  6. Other depression Continue all meds for mood.  Focus on nutrition, sleep and stress reduction.  7. Depression screen Tanishia had a positive depression screening. Depression is commonly associated with obesity and often results in emotional eating behaviors. We will monitor this closely and work on CBT to help improve the non-hunger eating patterns. Referral to Psychology may be required if no improvement is seen as she continues in our clinic.  8. Generalized obesity  9. Obesity,current BMI 34.0 100 calorie snack list given.   Refill - Liraglutide -Weight Management (SAXENDA) 18 MG/3ML SOPN; Inject 3 mg into the skin daily.  Dispense: 45 mL; Refill: 0  Refill - Insulin Pen Needle (B-D UF III MINI PEN NEEDLES) 31G X 5 MM MISC; Use once daily as directed  Dispense: 100 each; Refill: 0  Daja is currently in the action stage of change and her goal is to continue with weight loss efforts. I recommend Triana begin the structured treatment plan as follows:  She has agreed to the Category 2 Plan.  Exercise goals: All adults should avoid inactivity. Some physical activity is better than none, and adults who participate in any amount of physical activity gain some health benefits.  At least 30 minutes 3 times per week.  Behavioral modification strategies:  increasing lean protein intake, increasing vegetables, increasing water intake, decreasing eating out, no skipping meals, meal planning and cooking strategies, keeping healthy foods in the home, and decreasing junk food.  She was informed of the importance of frequent follow-up visits to maximize her success with intensive lifestyle modifications for her multiple health conditions. She was informed we would discuss her lab results at her next visit unless there is a critical issue that needs to be addressed sooner. Amellia agreed to keep her next visit at the agreed upon time to discuss these results.  Objective:   Blood pressure 98/66, pulse (!) 101, temperature 98 F (36.7 C), height 5\' 6"  (1.676 m), weight 210 lb (95.3 kg), SpO2 97 %. Body mass index is 33.89 kg/m.  EKG: Normal sinus rhythm, rate 104 bpm.  Indirect Calorimeter completed today shows a VO2 of 186 and a REE of 1282.  Her calculated basal metabolic rate is [id number] thus her basal metabolic rate is worse than expected.  General: Cooperative, alert, well developed, in no acute distress. HEENT: Conjunctivae and lids unremarkable. Cardiovascular: Regular rhythm.  Lungs: Normal work of breathing. Neurologic: No focal deficits.   Lab Results  Component Value Date  CREATININE 1.04 (H) 05/22/2022   BUN 11 05/22/2022   NA 140 05/22/2022   K 4.5 05/22/2022   CL 100 05/22/2022   CO2 21 05/22/2022   Lab Results  Component Value Date   ALT 11 05/22/2022   AST 16 05/22/2022   ALKPHOS 87 05/22/2022   BILITOT 0.4 05/22/2022   Lab Results  Component Value Date   HGBA1C 5.1 05/22/2022   Lab Results  Component Value Date   INSULIN 1.5 (L) 05/22/2022   Lab Results  Component Value Date   TSH 2.000 05/22/2022   Lab Results  Component Value Date   CHOL 159 05/22/2022   HDL 55 05/22/2022   LDLCALC 91 05/22/2022   TRIG 69 05/22/2022   CHOLHDL 2.9 05/22/2022   Lab Results  Component Value Date   WBC 6.6 12/31/2021   HGB  12.1 12/31/2021   HCT 36.9 12/31/2021   MCV 92.0 12/31/2021   PLT 506 (H) 12/31/2021   Lab Results  Component Value Date   FERRITIN 74 12/31/2021   Attestation Statements:   Reviewed by clinician on day of visit: allergies, medications, problem list, medical history, surgical history, family history, social history, and previous encounter notes.  I have personally spent 40 minutes total time today in preparation, patient care, nutritional counseling and documentation for this visit, including the following: review of clinical lab tests; review of medical tests/procedures/services.   I, Davy Pique, am acting as Location manager for Loyal Gambler, DO.  I have reviewed the above documentation for accuracy and completeness, and I agree with the above. Dell Ponto, DO

## 2022-05-29 ENCOUNTER — Ambulatory Visit: Payer: BC Managed Care – PPO | Admitting: Behavioral Health

## 2022-05-29 DIAGNOSIS — F411 Generalized anxiety disorder: Secondary | ICD-10-CM | POA: Diagnosis not present

## 2022-05-29 DIAGNOSIS — F331 Major depressive disorder, recurrent, moderate: Secondary | ICD-10-CM | POA: Diagnosis not present

## 2022-05-29 NOTE — Progress Notes (Signed)
[facility] Counselor/Therapist Progress Note  Patient ID: Monica [name], MRN: [id number],    Date: 05/29/2022  Time Spent: 45 minutes   Treatment Type: Individual Therapy  Reported Symptoms: Anxiety   Mental Status Exam:  Appearance:   Casual     Behavior:  Appropriate and Sharing  Motor:  Normal  Speech/Language:   Clear and Coherent  Affect:  Appropriate and Congruent  Mood:  normal  Thought process:  normal  Thought content:    WNL  Sensory/Perceptual disturbances:    WNL  Orientation:  oriented to person  Attention:  Good  Concentration:  Good  Memory:  WNL  Fund of knowledge:   Good  Insight:    Good  Judgment:   Good  Impulse Control:  Good   Risk Assessment: Danger to Self:  No Self-injurious Behavior: No Danger to Others: No Duty to Warn:no Physical Aggression / Violence:No  Access to Firearms a concern: No  Gang Involvement:No   Subjective:   Since last being seen by this counselor the patient reports the inpatient treatment program she was admitted to was very helpful. She reports she was recently discharged from intensive outpatient therapy. She states she is implementing the strategies she learned in treatment. She reports she no longer has urges/cravings to bing eat or text her boyfriend. She reports developing a coping skills box as we discussed in the last session to assist her and remind her to use her coping mechanisms. She reports she is utilizing coping skills of coloring daily, naming her feelings, daily devotions, deep breathing, incorporating setting boundaries, aromatherapy and journaling. She states learning DBT skills inpatient treatment she states utilizing the skills of urge surfing and reframing her thoughts if she has negative thoughts. She expressed gratitude with not experiencing any negative thoughts recently. She reports plans to take a leave of absence from her employer she states plans to soon move and relocate to another area. She  states she is excited about relocating. She states she is receiving support from her family. The patient reports establishing boundaries with her stepmother she states it has been helpful by learning she can do things to help herself versus others. She reports "This is the first time in my whole life that I know what I need to do for myself". The patient expressed gratitude with having several things to look forward to such as her daughter graduating, obtaining new employment and relocating to another area.   She reports she is medication compliant she reports to take Lexapro, Wellbutrin, Rexulti, Trazodone, and Klonopin. She states to take Gabapentin as needed for anxiety. She reports the prescribed medication regimen is helpful. The patient rated anxiety at a 3 on a scale of 0 to 10 with 10 being severe. She reports experiencing anxiety due to developing a family medical leave letter for her employer she states plans to resign once she has obtained employment in another area. She rated depression at a 1 today on a scale of 0 to 10 with 10 being severe. She reports to have crying spells sometimes and identified triggers as holidays. She reports she is experiencing grief and loss due to a previous relationship she reports belief that she is currently in the stage of acceptance. She reports "I have accepted the fact that he doesn't want to be with me and if he wanted to be with me he would have reached out already". She expressed gratitude with re-enrolling into a nutrition program and states she  has loss weight since she has started the program she reports feeling better about herself. The patient states plans to continue receiving medication management with [facility] once she relocates to another area. She states recently joining an online mental health support group as additional support to help her manage her mental health symptoms. She states she is uncertain of continuing with therapy at  [place] once she relocates in another area. The patient states interest with discussing steps moving forward with her life in the next session. The patient was informed this counselors time is limited with [facility]. She requested to continue receiving therapy and was receptive of what therapist she will transition too.  Counselor conducted check in. Counselor discussed the patients mental health symptoms and requested for the patient to rate the symptoms. Counselor questioned coping strategies utilized.Counselor reviewed the patients treatment plan goals. Counselor utilized reflective listening and validated the patient. Counselor assessed for SI/HI, AH/VH. Counselor informed the patient her time is limited with [facility] and questioned the patients interest with continuing with therapy.  Counselor informed the patient what therapist she will transition too.   Interventions: Motivational Interviewing  Diagnosis:   ICD-10-CM   1. Generalized anxiety disorder  F41.1     2. Major depressive disorder, recurrent episode, moderate ([place])  F33.1       Plan:   Client is to begin seeing medication provider for support of mood management for depression, passive SI, and insomnia.    Client to engage in CBT: challenging negative internal ruminations and self-talk AEB journaling daily or expressing thoughts to support persons in their life and then challenging it with truth.  Client to engage in tapping to tap in healthy cognition that challenges negative rumination or deep negative core belief.  Client to practice DBT distress tolerance skills to decrease crying spells and thoughts of not being able to endure their suffering AEB using mindfulness, deep breathing, and TIP to increase tolerance for discomfort, discharge emotional distress, and increase their understanding that they can do hard things.  Client to utilize BSP (brainspotting) with  therapist to help client identify and process triggers for her her depression, anxiety, and abandonment with goal of reducing said SUDs caused by depression/anxiety by 33% in the next 6 months.  Client to work on self-acceptance to increase her self-esteem despite her weight changes.  Progress: Client processed progress in her weight loss journey and also in her thoughts about her body and her inner beauty that are contributing to more kind self talk.  Client made progress choosing to see her family regardless of her weight, not giving in to the fears of judgment.      Jannifer Hick, [facility]

## 2022-06-02 ENCOUNTER — Telehealth (INDEPENDENT_AMBULATORY_CARE_PROVIDER_SITE_OTHER): Payer: BC Managed Care – PPO | Admitting: Psychology

## 2022-06-02 DIAGNOSIS — F5089 Other specified eating disorder: Secondary | ICD-10-CM | POA: Diagnosis not present

## 2022-06-02 DIAGNOSIS — F32A Depression, unspecified: Secondary | ICD-10-CM | POA: Diagnosis not present

## 2022-06-02 NOTE — Telephone Encounter (Signed)
Paper work has been completed, signed and faxed per request.

## 2022-06-05 ENCOUNTER — Ambulatory Visit (INDEPENDENT_AMBULATORY_CARE_PROVIDER_SITE_OTHER): Payer: BC Managed Care – PPO | Admitting: Family Medicine

## 2022-06-05 ENCOUNTER — Encounter (INDEPENDENT_AMBULATORY_CARE_PROVIDER_SITE_OTHER): Payer: Self-pay | Admitting: Family Medicine

## 2022-06-05 ENCOUNTER — Other Ambulatory Visit (HOSPITAL_COMMUNITY): Payer: Self-pay

## 2022-06-05 VITALS — BP 98/66 | HR 102 | Temp 98.4°F | Ht 66.0 in | Wt 210.0 lb

## 2022-06-05 DIAGNOSIS — F329 Major depressive disorder, single episode, unspecified: Secondary | ICD-10-CM | POA: Diagnosis not present

## 2022-06-05 DIAGNOSIS — D75839 Thrombocytosis, unspecified: Secondary | ICD-10-CM

## 2022-06-05 DIAGNOSIS — Z6834 Body mass index (BMI) 34.0-34.9, adult: Secondary | ICD-10-CM

## 2022-06-05 DIAGNOSIS — E669 Obesity, unspecified: Secondary | ICD-10-CM | POA: Diagnosis not present

## 2022-06-05 DIAGNOSIS — E559 Vitamin D deficiency, unspecified: Secondary | ICD-10-CM

## 2022-06-05 MED ORDER — VITAMIN D (ERGOCALCIFEROL) 1.25 MG (50000 UNIT) PO CAPS: 50000.0000 [IU] | ORAL_CAPSULE | ORAL | 0 refills | Status: DC

## 2022-06-05 NOTE — Progress Notes (Signed)
Chief Complaint:   OBESITY Monica [name] is here to discuss her progress with her obesity treatment plan along with follow-up of her obesity related diagnoses. Monica [name] is on the Category 2 Plan and states she is following her eating plan approximately 90% of the time. Monica [name] states she is not exercising.  Today's visit was #: 2 Starting weight: 210 lbs Starting date: 05/22/2022 Today's weight: 210 lbs Today's date: 06/05/2022 Total lbs lost to date: 0 Total lbs lost since last in-office visit: 0  Interim History: Patient stuck with meal plan for the first 2 weeks.  Likes the set up of the plan and quantity of food.  She felt that the quantity was in fact too much at times. Particularly dinner felt like quite a bit.  She was getting in around [place] at supper.  For snack calories she did yogurt or an apple.  Has been taking 3mg  of Saxenda for around 4-5 weeks.  Next few weeks she doesn't have any plans for events or activities.  Next psychiatrist appointment is April 8th.  Does not think any changes need to be made between now and next appointment in terms of meal plan.  Subjective:   1. Vitamin D deficiency Patient is not on Vitamin D and is positive for fatigue.   2. Thrombocytosis Platelet of 506 at last draw in October of 2023.  History of Covid x 2 within just a few months.   3. Major depressive disorder with current active episode, unspecified depression episode severity, unspecified whether recurrent Patient is on Rexulti, Wellbutrin, Klonopin, Lexapro, Trazodone.  Sees psych and counselor/psychologist.  Patient has had EMDR in the past.   Assessment/Plan:   1. Vitamin D deficiency Refill - Vitamin D, Ergocalciferol, (DRISDOL) 1.25 MG (50000 UNIT) CAPS capsule; Take 1 capsule (50,000 Units total) by mouth every 7 (seven) days.  Dispense: 4 capsule; Refill: 0  2. Thrombocytosis Follow up with Dr Lennox Pippins at already scheduled appointments.    3. Major depressive disorder with  current active episode, unspecified depression episode severity, unspecified whether recurrent Patient to discuss further treatment with psych at her appointment in early April/May to discuss non medication options.   4. Obesity with current BMI of 34.0 Monica [name] is currently in the action stage of change. As such, her goal is to continue with weight loss efforts. She has agreed to the Category 2 Plan.   Exercise goals: No exercise has been prescribed at this time.  Behavioral modification strategies: increasing lean protein intake, meal planning and cooking strategies, keeping healthy foods in the home, and planning for success.  Monica [name] has agreed to follow-up with our clinic in 2 weeks. She was informed of the importance of frequent follow-up visits to maximize her success with intensive lifestyle modifications for her multiple health conditions.   Objective:   Blood pressure 98/66, pulse (!) 102, temperature 98.4 F (36.9 C), height 5\' 6"  (1.676 m), weight 210 lb (95.3 kg), SpO2 94 %. Body mass index is 33.89 kg/m.  General: Cooperative, alert, well developed, in no acute distress. HEENT: Conjunctivae and lids unremarkable. Cardiovascular: Regular rhythm.  Lungs: Normal work of breathing. Neurologic: No focal deficits.   Lab Results  Component Value Date   CREATININE 1.04 (H) 05/22/2022   BUN 11 05/22/2022   NA 140 05/22/2022   K 4.5 05/22/2022   CL 100 05/22/2022   CO2 21 05/22/2022   Lab Results  Component Value Date   ALT 11 05/22/2022   AST 16 05/22/2022  ALKPHOS 87 05/22/2022   BILITOT 0.4 05/22/2022   Lab Results  Component Value Date   HGBA1C 5.1 05/22/2022   Lab Results  Component Value Date   INSULIN 1.5 (L) 05/22/2022   Lab Results  Component Value Date   TSH 2.000 05/22/2022   Lab Results  Component Value Date   CHOL 159 05/22/2022   HDL 55 05/22/2022   LDLCALC 91 05/22/2022   TRIG 69 05/22/2022   CHOLHDL 2.9 05/22/2022   Lab Results   Component Value Date   VD25OH 29.7 (L) 05/22/2022   Lab Results  Component Value Date   WBC 6.6 12/31/2021   HGB 12.1 12/31/2021   HCT 36.9 12/31/2021   MCV 92.0 12/31/2021   PLT 506 (H) 12/31/2021   Lab Results  Component Value Date   FERRITIN 74 12/31/2021   Attestation Statements:   Reviewed by clinician on day of visit: allergies, medications, problem list, medical history, surgical history, family history, social history, and previous encounter notes.  Time spent on visit including pre-visit chart review and post-visit care and charting was 45 minutes.   I, Malcolm Metro, RMA, am acting as transcriptionist for Reuben Likes, MD. I have reviewed the above documentation for accuracy and completeness, and I agree with the above. - Reuben Likes, MD

## 2022-06-10 ENCOUNTER — Telehealth (INDEPENDENT_AMBULATORY_CARE_PROVIDER_SITE_OTHER): Payer: BC Managed Care – PPO | Admitting: Psychology

## 2022-06-10 DIAGNOSIS — F5089 Other specified eating disorder: Secondary | ICD-10-CM | POA: Diagnosis not present

## 2022-06-10 DIAGNOSIS — F32A Depression, unspecified: Secondary | ICD-10-CM

## 2022-06-10 NOTE — Progress Notes (Signed)
Office: [phone]  /  Fax: [phone]    Date: June 10, 2022    Appointment Start Time: 2:38pm Duration: 24 minutes Provider: Glennie Isle, Psy.D. Type of Session: Individual Therapy  Location of Patient: Home (private location) Location of Provider: Provider's Home (private office) Type of Contact: Telepsychological Visit via MyChart Video Visit  Session Content: This provider called Monica [name] at 2:32pm as she did not present for today's appointment. A HIPAA compliant voicemail [name] left requesting a call back. Front Environmental manager called this provider at 2:37pm to inform this provider Monica [name] called and indicated she thought the appointment [name] at 2:40pm today. This provider agreed to meet with her as planned. As such, today's appointment [name] initiated 8 minutes late. Monica [name] is a 52 y.o. female presenting for a follow-up appointment to address the previously established treatment goal of increasing coping skills.Today's appointment [name] a telepsychological visit. Monica [name] provided verbal consent for today's telepsychological appointment and she is aware she is responsible for securing confidentiality on her end of the session. Prior to proceeding with today's appointment, Monica [name]'s physical location at the time of this appointment [name] obtained as well a phone number she could be reached at in the event of technical difficulties. Monica [name] and this provider participated in today's telepsychological service.   This provider conducted a brief check-in. Monica [name] shared about her recent appointment with the clinic. She reported her brain is "wired to the number on the scale," noting a belief that "restricting [food intake] is the only way." Further explored and processed. She discussed restricting food intake and then engaging in overeating behaviors later in the day. Psychoeducation provided regarding the impact of insufficient caloric intake on weight loss, health, and binge eating behaviors. Notably,  during today's appointment, Monica [name] receptive to consuming a yogurt & bread/cheese (as noted on her prescribed meal plan). She [name] observed eating, and agreed to eat congruent to her prescribed structured meal plan moving forward vs. restricting food intake. Psychoeducation provided regarding self-compassion. Sincere [name] engaged in a self-compassion exercise to help with eating-related challenges and other ongoing stressors. She [name] encouraged to regularly ask herself, "What do I need right now?" and "How can I comfort and care for myself in this moment?" Furthermore, a risk assessment [name] completed. Monica [name] denied experiencing suicidal, self-harm and homicidal ideation, plan, and intent since the last appointment with this provider. She reported she continues to have easy access to the developed safety plan, and continues to acknowledge understanding regarding the importance of reaching out to trusted individuals and/or emergency resources if she is unable to ensure safety. Overall, Monica [name] [name] receptive to today's appointment as evidenced by openness to sharing, responsiveness to feedback, and willingness to work toward increasing self-compassion.  Mental Status Examination:  Appearance: neat Behavior: appropriate to circumstances Mood: sad Affect: mood congruent Speech: WNL Eye Contact: appropriate Psychomotor Activity: WNL Gait: unable to assess Thought Process: linear, logical, and goal directed and denies suicidal, homicidal, and self-harm ideation, plan and intent  Thought Content/Perception: no hallucinations, delusions, bizarre thinking or behavior endorsed or observed Orientation: AAOx4 Memory/Concentration: memory, attention, language, and fund of knowledge intact  Insight: fair Judgment: fair  Interventions:  Conducted a brief chart review Conducted a risk assessment Reviewed content from the previous session Employed supportive psychotherapy interventions to facilitate reduced  distress and to improve coping skills with identified stressors Employed motivational interviewing skills to assess patient's willingness/desire to adhere to recommended medical treatments and assignments Psychoeducation provided regarding self-compassion Engaged pt in a self-compassion exercise  DSM-5 Diagnosis(es):  F50.89 Other Specified Feeding or Eating Disorder, Emotional and Binge Eating Behaviors and  F32.A Unspecified Depressive Disorder  Treatment Goal & Progress: During the initial appointment with this provider, the following treatment goal [name] established: increase coping skills. Progress is limited, as Monica [name] has just begun treatment with this provider; however, she is receptive to the interaction and interventions and rapport is being established.   Plan: The next appointment is scheduled for 07/07/2022 at 2pm, which will be via [place] Visit. The next session will focus on working towards the established treatment goal. Monica [name] will meet with her primary therapist on Friday.

## 2022-06-13 ENCOUNTER — Ambulatory Visit (INDEPENDENT_AMBULATORY_CARE_PROVIDER_SITE_OTHER): Payer: BC Managed Care – PPO | Admitting: Behavioral Health

## 2022-06-13 ENCOUNTER — Encounter: Payer: Self-pay | Admitting: Behavioral Health

## 2022-06-13 DIAGNOSIS — F331 Major depressive disorder, recurrent, moderate: Secondary | ICD-10-CM | POA: Diagnosis not present

## 2022-06-13 DIAGNOSIS — F411 Generalized anxiety disorder: Secondary | ICD-10-CM | POA: Diagnosis not present

## 2022-06-13 NOTE — Progress Notes (Signed)
[facility] Counselor/Therapist Progress Note  Patient ID: Monica [name], MRN: [id number],    Date: 06/13/2022  Time Spent: 60 minutes   Treatment Type: Psychotherapy  Reported Symptoms: Mild anxiety and depression   Mental Status Exam:  Appearance:   Casual     Behavior:  Appropriate and Sharing  Motor:  Normal  Speech/Language:   Clear and Coherent  Affect:  Appropriate and Congruent  Mood:  normal  Thought process:  normal  Thought content:    WNL  Sensory/Perceptual disturbances:    WNL  Orientation:  oriented to person  Attention:  Good  Concentration:  Good  Memory:  WNL  Fund of knowledge:   Good  Insight:    Good  Judgment:   Good  Impulse Control:  Good   Risk Assessment: Danger to Self:  No Self-injurious Behavior: No Danger to Others: No Duty to Warn:no Physical Aggression / Violence:No  Access to Firearms a concern: No  Gang Involvement:No   Subjective:   The patient reports since last being seen by this therapist she has been doing well. She expressed gratitude with completing her task to work towards her goals of obtaining employment in the area she is looking to relocate. 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 She states "I have to do what's right for me. In the past I would feel guilty about saying that". She states applying for another job. She reports she is continuing to work towards her nutrition program and weight loss goal. She rated anxiety at a 1 today and depression at a 1 on a scale of 0 to 10.  She states not going back to work has helped with maintaining her mood stability. She reports working with her employer made her feel incompetent. She identified triggers that affected her mood as how she was treated by her employer and her previous relationship with her ex-boyfriend whom she states she was in a relationship with for 12 years. She reports practicing acceptance regarding grief and loss of the  relationship with her ex boyfriend and his son. The patient identified what is a priority in her life currently as herself.   She states looking forward to relocating. The patient reports in the past experiencing anger and regret and states she was unable to identify emotions she experienced. She states now she has improved with her emotions she states "I feel free like nothing is holding me back. I was always living for someone else, now I am living for me". She identified a personal goal for herself as selling her home and getting another job. [place]. She identified her long term goal as retiring in five years working [place] [employer]. She identified her support system as her daughter, brother, sister and her stepmother. She reports she is medication compliant. She expressed concern about being unfocused and not being able to complete task. She states to also experience lack of concentration due to ADHD. The patient states interest with being considered for med management to treat her symptoms. She states she has been prescribed Vyvanse, Adderall, and Ritalin in the past. She reports Adderall was helpful in the past, however she states the effect did not last all day. She reports utilizing coping skills of urge surfing, reading daily devotions, practicing self care, positive affirmations, and journaling daily. The patient identified her plan to maintain her optimum mental health  by maintaining therapy for mental health and bariatric counseling. She reports to struggle with negative automatic thoughts that prevent her from wanting to be seen by others that she know due to experiencing feelings of shame and judgement about her weight. She reports experiencing panic attacks in the past, however she states it has not been diagnosed. She reports deep breathing technique helps her manage her panic symptoms. She reports plans to write down coping  strategies on a coping skills card for her to use at any time to help her remember to implement skills she has learned. She states learning various DBT skills learned in the partial hospitalization program has been helpful. She reports meeting with other group members in the program was helpful socialization.She states recently participating in a virtual [facility] mental [facility] group for additional support. She identified her take away from today's session as "Talking about all the things feeling like I have made progress and I have come a long way and you giving me some strategies".   Counselor conducted check in. Counselor utilized open ended questions, reflective listening and validated the patients progress with her mental health. Counselor discussed the patients progress with working towards changing ineffective behaviors. Counselor discussed mental health symptoms experienced and requested for the patient to rate the symptoms. Counselor discussed triggers and coping skills utilized. Counselor discussed reframing negative automatic thoughts. Counselor educated the patient on emotional reasoning and mind reading cognitive distortions. Counselor discussed thoughts/feeling/behaviors are connected. Counselor discussed improving self esteem via monitoring internal dialogue and reframing negative automatic thoughts. Counselor questioned medication compliance. Counselor discussed the patients support system. Counselor explored the patients goals and questioned plans for her to maintain optimum mental health to avoid decompensation. Due to this counselor is leaving [facility] the patient was informed what provider she will transfer to in order to continue continuity of care. The patient was receptive. Counselor assessed for SI/HI, AH/VH.    Interventions: Cognitive Behavioral Therapy and Motivational Interviewing  Diagnosis:   ICD-10-CM   1. Major depressive disorder, recurrent episode, moderate   F33.1     2. Generalized anxiety disorder  F41.1       Plan:   Client is to begin seeing medication provider for support of mood management for depression, passive SI, and insomnia.    Client to engage in CBT: challenging negative internal ruminations and self-talk AEB journaling daily or expressing thoughts to support persons in their life and then challenging it with truth.  Client to engage in tapping to tap in healthy cognition that challenges negative rumination or deep negative core belief.  Client to practice DBT distress tolerance skills to decrease crying spells and thoughts of not being able to endure their suffering AEB using mindfulness, deep breathing, and TIP to increase tolerance for discomfort, discharge emotional distress, and increase their understanding that they can do hard things.  Client to utilize BSP (brainspotting) with therapist to help client identify and process triggers for her her depression, anxiety, and abandonment with goal of reducing said SUDs caused by depression/anxiety by 33% in the next 6 months.  Client to work on self-acceptance to increase her self-esteem despite her weight changes.  Progress: Client processed progress in her weight loss journey and also in her thoughts about her body and her inner beauty that are contributing to more kind self talk.  Client made progress choosing to see her family regardless of her weight, not giving in to the fears of judgment.  Monica Llanoonsenia T Kassaundra [name], [facility]

## 2022-06-16 ENCOUNTER — Ambulatory Visit: Payer: BC Managed Care – PPO | Admitting: Adult Health

## 2022-06-16 ENCOUNTER — Encounter: Payer: Self-pay | Admitting: Adult Health

## 2022-06-16 ENCOUNTER — Telehealth: Payer: Self-pay | Admitting: Psychiatry

## 2022-06-16 DIAGNOSIS — F4310 Post-traumatic stress disorder, unspecified: Secondary | ICD-10-CM

## 2022-06-16 DIAGNOSIS — F411 Generalized anxiety disorder: Secondary | ICD-10-CM

## 2022-06-16 DIAGNOSIS — F331 Major depressive disorder, recurrent, moderate: Secondary | ICD-10-CM

## 2022-06-16 DIAGNOSIS — G4700 Insomnia, unspecified: Secondary | ICD-10-CM | POA: Diagnosis not present

## 2022-06-16 DIAGNOSIS — F902 Attention-deficit hyperactivity disorder, combined type: Secondary | ICD-10-CM

## 2022-06-16 MED ORDER — AMPHETAMINE-DEXTROAMPHET ER 15 MG PO CP24: 15.0000 mg | ORAL_CAPSULE | Freq: Every day | ORAL | 0 refills | Status: DC

## 2022-06-16 NOTE — Addendum Note (Signed)
Addended by: Dorothyann Gibbs on: 06/16/2022 12:58 PM   Modules accepted: Orders

## 2022-06-16 NOTE — Telephone Encounter (Signed)
[facility] sent PA request for Adderall XR 15mg  . See CMM

## 2022-06-16 NOTE — Progress Notes (Addendum)
Monica [name] [id number] [birth year] 52 y.o.  Subjective:   Patient ID:  Monica [name] is a 52 y.o. (DOB [birth year]) female.  Chief Complaint: No chief complaint on file.   HPI Monica [name] presents to the office today for follow-up of MDD, GAD, PTSD and insomnia.  Describes mood today as "so-so". Decreased tearfulness. Mood symptoms - reports decreased depression. Reports increased anxiety. Denies irritability. Reports decreased panic attacks. Reports some worry, rumination, and over thinking. Reports mood as "up and down" - "having some mood swings". Stating "I feel like my mood varies". [facility]. Working with an Building services engineerindividual therapist. Working with healthy weight and wellness. Feels like current medication regimen is helpful. Improved interest and motivation. Taking medications as prescribed.  Energy levels vary. Active, does not have a regular exercise routine. Walking daily. Enjoys some usual interests and activities. Single. Lives alone - with dog - daughter in school at [employer] - graduates Jul 17, 2022. Spending time with family and friends.  Appetite decreased. Weight stable - started on Saxenda. Sleep is variable - not sleeping through the night. Averages 6 to 8 hours - broken sleep - waking up and not feeling resting. Focus and concentration difficulties - has taken ADHD medication previously. Completing some tasks. Managing minimal aspects of household.  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  Denies SI or HI.  Denies AH or VH. Denies self harm. Denies substance use.  Previous medication trials: Adderall, Trazadone, Clonazepam, Paxil, Sertraline   Flowsheet Row [facility]  C-SSRS RISK CATEGORY No Risk High Risk        Review of Systems:  Review of Systems  Musculoskeletal:  Negative  for gait problem.  Neurological:  Negative for tremors.  Psychiatric/Behavioral:         Please refer to HPI    Medications: I have reviewed the patient's current medications.  Current Outpatient Medications  Medication Sig Dispense Refill   brexpiprazole (REXULTI) 1 MG TABS tablet Take 1 tablet (1 mg total) by mouth daily. 30 tablet 5   buPROPion (WELLBUTRIN XL) 150 MG 24 hr tablet TAKE THREE TABLETS EVERY MORNING. 270 tablet 1   cetirizine (ZYRTEC) 10 MG tablet Take 10 mg by mouth daily.     clonazePAM (KLONOPIN) 1 MG tablet TAKE ONE AND 1/2 TABLETS AT BEDTIME AS NEEDED FOR SLEEP. 45 tablet 2   escitalopram (LEXAPRO) 20 MG tablet Take 1 tablet (20 mg total) by mouth daily. 90 tablet 1   fluticasone (FLONASE) 50 MCG/ACT nasal spray Place 1 spray daily into both nostrils.     gabapentin (NEURONTIN) 300 MG capsule Take 1 capsule (300 mg total) by mouth 2 (two) times daily. 60 capsule 2   Insulin Pen Needle (B-D UF III MINI PEN NEEDLES) 31G X 5 MM MISC Use once daily as directed 100 each 0   levonorgestrel (MIRENA) 20 MCG/DAY IUD 1 each by Intrauterine route once.     liothyronine (CYTOMEL) 5 MCG tablet 2 tablet on an empty stomach Oral once a day     Liraglutide -Weight Management (SAXENDA) 18 MG/3ML SOPN Inject 3 mg into the skin daily. 45 mL 0   traZODone (DESYREL) 100 MG tablet Take 1-2 tablets (100-200 mg total) by mouth at bedtime. 180 tablet 1   valACYclovir (VALTREX) 500 MG tablet Take 500 mg by mouth 2 (two) times  daily as needed (flare up).     Vitamin D, Ergocalciferol, (DRISDOL) 1.25 MG (50000 UNIT) CAPS capsule Take 1 capsule (50,000 Units total) by mouth every 7 (seven) days. 4 capsule 0   No current facility-administered medications for this visit.    Medication Side Effects: None  Allergies:  Allergies  Allergen Reactions   Pristiq [Desvenlafaxine] Other (See Comments)    Not effective   Sertraline Other (See Comments)    Did not feel emotion   Pepto-Bismol [Bismuth  Subsalicylate] Rash   Septra [Sulfamethoxazole-Trimethoprim] Rash   Sulfa Antibiotics Rash    Past Medical History:  Diagnosis Date   ADD (attention deficit disorder)    Anxiety    Cholelithiases    symptomatic   Depression    Environmental and seasonal allergies    GAD (generalized anxiety disorder)    Gallbladder disease    Heart murmur    per pt told very faint , no symptoms, told nothing to worry about;  pt had echo done 12-24-2011 in epic ef 55-60% and trace MR   Herpes simplex type 1 infection    History of MRSA infection    left great toe in 2010   History of palpitations    ED visit in epic 05/ 2017;  evaluated by cardiology--- dr Demetrius Charity. Tenny Craw note in epic 08-31-2015 , ST on EKG , no further work-up;  pt had previou echo in epic 12-24-2011 ef 55-60%   History of suicidal behavior 03/2019   [facility] admission for IVC   Insomnia    unspecified   Palpitations    PTSD (post-traumatic stress disorder)    Wears glasses     Past Medical History, Surgical history, Social history, and Family history were reviewed and updated as appropriate.   Please see review of systems for further details on the patient's review from today.   Objective:   Physical Exam:  There were no vitals taken for this visit.  Physical Exam Constitutional:      General: She is not in acute distress. Musculoskeletal:        General: No deformity.  Neurological:     Mental Status: She is alert and oriented to person, place, and time.     Coordination: Coordination normal.  Psychiatric:        Attention and Perception: Attention and perception normal. She does not perceive auditory or visual hallucinations.        Mood and Affect: Mood normal. Mood is not anxious or depressed. Affect is not labile, blunt, angry or inappropriate.        Speech: Speech normal.        Behavior: Behavior normal.        Thought Content: Thought content normal. Thought content is not paranoid or delusional. Thought content does  not include homicidal or suicidal ideation. Thought content does not include homicidal or suicidal plan.        Cognition and Memory: Cognition and memory normal.        Judgment: Judgment normal.     Comments: Insight intact     Lab Review:     Component Value Date/Time   NA 140 05/22/2022 0941   K 4.5 05/22/2022 0941   CL 100 05/22/2022 0941   CO2 21 05/22/2022 0941   GLUCOSE 78 05/22/2022 0941   GLUCOSE 108 (H) 04/01/2019 2236   BUN 11 05/22/2022 0941   CREATININE 1.04 (H) 05/22/2022 0941   CALCIUM 9.7 05/22/2022 0941   PROT 7.3 05/22/2022 0941  ALBUMIN 4.5 05/22/2022 0941   AST 16 05/22/2022 0941   ALT 11 05/22/2022 0941   ALKPHOS 87 05/22/2022 0941   BILITOT 0.4 05/22/2022 0941   GFRNONAA >60 04/01/2019 2236   GFRAA >60 04/01/2019 2236       Component Value Date/Time   WBC 6.6 12/31/2021 1304   WBC 8.5 04/01/2019 2236   RBC 4.01 12/31/2021 1304   HGB 12.1 12/31/2021 1304   HCT 36.9 12/31/2021 1304   PLT 506 (H) 12/31/2021 1304   MCV 92.0 12/31/2021 1304   MCH 30.2 12/31/2021 1304   MCHC 32.8 12/31/2021 1304   RDW 15.2 12/31/2021 1304   LYMPHSABS 2.1 12/31/2021 1304   MONOABS 0.5 12/31/2021 1304   EOSABS 0.2 12/31/2021 1304   BASOSABS 0.1 12/31/2021 1304    No results found for: "POCLITH", "LITHIUM"   No results found for: "PHENYTOIN", "PHENOBARB", "VALPROATE", "CBMZ"   .res Assessment: Plan:    Plan:  PDMP reviewed  1. Lexapro 20mg  daily  2. Clonazepam 1.5mg  at hs 3. Trazdone 100mg  - 2 at hs 4. Wellbutrin XL 450mg  daily. Denies seizure history. 5. Rexulti 1 mg daily  6. Gabapentin 300mg  daily to BID  Restart Adderall XR 15mg  every morning for ADD symptoms.  Patient out of work from 06/15/2021 through 08/18/2021.   Completed IOP program at [facility]. Also seeing an individual therapist twice a week.   RTC 4 weeks  Patient advised to contact office with any questions, adverse effects, or acute worsening in signs and  symptoms.  Discussed potential benefits, risk, and side effects of benzodiazepines to include potential risk of tolerance and dependence, as well as possible drowsiness.  Advised patient not to drive if experiencing drowsiness and to take lowest possible effective dose to minimize risk of dependence and tolerance.  Discussed potential metabolic side effects associated with atypical antipsychotics, as well as potential risk for movement side effects. Advised pt to contact office if movement side effects occur.    Diagnoses and all orders for this visit:  Major depressive disorder, recurrent episode, moderate  Generalized anxiety disorder  Insomnia, unspecified type  PTSD (post-traumatic stress disorder)     Please see After Visit Summary for patient specific instructions.  Future Appointments  Date Time Provider Department Center  06/23/2022  3:40 PM Bowen, Scot Jun, DO [facility] None  07/03/2022  2:30 PM [facility] PHLEBOTOMIST [facility] None  07/03/2022  3:00 PM Ladene Artist, MD [facility] None  07/07/2022  2:00 PM Cristy Folks, PsyD [facility] None  07/30/2022  8:00 AM Robley Fries, PhD [facility] None    No orders of the defined types were placed in this encounter.   -------------------------------

## 2022-06-17 NOTE — Telephone Encounter (Signed)
Approval received for Amphetamine-dextroamphetamine ER 15 mg effective 06/17/2022-06/16/2025 with [facility], [facility].

## 2022-06-17 NOTE — Telephone Encounter (Signed)
I only see a PA for generic Adderall XR 15 mg,  Amphetamine Dextroamphetamine ER 15 mg capsules pending from [facility].

## 2022-06-18 ENCOUNTER — Ambulatory Visit: Payer: BC Managed Care – PPO | Admitting: Adult Health

## 2022-06-19 ENCOUNTER — Telehealth: Payer: Self-pay

## 2022-06-19 ENCOUNTER — Ambulatory Visit: Payer: BC Managed Care – PPO | Admitting: Behavioral Health

## 2022-06-19 NOTE — Telephone Encounter (Signed)
Paper work from [employer] completed and faxed with last office note from 06/16/2022

## 2022-06-23 ENCOUNTER — Other Ambulatory Visit (HOSPITAL_COMMUNITY): Payer: Self-pay

## 2022-06-23 ENCOUNTER — Ambulatory Visit (INDEPENDENT_AMBULATORY_CARE_PROVIDER_SITE_OTHER): Payer: BC Managed Care – PPO | Admitting: Family Medicine

## 2022-06-24 ENCOUNTER — Encounter (INDEPENDENT_AMBULATORY_CARE_PROVIDER_SITE_OTHER): Payer: Self-pay | Admitting: Family Medicine

## 2022-06-24 ENCOUNTER — Ambulatory Visit (INDEPENDENT_AMBULATORY_CARE_PROVIDER_SITE_OTHER): Payer: BC Managed Care – PPO | Admitting: Family Medicine

## 2022-06-24 ENCOUNTER — Other Ambulatory Visit (HOSPITAL_COMMUNITY): Payer: Self-pay

## 2022-06-24 VITALS — BP 98/67 | HR 89 | Temp 98.0°F | Ht 66.0 in | Wt 209.0 lb

## 2022-06-24 DIAGNOSIS — F5089 Other specified eating disorder: Secondary | ICD-10-CM | POA: Diagnosis not present

## 2022-06-24 DIAGNOSIS — R948 Abnormal results of function studies of other organs and systems: Secondary | ICD-10-CM

## 2022-06-24 DIAGNOSIS — E559 Vitamin D deficiency, unspecified: Secondary | ICD-10-CM

## 2022-06-24 DIAGNOSIS — F4325 Adjustment disorder with mixed disturbance of emotions and conduct: Secondary | ICD-10-CM

## 2022-06-24 DIAGNOSIS — E669 Obesity, unspecified: Secondary | ICD-10-CM

## 2022-06-24 DIAGNOSIS — Z6833 Body mass index (BMI) 33.0-33.9, adult: Secondary | ICD-10-CM

## 2022-06-24 MED ORDER — VITAMIN D (ERGOCALCIFEROL) 1.25 MG (50000 UNIT) PO CAPS
50000.0000 [IU] | ORAL_CAPSULE | ORAL | 0 refills | Status: DC
  Filled 2022-06-24 – 2022-07-10 (×2): qty 4, 28d supply, fill #0

## 2022-06-24 MED ORDER — ZEPBOUND 5 MG/0.5ML ~~LOC~~ SOAJ
5.0000 mg | SUBCUTANEOUS | 0 refills | Status: DC
  Filled 2022-06-24: qty 2, 28d supply, fill #0

## 2022-06-24 NOTE — Assessment & Plan Note (Signed)
Seeing improvements in mindfulness through cognitive behavioral therapy with Dr. Dewaine Conger.  Continue CBT with Dr. Dewaine Conger focusing on stress reduction, proper sleep at night and mindful eating.  Keep junk food triggers out of the house

## 2022-06-24 NOTE — Assessment & Plan Note (Signed)
She has seen weight loss with Saxenda but feels like it is not helping much with satiety and is frustrated by her plateau in weight loss.  She is currently injecting 1.8 mg once daily.  She agrees to discontinuing Saxenda and working on a prior authorization process for tirzepatide 5 mg once weekly injection.  We reviewed website including pen training information and savings card.  If this medication is not covered by insurance, we are limited with medication options.

## 2022-06-24 NOTE — Progress Notes (Signed)
Office: [phone]  /  Fax: [phone]  WEIGHT SUMMARY AND BIOMETRICS  Starting Date: 05/22/22  Starting Weight: 210lb   Weight Lost Since Last Visit: 1lb   Vitals Temp: 98 F (36.7 C) BP: 98/67 Pulse Rate: 89 SpO2: 96 %   Body Composition  Body Fat %: 45.2 % Fat Mass (lbs): 94.4 lbs Muscle Mass (lbs): 108.8 lbs Total Body Water (lbs): 76.6 lbs Visceral Fat Rating : 12   HPI  Chief Complaint: OBESITY  Monica [name] is here to discuss her progress with her obesity treatment plan. She is on the the Category 2 Plan and states she is following her eating plan approximately 90 % of the time. She states she is exercising 30 minutes 2 times per week.   Interval History:  Since last office visit she is down 1 lb She lost 1 lb of muscle She is looking for a job at [employer] She is sleeping well but is stressed out She is not skipping meals  She is having 2 eggs, low cal bread and cheese- feels full For lunch, she is having sandwich, fruit- feels full For dinner, she had chicken or [place] burger and veggies She snacks on string cheese, coffee + creamer- stopped , fruit Rarely eats out She has been frustrated by her lack of weight loss.   She has lost 14 pounds since February on Saxenda   Pharmacotherapy: Saxenda 1.8 mg daily injection  PHYSICAL EXAM:  Blood pressure 98/67, pulse 89, temperature 98 F (36.7 C), height  (1.676 m), weight 209 lb (94.8 kg), SpO2 96 %. Body mass index is 33.73 kg/m.  General: She is overweight, cooperative, alert, well developed, and in no acute distress. PSYCH: Has normal mood, affect and thought process.   Lungs: Normal breathing effort, no conversational dyspnea.   ASSESSMENT AND PLAN  TREATMENT PLAN FOR OBESITY:  Recommended Dietary Goals  Monica [name] is currently in the action stage of change. As such, her goal is to continue weight management plan. She has agreed to the Category 2 Plan.  Behavioral Intervention  We  discussed the following Behavioral Modification Strategies today: increasing lean protein intake, avoiding skipping meals, increasing water intake, work on meal planning and preparation, work on tracking and journaling calories using tracking application, emotional eating strategies and understanding the difference between hunger signals and cravings, work on managing stress, creating time for self-care and relaxation measures, avoiding temptations and identifying enticing environmental cues, and planning for success.  Additional resources provided today: NA  Recommended Physical Activity Goals  Monica [name] has been advised to work up to 150 minutes of moderate intensity aerobic activity a week and strengthening exercises 2-3 times per week for cardiovascular health, weight loss maintenance and preservation of muscle mass.   She has agreed to [employer] strengthening exercises with a goal of 2-3 sessions a week   Pharmacotherapy changes for the treatment of obesity: Discontinue Saxenda.  Begin tirzepatide 5 mg once weekly injection  ASSOCIATED CONDITIONS ADDRESSED TODAY  Vitamin D deficiency Assessment & Plan: Last vitamin D Lab Results  Component Value Date   VD25OH 29.7 (L) 05/22/2022   Taking ergocalciferol 50,000 IU once weekly.  Energy level remains fairly low.  Plan: Refilled ergocalciferol 50,000 IU once weekly.  Recheck level in the next 3 months  Orders: -     Vitamin D (Ergocalciferol); Take 1 capsule (50,000 Units total) by mouth every 7 (seven) days.  Dispense: 4 capsule; Refill: 0  Generalized obesity Assessment & Plan: She has  seen weight loss with Saxenda but feels like it is not helping much with satiety and is frustrated by her plateau in weight loss.  She is currently injecting 1.8 mg once daily.  She agrees to discontinuing Saxenda and working on a prior authorization process for tirzepatide 5 mg once weekly injection.  We reviewed website including pen training information  and savings card.  If this medication is not covered by insurance, we are limited with medication options.  Orders: -     Zepbound; Inject 5 mg into the skin once a week.  Dispense: 2 mL; Refill: 0  BMI 33.0-33.9,adult  Other Specified Feeding or Eating Disorder, Emotional and Binge Eating Behaviors  Low basal metabolic rate Assessment & Plan: Patient has a low metabolic rate with a resting energy expenditure of 1282 kcal/day from her first visit 3/14. Her low metabolic rate can explain her slow weight loss.  She has a long history of restrictive eating and lack of regular exercise.  We have discussed the importance of eating on a schedule, getting adequate protein, proper sleep at night and regular exercise including both cardio and resistance training. Will keep her daily caloric intake around 1200/day which should include 85 g protein daily   Adjustment disorder with mixed disturbance of emotions and conduct Assessment & Plan: Seeing improvements in mindfulness through cognitive behavioral therapy with Dr. Dewaine Conger.  Continue CBT with Dr. Dewaine Conger focusing on stress reduction, proper sleep at night and mindful eating.  Keep junk food triggers out of the house       She was informed of the importance of frequent follow up visits to maximize her success with intensive lifestyle modifications for her multiple health conditions.   ATTESTASTION STATEMENTS:  Reviewed by clinician on day of visit: allergies, medications, problem list, medical history, surgical history, family history, social history, and previous encounter notes pertinent to obesity diagnosis.   I have personally spent 30 minutes total time today in preparation, patient care, nutritional counseling and documentation for this visit, including the following: review of clinical lab tests; review of medical tests/procedures/services.      Monica Brink, DO DABFM, DABOM [facility] [address]  [place], [place] [address] [phone]

## 2022-06-24 NOTE — Assessment & Plan Note (Signed)
Patient has a low metabolic rate with a resting energy expenditure of 1282 kcal/day from her first visit 3/14. Her low metabolic rate can explain her slow weight loss.  She has a long history of restrictive eating and lack of regular exercise.  We have discussed the importance of eating on a schedule, getting adequate protein, proper sleep at night and regular exercise including both cardio and resistance training. Will keep her daily caloric intake around 1200/day which should include 85 g protein daily

## 2022-06-24 NOTE — Assessment & Plan Note (Signed)
Last vitamin D Lab Results  Component Value Date   VD25OH 29.7 (L) 05/22/2022   Taking ergocalciferol 50,000 IU once weekly.  Energy level remains fairly low.  Plan: Refilled ergocalciferol 50,000 IU once weekly.  Recheck level in the next 3 months

## 2022-06-25 ENCOUNTER — Other Ambulatory Visit (HOSPITAL_COMMUNITY): Payer: Self-pay

## 2022-06-30 ENCOUNTER — Telehealth (INDEPENDENT_AMBULATORY_CARE_PROVIDER_SITE_OTHER): Payer: Self-pay | Admitting: Family Medicine

## 2022-06-30 NOTE — Telephone Encounter (Signed)
Prior authorization started through cover my meds for patients Zepbound. Waiting on determination.

## 2022-07-03 ENCOUNTER — Inpatient Hospital Stay: Payer: BC Managed Care – PPO | Attending: Oncology | Admitting: Oncology

## 2022-07-03 ENCOUNTER — Inpatient Hospital Stay: Payer: BC Managed Care – PPO

## 2022-07-03 ENCOUNTER — Other Ambulatory Visit (HOSPITAL_COMMUNITY): Payer: Self-pay

## 2022-07-03 VITALS — BP 98/70 | HR 98 | Temp 97.9°F | Resp 18 | Ht 66.0 in | Wt 210.0 lb

## 2022-07-03 DIAGNOSIS — E039 Hypothyroidism, unspecified: Secondary | ICD-10-CM | POA: Insufficient documentation

## 2022-07-03 DIAGNOSIS — J302 Other seasonal allergic rhinitis: Secondary | ICD-10-CM | POA: Diagnosis not present

## 2022-07-03 DIAGNOSIS — F419 Anxiety disorder, unspecified: Secondary | ICD-10-CM | POA: Diagnosis not present

## 2022-07-03 DIAGNOSIS — D75839 Thrombocytosis, unspecified: Secondary | ICD-10-CM | POA: Diagnosis not present

## 2022-07-03 LAB — CBC WITH DIFFERENTIAL (CANCER CENTER ONLY)
Abs Immature Granulocytes: 0.02 10*3/uL (ref 0.00–0.07)
Basophils Absolute: 0.1 10*3/uL (ref 0.0–0.1)
Basophils Relative: 1 %
Eosinophils Absolute: 0.1 10*3/uL (ref 0.0–0.5)
Eosinophils Relative: 2 %
HCT: 37.4 % (ref 36.0–46.0)
Hemoglobin: 12.7 g/dL (ref 12.0–15.0)
Immature Granulocytes: 0 %
Lymphocytes Relative: 31 %
Lymphs Abs: 2.3 10*3/uL (ref 0.7–4.0)
MCH: 29.9 pg (ref 26.0–34.0)
MCHC: 34 g/dL (ref 30.0–36.0)
MCV: 88 fL (ref 80.0–100.0)
Monocytes Absolute: 0.5 10*3/uL (ref 0.1–1.0)
Monocytes Relative: 6 %
Neutro Abs: 4.5 10*3/uL (ref 1.7–7.7)
Neutrophils Relative %: 60 %
Platelet Count: 495 10*3/uL — ABNORMAL HIGH (ref 150–400)
RBC: 4.25 MIL/uL (ref 3.87–5.11)
RDW: 13.8 % (ref 11.5–15.5)
WBC Count: 7.5 10*3/uL (ref 4.0–10.5)
nRBC: 0 % (ref 0.0–0.2)

## 2022-07-03 NOTE — Progress Notes (Signed)
  [facility] OFFICE PROGRESS NOTE   Diagnosis: Thrombocytosis  INTERVAL HISTORY:   Ms. Parco returns as scheduled.  She generally feels well.  She reports the most recent menstrual cycle was heavier and longer than usual.  No other bleeding.  No recent infection.  No swollen or painful joints.  She is followed in the wellness clinic.  Objective:  Vital signs in last 24 hours:  Blood pressure 98/70, pulse 98, temperature 97.9 F (36.6 C), temperature source Oral, resp. rate 18, height  (1.676 m), weight 210 lb (95.3 kg), SpO2 100 %.    HEENT: Tongue without erythema Resp: Lungs clear bilaterally Cardio: Regular rate and rhythm GI: No hepatosplenomegaly Vascular: No leg edema   Lab Results:  Lab Results  Component Value Date   WBC 7.5 07/03/2022   HGB 12.7 07/03/2022   HCT 37.4 07/03/2022   MCV 88.0 07/03/2022   PLT 495 (H) 07/03/2022   NEUTROABS 4.5 07/03/2022    CMP  Lab Results  Component Value Date   NA 140 05/22/2022   K 4.5 05/22/2022   CL 100 05/22/2022   CO2 21 05/22/2022   GLUCOSE 78 05/22/2022   BUN 11 05/22/2022   CREATININE 1.04 (H) 05/22/2022   CALCIUM 9.7 05/22/2022   PROT 7.3 05/22/2022   ALBUMIN 4.5 05/22/2022   AST 16 05/22/2022   ALT 11 05/22/2022   ALKPHOS 87 05/22/2022   BILITOT 0.4 05/22/2022   GFRNONAA >60 04/01/2019   GFRAA >60 04/01/2019    Medications: I have reviewed the patient's current medications.   Assessment/Plan: Thrombocytosis Thrombocytosis dates to at least December 2022 based on available laboratory data, and 2021per patient report Negative myeloproliferative panel 12/31/2021 Ferritin normal 12/31/2021 Seasonal allergies Hypothyroidism G1, P1 Anxiety disorder Herpes simplex  COVID 26 November 2021   Disposition: Ms. Rosamond has persistent mild thrombocytosis.  The myeloproliferative disorder panel was negative in October 2023 arguing against a diagnosis of essential thrombocytosis.  She  does not appear to have iron deficiency.  There are no clinical symptoms to suggest a systemic inflammatory condition.  I do not recommend further hematologic evaluation at present.  She will return for an office visit, CBC, and ferritin level in 9 months.  She plans to see Dr. Docia Chuck in the interim.  Thornton Papas, MD  07/03/2022  3:28 PM

## 2022-07-07 ENCOUNTER — Telehealth (INDEPENDENT_AMBULATORY_CARE_PROVIDER_SITE_OTHER): Payer: BC Managed Care – PPO | Admitting: Psychology

## 2022-07-07 DIAGNOSIS — F32A Depression, unspecified: Secondary | ICD-10-CM | POA: Diagnosis not present

## 2022-07-07 DIAGNOSIS — F5089 Other specified eating disorder: Secondary | ICD-10-CM | POA: Diagnosis not present

## 2022-07-07 NOTE — Progress Notes (Signed)
  Office: [phone]  /  Fax: [phone]    Date: July 07, 2022    Appointment Start Time: 2:01pm Duration: 30 minutes Provider: Lawerance Cruel, Psy.D. Type of Session: Individual Therapy  Location of Patient: Home (private location) Location of Provider: Provider's Home (private office) Type of Contact: Telepsychological Visit via MyChart Video Visit  Session Content: Monica [name] is a 52 y.o. female presenting for a follow-up appointment to address the previously established treatment goal of increasing coping skills.Today's appointment was a telepsychological visit. Monica [name] provided verbal consent for today's telepsychological appointment and she is aware she is responsible for securing confidentiality on her end of the session. Prior to proceeding with today's appointment, Monica [name]'s physical location at the time of this appointment was obtained as well a phone number she could be reached at in the event of technical difficulties. Monica [name] and this provider participated in today's telepsychological service.   This provider conducted a brief check-in. Monica [name] shared about recent stressors, including Monica [name] not being approved by her insurance. She will discuss further with Monica [name]. Despite an increase in cravings, she denied engagement emotional or binge eating behaviors. Notably, she discussed using urge surfing to help her cope. Psychoeducation regarding triggers for emotional eating was provided. Monica [name] was provided a handout, and encouraged to utilize the handout between now and the next appointment to increase awareness of triggers and frequency. Monica [name] agreed. This provider also discussed behavioral strategies for specific triggers, such as placing the utensil down when conversing to avoid mindless eating. Monica [name] provided verbal consent during today's appointment for this provider to send a handout about triggers via e-mail. Discussed strategies for upcoming celebrations. Overall, Monica [name]  was receptive to today's appointment as evidenced by openness to sharing, responsiveness to feedback, and willingness to explore triggers for emotional eating.  Mental Status Examination:  Appearance: neat Behavior: appropriate to circumstances Mood: "anxious" Affect: mood congruent Speech: WNL Eye Contact: appropriate Psychomotor Activity: WNL Gait: unable to assess Thought Process: linear, logical, and goal directed and denies suicidal, homicidal, and self-harm ideation, plan and intent  Thought Content/Perception: no hallucinations, delusions, bizarre thinking or behavior endorsed or observed Orientation: AAOx4 Memory/Concentration: memory, attention, language, and fund of knowledge intact  Insight: fair Judgment: fair  Interventions:  Conducted a brief chart review Conducted a risk assessment Provided empathic reflections and validation Reviewed content from the previous session Provided positive reinforcement Employed supportive psychotherapy interventions to facilitate reduced distress and to improve coping skills with identified stressors Psychoeducation provided regarding triggers for emotional eating behaviors  DSM-5 Diagnosis(es):  F50.89 Other Specified Feeding or Eating Disorder, Emotional and Binge Eating Behaviors and  F32.A Unspecified Depressive Disorder  Treatment Goal & Progress: During the initial appointment with this provider, the following treatment goal was established: increase coping skills. Monica [name] has demonstrated progress in her goal as evidenced by increased awareness of hunger patterns, reduction in emotional eating behaviors , and reduction in binge eating behaviors. Monica [name] also continues to demonstrate willingness to engage in learned skill(s).  Plan: The next appointment is scheduled for 07/22/2022 at 11:30am, which will be via MyChart Video Visit. The next session will focus on working towards the established treatment goal. Monica [name] will be initiating  therapeutic services with [facility] on May 22nd.

## 2022-07-08 ENCOUNTER — Other Ambulatory Visit (HOSPITAL_COMMUNITY): Payer: Self-pay

## 2022-07-08 ENCOUNTER — Ambulatory Visit (INDEPENDENT_AMBULATORY_CARE_PROVIDER_SITE_OTHER): Payer: BC Managed Care – PPO | Admitting: Family Medicine

## 2022-07-08 ENCOUNTER — Encounter (INDEPENDENT_AMBULATORY_CARE_PROVIDER_SITE_OTHER): Payer: Self-pay | Admitting: Family Medicine

## 2022-07-08 VITALS — BP 95/64 | HR 104 | Temp 98.7°F | Ht 66.0 in | Wt 206.0 lb

## 2022-07-08 DIAGNOSIS — F5081 Binge eating disorder: Secondary | ICD-10-CM | POA: Diagnosis not present

## 2022-07-08 DIAGNOSIS — E669 Obesity, unspecified: Secondary | ICD-10-CM

## 2022-07-08 DIAGNOSIS — E559 Vitamin D deficiency, unspecified: Secondary | ICD-10-CM

## 2022-07-08 DIAGNOSIS — Z6833 Body mass index (BMI) 33.0-33.9, adult: Secondary | ICD-10-CM | POA: Diagnosis not present

## 2022-07-08 MED ORDER — TOPIRAMATE 25 MG PO TABS
25.0000 mg | ORAL_TABLET | Freq: Every day | ORAL | 0 refills | Status: DC
Start: 2022-07-08 — End: 2022-07-31
  Filled 2022-07-08: qty 30, 30d supply, fill #0

## 2022-07-08 NOTE — Progress Notes (Signed)
Office: [phone]  /  Fax: [phone]  WEIGHT SUMMARY AND BIOMETRICS  Starting Date: 05/22/22  Starting Weight: 210 lb   Weight Lost Since Last Visit: 3 lb   Vitals Temp: 98.7 F (37.1 C) BP: 95/64 Pulse Rate: (!) 104 SpO2: 96 %   Body Composition  Body Fat %: 44.4 % Fat Mass (lbs): 91.6 lbs Muscle Mass (lbs): 109 lbs Total Body Water (lbs): 74.4 lbs Visceral Fat Rating : 11     HPI  Chief Complaint: OBESITY  Monica [name] is here to discuss her progress with her obesity treatment plan. She is on the the Category 2 Plan and states she is following her eating plan approximately 95 % of the time. She states she is exercising /more active 7 times per week.   Interval History:  Since last office visit she is down 3 lb She has a net weight loss of 4 lb in the past month She was unable to get Zepbound and insurance no longer covers AOMs She has been walking her dog more She is finishing out her [employer]- injecting 1.2 mg daily She is doing CBT with Monica [name] Recently started Adderall for ADHD and appetite has reduced.  She is hungrier at night as it wears off.  She is not skipping meals.  Pharmacotherapy:  Saxenda 1.2 mg daily (running out)  PHYSICAL EXAM:  Blood pressure 95/64, pulse (!) 104, temperature 98.7 F (37.1 C), height 5\' 6"  (1.676 m), weight 206 lb (93.4 kg), SpO2 96 %. Body mass index is 33.25 kg/m.  General: She is overweight, cooperative, alert, well developed, and in no acute distress. PSYCH: Has normal mood, affect and thought process.   Lungs: Normal breathing effort, no conversational dyspnea.   ASSESSMENT AND PLAN  TREATMENT PLAN FOR OBESITY:  Recommended Dietary Goals  Monica [name] is currently in the action stage of change. As such, her goal is to continue weight management plan. She has agreed to the Category 2 Plan.  Behavioral Intervention  We discussed the following Behavioral Modification Strategies today: increasing lean  protein intake, decreasing simple carbohydrates , increasing vegetables, increasing lower glycemic fruits, increasing water intake, work on meal planning and preparation, emotional eating strategies and understanding the difference between hunger signals and cravings, work on managing stress, creating time for self-care and relaxation measures, avoiding temptations and identifying enticing environmental cues, continue to practice mindfulness when eating, and planning for success.  Additional resources provided today: NA  Recommended Physical Activity Goals  Monica [name] has been advised to work up to 150 minutes of moderate intensity aerobic activity a week and strengthening exercises 2-3 times per week for cardiovascular health, weight loss maintenance and preservation of muscle mass.   She has agreed to Start aerobic activity with a goal of 150 minutes a week at moderate intensity.   Pharmacotherapy changes for the treatment of obesity: begin Topamax 25 mg daily in the evening  ASSOCIATED CONDITIONS ADDRESSED TODAY  Binge eating disorder Assessment & Plan: She is benefiting from CBT with Monica [name], working on her mental health and mindful eating.  She is keeping junk food out of the house.  She is doing better with eating her meals including lean protein and fiber with meals.    Continue CBT with Monica [name].  Practice positive self talk and mindful eating.  Begin Topamax 25 mg in the evening for food impulse control and cravings.  She has never had kidney stones and is not at risk for pregnancy.  Reviewed potential  adverse side effects.  Orders: -     Topiramate; Take 1 tablet (25 mg total) by mouth daily.  Dispense: 30 tablet; Refill: 0  Generalized obesity Assessment & Plan: She has a net weight loss of 4 lb in 1 month of medically supervised weight management and has maintained her lean body mass.  She is doing well on her prescribed meal plan with adequate satiety.  Her insurance no longer  covers AOMs and she never started Zepbound.  She is finishing up Saxenda that was started by her PCP in Feb, down from starting weight of 223 lb.  She is currently injecting 1.2 mg daily without problems but will run out in the next 2 weeks. She is apprehensive about seeing  a rise in appetite.    Continue Cat 2 meal plan, adding in walking for a goal of 30 min 5 days/ wk.   BMI 33.0-33.9,adult  Vitamin D deficiency Assessment & Plan: Last vitamin D Lab Results  Component Value Date   VD25OH 29.7 (L) 05/22/2022   Doing well on RX vitamin D 50,000 IU weekly.  She has improved her compliance taking it.  Energy levels are starting to improve.  Recheck vitamin D level in July.       She was informed of the importance of frequent follow up visits to maximize her success with intensive lifestyle modifications for her multiple health conditions.   ATTESTASTION STATEMENTS:  Reviewed by clinician on day of visit: allergies, medications, problem list, medical history, surgical history, family history, social history, and previous encounter notes pertinent to obesity diagnosis.   I have personally spent 30 minutes total time today in preparation, patient care, nutritional counseling and documentation for this visit, including the following: review of clinical lab tests; review of medical tests/procedures/services.      Monica Brink, DO DABFM, DABOM [facility] [address] [place], [place] [address] [phone]

## 2022-07-08 NOTE — Assessment & Plan Note (Signed)
She has a net weight loss of 4 lb in 1 month of medically supervised weight management and has maintained her lean body mass.  She is doing well on her prescribed meal plan with adequate satiety.  Her insurance no longer covers AOMs and she never started Zepbound.  She is finishing up Saxenda that was started by her PCP in Feb, down from starting weight of 223 lb.  She is currently injecting 1.2 mg daily without problems but will run out in the next 2 weeks. She is apprehensive about seeing  a rise in appetite.    Continue Cat 2 meal plan, adding in walking for a goal of 30 min 5 days/ wk.

## 2022-07-08 NOTE — Assessment & Plan Note (Signed)
She is benefiting from CBT with Dr Dewaine Conger, working on her mental health and mindful eating.  She is keeping junk food out of the house.  She is doing better with eating her meals including lean protein and fiber with meals.    Continue CBT with Dr Dewaine Conger.  Practice positive self talk and mindful eating.  Begin Topamax 25 mg in the evening for food impulse control and cravings.  She has never had kidney stones and is not at risk for pregnancy.  Reviewed potential adverse side effects.

## 2022-07-08 NOTE — Assessment & Plan Note (Signed)
Last vitamin D Lab Results  Component Value Date   VD25OH 29.7 (L) 05/22/2022   Doing well on RX vitamin D 50,000 IU weekly.  She has improved her compliance taking it.  Energy levels are starting to improve.  Recheck vitamin D level in July.

## 2022-07-10 ENCOUNTER — Other Ambulatory Visit (HOSPITAL_COMMUNITY): Payer: Self-pay

## 2022-07-15 ENCOUNTER — Encounter: Payer: Self-pay | Admitting: Adult Health

## 2022-07-15 ENCOUNTER — Ambulatory Visit (INDEPENDENT_AMBULATORY_CARE_PROVIDER_SITE_OTHER): Payer: BC Managed Care – PPO | Admitting: Adult Health

## 2022-07-15 DIAGNOSIS — F902 Attention-deficit hyperactivity disorder, combined type: Secondary | ICD-10-CM

## 2022-07-15 DIAGNOSIS — F4310 Post-traumatic stress disorder, unspecified: Secondary | ICD-10-CM | POA: Diagnosis not present

## 2022-07-15 DIAGNOSIS — G4700 Insomnia, unspecified: Secondary | ICD-10-CM

## 2022-07-15 DIAGNOSIS — F411 Generalized anxiety disorder: Secondary | ICD-10-CM

## 2022-07-15 DIAGNOSIS — F331 Major depressive disorder, recurrent, moderate: Secondary | ICD-10-CM

## 2022-07-15 MED ORDER — AMPHETAMINE-DEXTROAMPHET ER 15 MG PO CP24: 15.0000 mg | ORAL_CAPSULE | Freq: Every day | ORAL | 0 refills | Status: DC

## 2022-07-15 MED ORDER — TRAZODONE HCL 100 MG PO TABS
100.0000 mg | ORAL_TABLET | Freq: Every day | ORAL | 1 refills | Status: DC
Start: 2022-07-15 — End: 2022-08-14

## 2022-07-15 NOTE — Progress Notes (Addendum)
Monica [name] [id number] [birth year] 52 y.o.  Subjective:   Patient ID:  Monica [name] is a 52 y.o. (DOB 1970-10-16) female.  Chief Complaint: No chief complaint on file.   HPI Monica [name] presents to the office today for follow-up of MDD, GAD, PTSD, ADD, and insomnia.  Describes mood today as "improving". Decreased tearfulness. Mood symptoms - reports decreased depression. Reports anxiety - worse when leaving the house. Denies irritability. Reports decreased panic attacks. Reports some worry, rumination, and over thinking. Reports mood starting to improve - "leveling off". Stating "I feel like things are starting to get better". Feels like current medication regimen is helpful. Working with an Building services engineer. Improved interest and motivation. Taking medications as prescribed.  Energy levels vary. Active, does not have a regular exercise routine. Walking daily. Enjoys some usual interests and activities. Single. Lives alone - with dog - daughter in school at [employer] - graduates Jul 17, 2022. Spending time with family and friends.  Appetite decreased. Reports weight loss. Sleep is variable.Averages 6 to 8 hours - broken sleep. Focus and concentration improved with adding Adderall. Completing some tasks. Managing minimal aspects of household.  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  Denies SI or HI.  Denies AH or VH. Denies self harm. Denies substance use.  Previous medication trials: Adderall, Trazadone, Clonazepam, Paxil, Sertraline   Flowsheet Row Admission (Discharged) from 09/03/2020 in [facility] ED from 04/01/2019 in [facility] Emergency Department at [facility]  C-SSRS RISK CATEGORY No Risk High Risk       Review of Systems:  Review of Systems  Musculoskeletal:  Negative for gait problem.  Neurological:  Negative for tremors.  Psychiatric/Behavioral:         Please refer to HPI    Medications: I  have reviewed the patient's current medications.  Current Outpatient Medications  Medication Sig Dispense Refill   amphetamine-dextroamphetamine (ADDERALL XR) 15 MG 24 hr capsule Take 1 capsule by mouth daily. 30 capsule 0   brexpiprazole (REXULTI) 1 MG TABS tablet Take 1 tablet (1 mg total) by mouth daily. 30 tablet 5   buPROPion (WELLBUTRIN XL) 150 MG 24 hr tablet TAKE THREE TABLETS EVERY MORNING. 270 tablet 1   cetirizine (ZYRTEC) 10 MG tablet Take 10 mg by mouth daily.     clonazePAM (KLONOPIN) 1 MG tablet TAKE ONE AND 1/2 TABLETS AT BEDTIME AS NEEDED FOR SLEEP. 45 tablet 2   escitalopram (LEXAPRO) 20 MG tablet Take 1 tablet (20 mg total) by mouth daily. 90 tablet 1   fluticasone (FLONASE) 50 MCG/ACT nasal spray Place 1 spray daily into both nostrils.     gabapentin (NEURONTIN) 300 MG capsule Take 1 capsule (300 mg total) by mouth 2 (two) times daily. 60 capsule 2   Insulin Pen Needle (B-D UF III MINI PEN NEEDLES) 31G X 5 MM MISC Use once daily as directed 100 each 0   levonorgestrel (MIRENA) 20 MCG/DAY IUD 1 each by Intrauterine route once.     topiramate (TOPAMAX) 25 MG tablet Take 1 tablet (25 mg total) by mouth daily. 30 tablet 0   traZODone (DESYREL) 100 MG tablet Take 1-2 tablets (100-200 mg total) by mouth at bedtime. 180 tablet 1   valACYclovir (VALTREX) 500 MG tablet Take 500 mg by mouth 2 (two) times daily as needed (flare up).     Vitamin D, Ergocalciferol, (DRISDOL) 1.25 MG (50000 UNIT) CAPS capsule Take 1 capsule (50,000 Units  total) by mouth every 7 (seven) days. 4 capsule 0   No current facility-administered medications for this visit.    Medication Side Effects: None  Allergies:  Allergies  Allergen Reactions   Pristiq [Desvenlafaxine] Other (See Comments)    Not effective   Sertraline Other (See Comments)    Did not feel emotion   Pepto-Bismol [Bismuth Subsalicylate] Rash   Septra [Sulfamethoxazole-Trimethoprim] Rash   Sulfa Antibiotics Rash    Past Medical  History:  Diagnosis Date   ADD (attention deficit disorder)    Anxiety    Cholelithiases    symptomatic   Depression    Environmental and seasonal allergies    GAD (generalized anxiety disorder)    Gallbladder disease    Heart murmur    per pt told very faint , no symptoms, told nothing to worry about;  pt had echo done 12-24-2011 in epic ef 55-60% and trace MR   Herpes simplex type 1 infection    History of MRSA infection    left great toe in 2010   History of palpitations    ED visit in epic 05/ 2017;  evaluated by cardiology--- dr Demetrius Charity. Tenny Craw note in epic 08-31-2015 , ST on EKG , no further work-up;  pt had previou echo in epic 12-24-2011 ef 55-60%   History of suicidal behavior 03/2019   [facility] admission for IVC   Insomnia    unspecified   Palpitations    PTSD (post-traumatic stress disorder)    Wears glasses     Past Medical History, Surgical history, Social history, and Family history were reviewed and updated as appropriate.   Please see review of systems for further details on the patient's review from today.   Objective:   Physical Exam:  There were no vitals taken for this visit.  Physical Exam Constitutional:      General: She is not in acute distress. Musculoskeletal:        General: No deformity.  Neurological:     Mental Status: She is alert and oriented to person, place, and time.     Coordination: Coordination normal.  Psychiatric:        Attention and Perception: Attention and perception normal. She does not perceive auditory or visual hallucinations.        Mood and Affect: Mood normal. Mood is not anxious or depressed. Affect is not labile, blunt, angry or inappropriate.        Speech: Speech normal.        Behavior: Behavior normal.        Thought Content: Thought content normal. Thought content is not paranoid or delusional. Thought content does not include homicidal or suicidal ideation. Thought content does not include homicidal or suicidal plan.         Cognition and Memory: Cognition and memory normal.        Judgment: Judgment normal.     Comments: Insight intact     Lab Review:     Component Value Date/Time   NA 140 05/22/2022 0941   K 4.5 05/22/2022 0941   CL 100 05/22/2022 0941   CO2 21 05/22/2022 0941   GLUCOSE 78 05/22/2022 0941   GLUCOSE 108 (H) 04/01/2019 2236   BUN 11 05/22/2022 0941   CREATININE 1.04 (H) 05/22/2022 0941   CALCIUM 9.7 05/22/2022 0941   PROT 7.3 05/22/2022 0941   ALBUMIN 4.5 05/22/2022 0941   AST 16 05/22/2022 0941   ALT 11 05/22/2022 0941   ALKPHOS 87 05/22/2022 0941  BILITOT 0.4 05/22/2022 0941   GFRNONAA >60 04/01/2019 2236   GFRAA >60 04/01/2019 2236       Component Value Date/Time   WBC 7.5 07/03/2022 1431   WBC 8.5 04/01/2019 2236   RBC 4.25 07/03/2022 1431   HGB 12.7 07/03/2022 1431   HCT 37.4 07/03/2022 1431   PLT 495 (H) 07/03/2022 1431   MCV 88.0 07/03/2022 1431   MCH 29.9 07/03/2022 1431   MCHC 34.0 07/03/2022 1431   RDW 13.8 07/03/2022 1431   LYMPHSABS 2.3 07/03/2022 1431   MONOABS 0.5 07/03/2022 1431   EOSABS 0.1 07/03/2022 1431   BASOSABS 0.1 07/03/2022 1431    No results found for: "POCLITH", "LITHIUM"   No results found for: "PHENYTOIN", "PHENOBARB", "VALPROATE", "CBMZ"   .res Assessment: Plan:    Plan:  PDMP reviewed  1. Lexapro 20mg  daily  2. Clonazepam 1.5mg  at hs 3. Trazdone 100mg  - 2 at hs 4. Wellbutrin XL 450mg  daily. Denies seizure history. 5. Rexulti 1 mg daily  6. Gabapentin 300mg  daily to BID 7. Adderall XR 15mg  every morning for ADD symptoms.  Patient out of work from 06/15/2021 through 08/18/2021.   RTC 4 weeks  Patient advised to contact office with any questions, adverse effects, or acute worsening in signs and symptoms.  Discussed potential benefits, risk, and side effects of benzodiazepines to include potential risk of tolerance and dependence, as well as possible drowsiness.  Advised patient not to drive if experiencing drowsiness and  to take lowest possible effective dose to minimize risk of dependence and tolerance.  Discussed potential metabolic side effects associated with atypical antipsychotics, as well as potential risk for movement side effects. Advised pt to contact office if movement side effects occur.    Diagnoses and all orders for this visit:  Major depressive disorder, recurrent episode, moderate (HCC)  Generalized anxiety disorder  Insomnia, unspecified type -     traZODone (DESYREL) 100 MG tablet; Take 1-2 tablets (100-200 mg total) by mouth at bedtime.  PTSD (post-traumatic stress disorder)  Attention deficit hyperactivity disorder (ADHD), combined type -     amphetamine-dextroamphetamine (ADDERALL XR) 15 MG 24 hr capsule; Take 1 capsule by mouth daily.     Please see After Visit Summary for patient specific instructions.  Future Appointments  Date Time Provider Department Center  07/22/2022 11:30 AM Cristy Folks, PsyD [facility] None  07/24/2022 12:20 PM Bowen, Scot Jun, DO [facility] None  07/30/2022  8:00 AM Robley Fries, PhD [facility] None  03/12/2023 11:30 AM [facility] PHLEBOTOMIST [facility] None  03/12/2023 12:00 PM Ladene Artist, MD [facility] None    No orders of the defined types were placed in this encounter.   -------------------------------

## 2022-07-15 NOTE — Addendum Note (Signed)
Addended by: Dorothyann Gibbs on: 07/15/2022 12:00 PM   Modules accepted: Orders

## 2022-07-22 ENCOUNTER — Telehealth (INDEPENDENT_AMBULATORY_CARE_PROVIDER_SITE_OTHER): Payer: BC Managed Care – PPO | Admitting: Psychology

## 2022-07-24 ENCOUNTER — Ambulatory Visit (INDEPENDENT_AMBULATORY_CARE_PROVIDER_SITE_OTHER): Payer: BC Managed Care – PPO | Admitting: Family Medicine

## 2022-07-29 ENCOUNTER — Other Ambulatory Visit: Payer: Self-pay | Admitting: Nurse Practitioner

## 2022-07-29 ENCOUNTER — Other Ambulatory Visit (HOSPITAL_COMMUNITY)
Admission: RE | Admit: 2022-07-29 | Discharge: 2022-07-29 | Disposition: A | Discharge status: Home / Self Care | Payer: BC Managed Care – PPO | Source: Ambulatory Visit | Attending: Nurse Practitioner | Admitting: Nurse Practitioner

## 2022-07-29 DIAGNOSIS — Z124 Encounter for screening for malignant neoplasm of cervix: Secondary | ICD-10-CM | POA: Insufficient documentation

## 2022-07-30 ENCOUNTER — Other Ambulatory Visit: Payer: Self-pay | Admitting: Nurse Practitioner

## 2022-07-30 ENCOUNTER — Ambulatory Visit: Payer: BC Managed Care – PPO | Admitting: Psychiatry

## 2022-07-30 DIAGNOSIS — Z0000 Encounter for general adult medical examination without abnormal findings: Secondary | ICD-10-CM

## 2022-07-31 ENCOUNTER — Encounter (INDEPENDENT_AMBULATORY_CARE_PROVIDER_SITE_OTHER): Payer: Self-pay | Admitting: Family Medicine

## 2022-07-31 ENCOUNTER — Ambulatory Visit (INDEPENDENT_AMBULATORY_CARE_PROVIDER_SITE_OTHER): Payer: BC Managed Care – PPO | Admitting: Family Medicine

## 2022-07-31 VITALS — BP 101/68 | HR 85 | Temp 98.7°F | Ht 66.0 in | Wt 207.0 lb

## 2022-07-31 DIAGNOSIS — F5089 Other specified eating disorder: Secondary | ICD-10-CM

## 2022-07-31 DIAGNOSIS — E669 Obesity, unspecified: Secondary | ICD-10-CM | POA: Diagnosis not present

## 2022-07-31 DIAGNOSIS — R632 Polyphagia: Secondary | ICD-10-CM

## 2022-07-31 DIAGNOSIS — E559 Vitamin D deficiency, unspecified: Secondary | ICD-10-CM | POA: Diagnosis not present

## 2022-07-31 DIAGNOSIS — Z6833 Body mass index (BMI) 33.0-33.9, adult: Secondary | ICD-10-CM

## 2022-07-31 MED ORDER — VITAMIN D (ERGOCALCIFEROL) 1.25 MG (50000 UNIT) PO CAPS
50000.0000 [IU] | ORAL_CAPSULE | ORAL | 0 refills | Status: AC
Start: 2022-07-31 — End: ?

## 2022-07-31 MED ORDER — QSYMIA 7.5-46 MG PO CP24
ORAL_CAPSULE | ORAL | 0 refills | Status: AC
Start: 2022-07-31 — End: ?

## 2022-07-31 NOTE — Assessment & Plan Note (Signed)
She has seen a rise in appetite off of Saxenda and this has caused more anxiety She has a hx of BED and though she is not binge eating, she reports less control over her eating She is feeling a 'wearing off' from her Adderall in the evening and she is overeating at night  Continue to work on not skipping meals, protein and fiber with meals, allowing 2 healthy snacks per day.  Add in Qsymia 7.5/46 mg qAM.  We discussed MOA and potential adverse Ses.  PDMP reviewed.  Informed consent signed.  Watch for stimulant SE in combination with Adderall.

## 2022-07-31 NOTE — Assessment & Plan Note (Signed)
Hx of binge eating disorder Denies current problems with binge eating but has felt a lot of anxiety around food with coming off of Saxenda Stress levels have been high Seeing Dr Dewaine Conger for CBT Continue to work on stress reduction, self care, proper sleep at night, eating on a schedule, keeping junk food triggers out of the house and counseling.

## 2022-07-31 NOTE — Assessment & Plan Note (Signed)
Last vitamin D Lab Results  Component Value Date   VD25OH 29.7 (L) 05/22/2022   Doing well on RX vitamin D 50,000 IU weekly Energy level is improving.  Recheck vitamin D level in 2 mos

## 2022-07-31 NOTE — Progress Notes (Signed)
Office: [phone]  /  Fax: [phone]  WEIGHT SUMMARY AND BIOMETRICS  Starting Date: 05/22/22  Starting Weight: 210lb   Weight Lost Since Last Visit: 0   Vitals Temp: 98.7 F (37.1 C) BP: 101/68 Pulse Rate: 85 SpO2: 96 %   Body Composition  Body Fat %: 44.5 % Fat Mass (lbs): 92.2 lbs Muscle Mass (lbs): 109 lbs Total Body Water (lbs): 74.6 lbs Visceral Fat Rating : 11     HPI  Chief Complaint: OBESITY  Estoria is here to discuss her progress with her obesity treatment plan. She is on the the Category 2 Plan and states she is following her eating plan approximately 60 % of the time. She states she is exercising 0 minutes 0 times per week.   Interval History:  Since last office visit she is up 1 lb She has traveled and had celebrations She has a net weight loss of 3 lb in the past 2 mos She is moving to the beach in [place] She has been fearful to walk outdoors She is drinking more water Her last Saxenda injection was 5/9 and appetite and cravings have improved Her daughter was visiting and she was eating more carbs   Pharmacotherapy: Topamax 25 mg qHS-- not helping  PHYSICAL EXAM:  Blood pressure 101/68, pulse 85, temperature 98.7 F (37.1 C), height 5\' 6"  (1.676 m), weight 207 lb (93.9 kg), SpO2 96 %. Body mass index is 33.41 kg/m.  General: She is overweight, cooperative, alert, well developed, and in no acute distress. PSYCH: Has normal mood, affect and thought process.   Lungs: Normal breathing effort, no conversational dyspnea.   ASSESSMENT AND PLAN  TREATMENT PLAN FOR OBESITY:  Recommended Dietary Goals  Sreenika is currently in the action stage of change. As such, her goal is to continue weight management plan. She has agreed to the Category 2 Plan.  Behavioral Intervention  We discussed the following Behavioral Modification Strategies today: increasing lean protein intake, decreasing simple carbohydrates , increasing vegetables,  increasing lower glycemic fruits, increasing fiber rich foods, avoiding skipping meals, increasing water intake, emotional eating strategies and understanding the difference between hunger signals and cravings, work on managing stress, creating time for self-care and relaxation measures, continue to practice mindfulness when eating, and planning for success.  Additional resources provided today: NA  Recommended Physical Activity Goals  Shasha has been advised to work up to 150 minutes of moderate intensity aerobic activity a week and strengthening exercises 2-3 times per week for cardiovascular health, weight loss maintenance and preservation of muscle mass.   She has agreed to Start aerobic activity with a goal of 150 minutes a week at moderate intensity.   Pharmacotherapy changes for the treatment of obesity: Qsymia 7.5/46 mg qAM  ASSOCIATED CONDITIONS ADDRESSED TODAY  Polyphagia Assessment & Plan: She has seen a rise in appetite off of Saxenda and this has caused more anxiety She has a hx of BED and though she is not binge eating, she reports less control over her eating She is feeling a 'wearing off' from her Adderall in the evening and she is overeating at night  Continue to work on not skipping meals, protein and fiber with meals, allowing 2 healthy snacks per day.  Add in Qsymia 7.5/46 mg qAM.  We discussed MOA and potential adverse Ses.  PDMP reviewed.  Informed consent signed.  Watch for stimulant SE in combination with Adderall.    Orders: -     Qsymia; 1 capsule po qAM  Dispense: 30 capsule; Refill: 0  Vitamin D deficiency Assessment & Plan: Last vitamin D Lab Results  Component Value Date   VD25OH 29.7 (L) 05/22/2022   Doing well on RX vitamin D 50,000 IU weekly Energy level is improving.  Recheck vitamin D level in 2 mos  Orders: -     Vitamin D (Ergocalciferol); Take 1 capsule (50,000 Units total) by mouth every 7 (seven) days.  Dispense: 4 capsule; Refill:  0  Generalized obesity starting BMI 33.9 05/21/22  BMI 33.0-33.9,adult  Other Specified Feeding or Eating Disorder, Emotional and Binge Eating Behaviors Assessment & Plan: Hx of binge eating disorder Denies current problems with binge eating but has felt a lot of anxiety around food with coming off of Saxenda Stress levels have been high Seeing Dr Dewaine Conger for CBT Continue to work on stress reduction, self care, proper sleep at night, eating on a schedule, keeping junk food triggers out of the house and counseling.       She was informed of the importance of frequent follow up visits to maximize her success with intensive lifestyle modifications for her multiple health conditions.   ATTESTASTION STATEMENTS:  Reviewed by clinician on day of visit: allergies, medications, problem list, medical history, surgical history, family history, social history, and previous encounter notes pertinent to obesity diagnosis.   I have personally spent 30 minutes total time today in preparation, patient care, nutritional counseling and documentation for this visit, including the following: review of clinical lab tests; review of medical tests/procedures/services.      [name], DO DABFM, DABOM [facility] [address] [place], [place] [address] [phone]

## 2022-08-05 ENCOUNTER — Telehealth (INDEPENDENT_AMBULATORY_CARE_PROVIDER_SITE_OTHER): Payer: Self-pay | Admitting: Family Medicine

## 2022-08-05 NOTE — Telephone Encounter (Signed)
Prior authorization done via cover my meds for patients Qsymia. Waiting on determination.  

## 2022-08-06 LAB — CYTOLOGY - PAP
Comment: NEGATIVE
Diagnosis: NEGATIVE
High risk HPV: NEGATIVE

## 2022-08-07 NOTE — Telephone Encounter (Signed)
Prior authorization approved for patients Qsymia. 08/05/2022-11/05/2022.

## 2022-08-14 ENCOUNTER — Ambulatory Visit (INDEPENDENT_AMBULATORY_CARE_PROVIDER_SITE_OTHER): Payer: BC Managed Care – PPO | Admitting: Adult Health

## 2022-08-14 ENCOUNTER — Encounter: Payer: Self-pay | Admitting: Adult Health

## 2022-08-14 DIAGNOSIS — F331 Major depressive disorder, recurrent, moderate: Secondary | ICD-10-CM

## 2022-08-14 DIAGNOSIS — F4310 Post-traumatic stress disorder, unspecified: Secondary | ICD-10-CM | POA: Diagnosis not present

## 2022-08-14 DIAGNOSIS — G4700 Insomnia, unspecified: Secondary | ICD-10-CM

## 2022-08-14 DIAGNOSIS — F411 Generalized anxiety disorder: Secondary | ICD-10-CM

## 2022-08-14 DIAGNOSIS — F902 Attention-deficit hyperactivity disorder, combined type: Secondary | ICD-10-CM

## 2022-08-14 MED ORDER — AMPHETAMINE-DEXTROAMPHET ER 15 MG PO CP24
15.0000 mg | ORAL_CAPSULE | Freq: Every day | ORAL | 0 refills | Status: DC
Start: 2022-08-14 — End: 2022-09-22

## 2022-08-14 MED ORDER — TRAZODONE HCL 100 MG PO TABS
100.0000 mg | ORAL_TABLET | Freq: Every day | ORAL | 1 refills | Status: DC
Start: 2022-08-14 — End: 2023-05-20

## 2022-08-14 MED ORDER — BUPROPION HCL ER (XL) 150 MG PO TB24
ORAL_TABLET | ORAL | 1 refills | Status: DC
Start: 2022-08-14 — End: 2023-03-07

## 2022-08-14 MED ORDER — ESCITALOPRAM OXALATE 20 MG PO TABS: 20.0000 mg | ORAL_TABLET | Freq: Every day | ORAL | 1 refills | Status: DC

## 2022-08-14 MED ORDER — CLONAZEPAM 1 MG PO TABS: ORAL_TABLET | ORAL | 2 refills | Status: DC

## 2022-08-14 NOTE — Progress Notes (Signed)
Monica [name] [id number] [birth year] 52 y.o.  Subjective:   Patient ID:  Monica [name] is a 52 y.o. (DOB 08/21/1970) female.  Chief Complaint: No chief complaint on file.   HPI Monica [name] presents to the office today for follow-up of MDD, GAD, PTSD, ADD, and insomnia.  Describes mood today as "improving". Decreased tearfulness. Mood symptoms - reports decreased depression. Denies anxiety and irritability. Denies panic attacks. Denies worry, rumination, and over thinking. Reports mood is consistent. Stating "I feel like I'm doing ok - taking it day by day". Feels like current medication regimen is helpful. Working with an Building services engineer. Improved interest and motivation. Taking medications as prescribed.  Energy levels vary. Active, does not have a regular exercise routine. Walking daily. Enjoys some usual interests and activities. Single. Lives alone - with dog. Daughter recently graduated from [employer]. Spending time with family and friends.  Appetite adequate. Reports weight stable. Sleep has improved. Averages 6 to 8 hours. Focus and concentration improved with adding Adderall. Completing some tasks. Managing aspects of household. 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 Denies SI or HI.  Denies AH or VH. Denies self harm. Denies substance use.  Previous medication trials: Adderall, Trazadone, Clonazepam, Paxil, Sertraline    Flowsheet Row Admission (Discharged) from 09/03/2020 in [facility] ED from 04/01/2019 in [facility] Emergency Department at [facility]  C-SSRS RISK CATEGORY No Risk High Risk        Review of Systems:  Review of Systems  Musculoskeletal:  Negative for gait problem.  Neurological:  Negative for tremors.  Psychiatric/Behavioral:         Please refer to HPI    Medications: I have reviewed the patient's current medications.  Current Outpatient Medications  Medication Sig Dispense Refill    amphetamine-dextroamphetamine (ADDERALL XR) 15 MG 24 hr capsule Take 1 capsule by mouth daily. 30 capsule 0   brexpiprazole (REXULTI) 1 MG TABS tablet Take 1 tablet (1 mg total) by mouth daily. 30 tablet 5   buPROPion (WELLBUTRIN XL) 150 MG 24 hr tablet TAKE THREE TABLETS EVERY MORNING. 270 tablet 1   cetirizine (ZYRTEC) 10 MG tablet Take 10 mg by mouth daily.     clonazePAM (KLONOPIN) 1 MG tablet TAKE ONE AND 1/2 TABLETS AT BEDTIME AS NEEDED FOR SLEEP. 45 tablet 2   escitalopram (LEXAPRO) 20 MG tablet Take 1 tablet (20 mg total) by mouth daily. 90 tablet 1   fluticasone (FLONASE) 50 MCG/ACT nasal spray Place 1 spray daily into both nostrils.     gabapentin (NEURONTIN) 300 MG capsule Take 1 capsule (300 mg total) by mouth 2 (two) times daily. 60 capsule 2   Insulin Pen Needle (B-D UF III MINI PEN NEEDLES) 31G X 5 MM MISC Use once daily as directed 100 each 0   levonorgestrel (MIRENA) 20 MCG/DAY IUD 1 each by Intrauterine route once.     Phentermine-Topiramate (QSYMIA) 7.5-46 MG CP24 1 capsule po qAM 30 capsule 0   traZODone (DESYREL) 100 MG tablet Take 1-2 tablets (100-200 mg total) by mouth at bedtime. 180 tablet 1   valACYclovir (VALTREX) 500 MG tablet Take 500 mg by mouth 2 (two) times daily as needed (flare up).     Vitamin D, Ergocalciferol, (DRISDOL) 1.25 MG (50000 UNIT) CAPS capsule Take 1 capsule (50,000 Units total) by mouth every 7 (seven) days. 4 capsule 0   No current facility-administered medications for this visit.    Medication Side Effects: None  Allergies:  Allergies  Allergen Reactions   Pristiq [Desvenlafaxine] Other (See Comments)    Not effective   Sertraline Other (See Comments)    Did not feel emotion   Pepto-Bismol [Bismuth Subsalicylate] Rash   Septra [Sulfamethoxazole-Trimethoprim] Rash   Sulfa Antibiotics Rash    Past Medical History:  Diagnosis Date   ADD (attention deficit disorder)    Anxiety    Cholelithiases    symptomatic   Depression     Environmental and seasonal allergies    GAD (generalized anxiety disorder)    Gallbladder disease    Heart murmur    per pt told very faint , no symptoms, told nothing to worry about;  pt had echo done 12-24-2011 in epic ef 55-60% and trace MR   Herpes simplex type 1 infection    History of MRSA infection    left great toe in 2010   History of palpitations    ED visit in epic 05/ 2017;  evaluated by cardiology--- dr Demetrius Charity. Tenny Craw note in epic 08-31-2015 , ST on EKG , no further work-up;  pt had previou echo in epic 12-24-2011 ef 55-60%   History of suicidal behavior 03/2019   [facility] admission for IVC   Insomnia    unspecified   Palpitations    PTSD (post-traumatic stress disorder)    Wears glasses     Past Medical History, Surgical history, Social history, and Family history were reviewed and updated as appropriate.   Please see review of systems for further details on the patient's review from today.   Objective:   Physical Exam:  There were no vitals taken for this visit.  Physical Exam Constitutional:      General: She is not in acute distress. Musculoskeletal:        General: No deformity.  Neurological:     Mental Status: She is alert and oriented to person, place, and time.     Coordination: Coordination normal.  Psychiatric:        Attention and Perception: Attention and perception normal. She does not perceive auditory or visual hallucinations.        Mood and Affect: Mood normal. Mood is not anxious or depressed. Affect is not labile, blunt, angry or inappropriate.        Speech: Speech normal.        Behavior: Behavior normal.        Thought Content: Thought content normal. Thought content is not paranoid or delusional. Thought content does not include homicidal or suicidal ideation. Thought content does not include homicidal or suicidal plan.        Cognition and Memory: Cognition and memory normal.        Judgment: Judgment normal.     Comments: Insight intact      Lab Review:     Component Value Date/Time   NA 140 05/22/2022 0941   K 4.5 05/22/2022 0941   CL 100 05/22/2022 0941   CO2 21 05/22/2022 0941   GLUCOSE 78 05/22/2022 0941   GLUCOSE 108 (H) 04/01/2019 2236   BUN 11 05/22/2022 0941   CREATININE 1.04 (H) 05/22/2022 0941   CALCIUM 9.7 05/22/2022 0941   PROT 7.3 05/22/2022 0941   ALBUMIN 4.5 05/22/2022 0941   AST 16 05/22/2022 0941   ALT 11 05/22/2022 0941   ALKPHOS 87 05/22/2022 0941   BILITOT 0.4 05/22/2022 0941   GFRNONAA >60 04/01/2019 2236   GFRAA >60 04/01/2019 2236       Component Value Date/Time  WBC 7.5 07/03/2022 1431   WBC 8.5 04/01/2019 2236   RBC 4.25 07/03/2022 1431   HGB 12.7 07/03/2022 1431   HCT 37.4 07/03/2022 1431   PLT 495 (H) 07/03/2022 1431   MCV 88.0 07/03/2022 1431   MCH 29.9 07/03/2022 1431   MCHC 34.0 07/03/2022 1431   RDW 13.8 07/03/2022 1431   LYMPHSABS 2.3 07/03/2022 1431   MONOABS 0.5 07/03/2022 1431   EOSABS 0.1 07/03/2022 1431   BASOSABS 0.1 07/03/2022 1431    No results found for: "POCLITH", "LITHIUM"   No results found for: "PHENYTOIN", "PHENOBARB", "VALPROATE", "CBMZ"   .res Assessment: Plan:    Plan:  PDMP reviewed  1. Lexapro 20mg  daily  2. Clonazepam 1.5mg  at hs 3. Trazdone 100mg  - 2 at hs 4. Wellbutrin XL 450mg  daily. Denies seizure history. 5. Rexulti 1 mg daily  6. Gabapentin 300mg  daily to BID 7. Adderall XR 15mg  every morning for ADD symptoms.  Patient feels she is ready to return to the work setting and may return to work without any restrictions effective 08/20/2022.  RTC 4 weeks  Patient advised to contact office with any questions, adverse effects, or acute worsening in signs and symptoms.  Discussed potential benefits, risk, and side effects of benzodiazepines to include potential risk of tolerance and dependence, as well as possible drowsiness.  Advised patient not to drive if experiencing drowsiness and to take lowest possible effective dose to  minimize risk of dependence and tolerance.  Discussed potential metabolic side effects associated with atypical antipsychotics, as well as potential risk for movement side effects. Advised pt to contact office if movement side effects occur.    Diagnoses and all orders for this visit:  Attention deficit hyperactivity disorder (ADHD), combined type -     amphetamine-dextroamphetamine (ADDERALL XR) 15 MG 24 hr capsule; Take 1 capsule by mouth daily.  Major depressive disorder, recurrent episode, moderate (HCC) -     buPROPion (WELLBUTRIN XL) 150 MG 24 hr tablet; TAKE THREE TABLETS EVERY MORNING. -     escitalopram (LEXAPRO) 20 MG tablet; Take 1 tablet (20 mg total) by mouth daily.  Generalized anxiety disorder -     clonazePAM (KLONOPIN) 1 MG tablet; TAKE ONE AND 1/2 TABLETS AT BEDTIME AS NEEDED FOR SLEEP. -     escitalopram (LEXAPRO) 20 MG tablet; Take 1 tablet (20 mg total) by mouth daily.  PTSD (post-traumatic stress disorder) -     escitalopram (LEXAPRO) 20 MG tablet; Take 1 tablet (20 mg total) by mouth daily.  Insomnia, unspecified type -     traZODone (DESYREL) 100 MG tablet; Take 1-2 tablets (100-200 mg total) by mouth at bedtime.     Please see After Visit Summary for patient specific instructions.  Future Appointments  Date Time Provider Department Center  08/18/2022  2:00 PM Cristy Folks, PsyD [facility] None  08/19/2022  5:00 PM [facility] MM 3 [facility]  08/28/2022 11:00 AM Bowen, Scot Jun, DO [facility] None  03/12/2023 11:30 AM [facility] PHLEBOTOMIST [facility] None  03/12/2023 12:00 PM Ladene Artist, MD [facility] None    No orders of the defined types were placed in this encounter.   -------------------------------

## 2022-08-18 ENCOUNTER — Telehealth (INDEPENDENT_AMBULATORY_CARE_PROVIDER_SITE_OTHER): Payer: BC Managed Care – PPO | Admitting: Psychology

## 2022-08-18 DIAGNOSIS — F5089 Other specified eating disorder: Secondary | ICD-10-CM | POA: Diagnosis not present

## 2022-08-18 DIAGNOSIS — F32A Depression, unspecified: Secondary | ICD-10-CM | POA: Diagnosis not present

## 2022-08-18 NOTE — Progress Notes (Signed)
  Office: [phone]  /  Fax: [phone]    Date: August 18, 2022    Appointment Start Time: 2:04pm Duration: 22 minutes Provider: Lawerance Cruel, Psy.D. Type of Session: Individual Therapy  Location of Patient: Home (private location) Location of Provider: Provider's Home (private office) Type of Contact: Telepsychological Visit via MyChart Video Visit  Session Content: Monica [name] is a 52 y.o. female presenting for a follow-up appointment to address the previously established treatment goal of increasing coping skills.Today's appointment was a telepsychological visit. Monica [name] provided verbal consent for today's telepsychological appointment and she is aware she is responsible for securing confidentiality on her end of the session. Prior to proceeding with today's appointment, Monica [name]'s physical location at the time of this appointment was obtained as well a phone number she could be reached at in the event of technical difficulties. Monica [name] and this provider participated in today's telepsychological service.   This provider conducted a brief check-in. Monica [name] stated she is "good" today and [place] adding she is in the process of moving. She acknowledged her "poor eating habits have come back" due to feeling "overwhelmed." Notably, she shared, "I'm not obsessing about my weight." Despite deviations, she also described focusing on protein intake. She was engaged in problem solving to help her eat regularly and congruent to her prescribed structured meal plan. She agreed to implement the following: make hard boiled eggs in bulk; have access to protein shakes; purchase frozen meals congruent to the plan; double recipes; and have easy access to protein snacks. Overall, Monica [name] was receptive to today's appointment as evidenced by openness to sharing, responsiveness to feedback, and willingness to implement discussed strategies .  Mental Status Examination:  Appearance:  neat Behavior: appropriate to circumstances Mood: neutral Affect: mood congruent Speech: WNL Eye Contact: appropriate Psychomotor Activity: WNL Gait: unable to assess Thought Process: linear, logical, and goal directed and denies suicidal, homicidal, and self-harm ideation, plan and intent  Thought Content/Perception: no hallucinations, delusions, bizarre thinking or behavior endorsed or observed Orientation: AAOx4 Memory/Concentration: intact Insight: fair Judgment: fair  Interventions:  Conducted a brief chart review Conducted a risk assessment Provided empathic reflections and validation Provided positive reinforcement Employed supportive psychotherapy interventions to facilitate reduced distress and to improve coping skills with identified stressors Engaged patient in problem solving  DSM-5 Diagnosis(es):  F50.89 Other Specified Feeding or Eating Disorder, Emotional and Binge Eating Behaviors and  F32.A Unspecified Depressive Disorder  Treatment Goal & Progress: During the initial appointment with this provider, the following treatment goal was established: increase coping skills. Monica [name] has demonstrated progress in her goal as evidenced by increased awareness of hunger patterns, increased awareness of triggers for emotional eating behaviors, reduction in emotional eating behaviors , and reduction in binge eating behaviors. Monica [name] also continues to demonstrate willingness to engage in learned skill(s).  Plan: The next appointment is scheduled for 09/09/2022 at 2pm, which will be via MyChart Video Visit. The next session will focus on working towards the established treatment goal. Monica [name] stated she will continue psychiatric services with [facility], noting a plan to establish care for therapeutic services after she moves.

## 2022-08-19 ENCOUNTER — Ambulatory Visit: Payer: BC Managed Care – PPO

## 2022-08-28 ENCOUNTER — Other Ambulatory Visit (INDEPENDENT_AMBULATORY_CARE_PROVIDER_SITE_OTHER): Payer: Self-pay | Admitting: Family Medicine

## 2022-08-28 ENCOUNTER — Ambulatory Visit (INDEPENDENT_AMBULATORY_CARE_PROVIDER_SITE_OTHER): Payer: BC Managed Care – PPO | Admitting: Family Medicine

## 2022-08-28 DIAGNOSIS — E559 Vitamin D deficiency, unspecified: Secondary | ICD-10-CM

## 2022-09-09 ENCOUNTER — Telehealth (INDEPENDENT_AMBULATORY_CARE_PROVIDER_SITE_OTHER): Payer: BC Managed Care – PPO | Admitting: Psychology

## 2022-09-16 ENCOUNTER — Ambulatory Visit
Admission: RE | Admit: 2022-09-16 | Discharge: 2022-09-16 | Disposition: A | Discharge status: Home / Self Care | Payer: BC Managed Care – PPO | Source: Ambulatory Visit | Attending: Nurse Practitioner | Admitting: Nurse Practitioner

## 2022-09-16 DIAGNOSIS — Z0000 Encounter for general adult medical examination without abnormal findings: Secondary | ICD-10-CM

## 2022-09-22 ENCOUNTER — Other Ambulatory Visit: Payer: Self-pay

## 2022-09-22 ENCOUNTER — Telehealth: Payer: Self-pay | Admitting: Adult Health

## 2022-09-22 DIAGNOSIS — F902 Attention-deficit hyperactivity disorder, combined type: Secondary | ICD-10-CM

## 2022-09-22 MED ORDER — AMPHETAMINE-DEXTROAMPHET ER 15 MG PO CP24: 15.0000 mg | ORAL_CAPSULE | Freq: Every day | ORAL | 0 refills | Status: DC

## 2022-09-22 NOTE — Telephone Encounter (Signed)
 Pended.

## 2022-09-22 NOTE — Telephone Encounter (Signed)
Monica [name] called at 11:35 to request refill of her Adderall ER.  No appt.  Due back 11/2022.  She is in the process of moving and will call to schedule when she gets moved.  Send refill to [id number] - [place], [place] - [address]

## 2022-10-22 ENCOUNTER — Other Ambulatory Visit: Payer: Self-pay

## 2022-10-22 ENCOUNTER — Telehealth: Payer: Self-pay | Admitting: Adult Health

## 2022-10-22 DIAGNOSIS — F902 Attention-deficit hyperactivity disorder, combined type: Secondary | ICD-10-CM

## 2022-10-22 MED ORDER — AMPHETAMINE-DEXTROAMPHET ER 15 MG PO CP24
15.0000 mg | ORAL_CAPSULE | Freq: Every day | ORAL | 0 refills | Status: DC
Start: 2022-10-22 — End: 2022-10-31

## 2022-10-22 NOTE — Telephone Encounter (Signed)
Monica [name] called at 9:50 to request refill of her Adderall Xr 15mg .  Appt 10/29/22.  [facility] to [facility] at [address], [place], [place] [address]

## 2022-10-22 NOTE — Telephone Encounter (Signed)
Pended.

## 2022-10-24 ENCOUNTER — Other Ambulatory Visit: Payer: Self-pay

## 2022-10-24 DIAGNOSIS — F411 Generalized anxiety disorder: Secondary | ICD-10-CM

## 2022-10-24 DIAGNOSIS — F4310 Post-traumatic stress disorder, unspecified: Secondary | ICD-10-CM

## 2022-10-24 DIAGNOSIS — F331 Major depressive disorder, recurrent, moderate: Secondary | ICD-10-CM

## 2022-10-24 MED ORDER — ESCITALOPRAM OXALATE 20 MG PO TABS
20.0000 mg | ORAL_TABLET | Freq: Every day | ORAL | 1 refills | Status: DC
Start: 2022-10-24 — End: 2022-11-14

## 2022-10-24 MED ORDER — BREXPIPRAZOLE 1 MG PO TABS: 1.0000 mg | ORAL_TABLET | Freq: Every day | ORAL | 5 refills | Status: DC

## 2022-10-29 ENCOUNTER — Telehealth: Payer: BC Managed Care – PPO | Admitting: Adult Health

## 2022-10-31 ENCOUNTER — Telehealth (INDEPENDENT_AMBULATORY_CARE_PROVIDER_SITE_OTHER): Payer: BC Managed Care – PPO | Admitting: Adult Health

## 2022-10-31 ENCOUNTER — Encounter: Payer: Self-pay | Admitting: Adult Health

## 2022-10-31 DIAGNOSIS — F902 Attention-deficit hyperactivity disorder, combined type: Secondary | ICD-10-CM | POA: Diagnosis not present

## 2022-10-31 DIAGNOSIS — F411 Generalized anxiety disorder: Secondary | ICD-10-CM | POA: Diagnosis not present

## 2022-10-31 DIAGNOSIS — F4310 Post-traumatic stress disorder, unspecified: Secondary | ICD-10-CM | POA: Diagnosis not present

## 2022-10-31 DIAGNOSIS — F331 Major depressive disorder, recurrent, moderate: Secondary | ICD-10-CM

## 2022-10-31 DIAGNOSIS — G4700 Insomnia, unspecified: Secondary | ICD-10-CM

## 2022-10-31 MED ORDER — CLONAZEPAM 1 MG PO TABS
ORAL_TABLET | ORAL | 2 refills | Status: DC
Start: 2022-10-31 — End: 2023-01-12

## 2022-10-31 MED ORDER — AMPHETAMINE-DEXTROAMPHET ER 15 MG PO CP24
15.0000 mg | ORAL_CAPSULE | Freq: Two times a day (BID) | ORAL | 0 refills | Status: DC
Start: 2022-10-31 — End: 2022-12-04

## 2022-10-31 NOTE — Progress Notes (Signed)
Monica [name] [id number] 08/12/1970 52 y.o.  Virtual Visit via Video Note  I connected with pt @ on 10/31/22 at  4:00 PM EDT by a video enabled telemedicine application and verified that I am speaking with the correct person using two identifiers.   I discussed the limitations of evaluation and management by telemedicine and the availability of in person appointments. The patient expressed understanding and agreed to proceed.  I discussed the assessment and treatment plan with the patient. The patient was provided an opportunity to ask questions and all were answered. The patient agreed with the plan and demonstrated an understanding of the instructions.   The patient was advised to call back or seek an in-person evaluation if the symptoms worsen or if the condition fails to improve as anticipated.  I provided 25 minutes of non-face-to-face time during this encounter.  The patient was located at home.  The provider was located at [facility].   Monica Gibbs, NP   Subjective:   Patient ID:  Monica [name] is a 52 y.o. (DOB Sep 01, 1970) female.  Chief Complaint: No chief complaint on file.   HPI VIAN DENARO presents to the office today for follow-up of MDD, GAD, PTSD, ADD, and insomnia.  Describes mood today as "ok". Decreased tearfulness. Mood symptoms - denies depression, anxiety and irritability. Denies panic attacks. Denies worry, rumination, and over thinking. Reports mood is consistent. Stating "I feel like I'm doing ok". Feels like current medication regimen is helpful. Would like to increase Adderall dosage with returning to work setting. Improved interest and motivation. Taking medications as prescribed.  Energy levels getting better. Active, does not have a regular exercise routine. Walking. Enjoys some usual interests and activities. Single. Lives alone - with dog. Daughter recently graduated from [employer]. Spending time with family and friends.   Appetite adequate. Reports weight stable. Sleep has improved. Averages 6 to 8 hours. Focus and concentration improved with adding Adderall. Completing some tasks. Managing aspects of household. 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 Denies SI or HI.  Denies AH or VH. Denies self harm. Denies substance use.  Previous medication trials: Adderall, Trazadone, Clonazepam, Paxil, Sertraline    Flowsheet Row Admission (Discharged) from 09/03/2020 in [facility] ED from 04/01/2019 in [facility] Emergency Department at [facility]  C-SSRS RISK CATEGORY No Risk High Risk        Review of Systems:  Review of Systems  Musculoskeletal:  Negative for gait problem.  Neurological:  Negative for tremors.  Psychiatric/Behavioral:         Please refer to HPI    Medications: I have reviewed the patient's current medications.  Current Outpatient Medications  Medication Sig Dispense Refill   amphetamine-dextroamphetamine (ADDERALL XR) 15 MG 24 hr capsule Take 1 capsule by mouth 2 (two) times daily. 60 capsule 0   brexpiprazole (REXULTI) 1 MG TABS tablet Take 1 tablet (1 mg total) by mouth daily. 30 tablet 5   buPROPion (WELLBUTRIN XL) 150 MG 24 hr tablet TAKE THREE TABLETS EVERY MORNING. 270 tablet 1   cetirizine (ZYRTEC) 10 MG tablet Take 10 mg by mouth daily.     clonazePAM (KLONOPIN) 1 MG tablet TAKE ONE AND 1/2 TABLETS AT BEDTIME AS NEEDED FOR SLEEP. 45 tablet 2   escitalopram (LEXAPRO) 20 MG tablet Take 1 tablet (20 mg total) by mouth daily. 90 tablet 1   fluticasone (FLONASE) 50 MCG/ACT nasal spray Place 1 spray daily into both nostrils.  gabapentin (NEURONTIN) 300 MG capsule Take 1 capsule (300 mg total) by mouth 2 (two) times daily. 60 capsule 2   Insulin Pen Needle (B-D UF III MINI PEN NEEDLES) 31G X 5 MM MISC Use once daily as directed 100 each 0   levonorgestrel (MIRENA) 20 MCG/DAY IUD 1 each by Intrauterine route once.     Phentermine-Topiramate  (QSYMIA) 7.5-46 MG CP24 1 capsule po qAM 30 capsule 0   traZODone (DESYREL) 100 MG tablet Take 1-2 tablets (100-200 mg total) by mouth at bedtime. 180 tablet 1   valACYclovir (VALTREX) 500 MG tablet Take 500 mg by mouth 2 (two) times daily as needed (flare up).     Vitamin D, Ergocalciferol, (DRISDOL) 1.25 MG (50000 UNIT) CAPS capsule Take 1 capsule (50,000 Units total) by mouth every 7 (seven) days. 4 capsule 0   No current facility-administered medications for this visit.    Medication Side Effects: None  Allergies:  Allergies  Allergen Reactions   Pristiq [Desvenlafaxine] Other (See Comments)    Not effective   Sertraline Other (See Comments)    Did not feel emotion   Pepto-Bismol [Bismuth Subsalicylate] Rash   Septra [Sulfamethoxazole-Trimethoprim] Rash   Sulfa Antibiotics Rash    Past Medical History:  Diagnosis Date   ADD (attention deficit disorder)    Anxiety    Cholelithiases    symptomatic   Depression    Environmental and seasonal allergies    GAD (generalized anxiety disorder)    Gallbladder disease    Heart murmur    per pt told very faint , no symptoms, told nothing to worry about;  pt had echo done 12-24-2011 in epic ef 55-60% and trace MR   Herpes simplex type 1 infection    History of MRSA infection    left great toe in 2010   History of palpitations    ED visit in epic 05/ 2017;  evaluated by cardiology--- dr Demetrius Charity. Tenny Craw note in epic 08-31-2015 , ST on EKG , no further work-up;  pt had previou echo in epic 12-24-2011 ef 55-60%   History of suicidal behavior 03/2019   [facility] admission for IVC   Insomnia    unspecified   Palpitations    PTSD (post-traumatic stress disorder)    Wears glasses     Past Medical History, Surgical history, Social history, and Family history were reviewed and updated as appropriate.   Please see review of systems for further details on the patient's review from today.   Objective:   Physical Exam:  There were no vitals taken  for this visit.  Physical Exam Constitutional:      General: She is not in acute distress. Musculoskeletal:        General: No deformity.  Neurological:     Mental Status: She is alert and oriented to person, place, and time.     Coordination: Coordination normal.  Psychiatric:        Attention and Perception: Attention and perception normal. She does not perceive auditory or visual hallucinations.        Mood and Affect: Mood normal. Mood is not anxious or depressed. Affect is not labile, blunt, angry or inappropriate.        Speech: Speech normal.        Behavior: Behavior normal.        Thought Content: Thought content normal. Thought content is not paranoid or delusional. Thought content does not include homicidal or suicidal ideation. Thought content does not include homicidal or  suicidal plan.        Cognition and Memory: Cognition and memory normal.        Judgment: Judgment normal.     Comments: Insight intact     Lab Review:     Component Value Date/Time   NA 140 05/22/2022 0941   K 4.5 05/22/2022 0941   CL 100 05/22/2022 0941   CO2 21 05/22/2022 0941   GLUCOSE 78 05/22/2022 0941   GLUCOSE 108 (H) 04/01/2019 2236   BUN 11 05/22/2022 0941   CREATININE 1.04 (H) 05/22/2022 0941   CALCIUM 9.7 05/22/2022 0941   PROT 7.3 05/22/2022 0941   ALBUMIN 4.5 05/22/2022 0941   AST 16 05/22/2022 0941   ALT 11 05/22/2022 0941   ALKPHOS 87 05/22/2022 0941   BILITOT 0.4 05/22/2022 0941   GFRNONAA >60 04/01/2019 2236   GFRAA >60 04/01/2019 2236       Component Value Date/Time   WBC 7.5 07/03/2022 1431   WBC 8.5 04/01/2019 2236   RBC 4.25 07/03/2022 1431   HGB 12.7 07/03/2022 1431   HCT 37.4 07/03/2022 1431   PLT 495 (H) 07/03/2022 1431   MCV 88.0 07/03/2022 1431   MCH 29.9 07/03/2022 1431   MCHC 34.0 07/03/2022 1431   RDW 13.8 07/03/2022 1431   LYMPHSABS 2.3 07/03/2022 1431   MONOABS 0.5 07/03/2022 1431   EOSABS 0.1 07/03/2022 1431   BASOSABS 0.1 07/03/2022 1431     No results found for: "POCLITH", "LITHIUM"   No results found for: "PHENYTOIN", "PHENOBARB", "VALPROATE", "CBMZ"   .res Assessment: Plan:    Plan:  PDMP reviewed  1. Lexapro 20mg  daily  2. Clonazepam 1.5mg  at hs 3. Trazdone 100mg  - 2 at hs 4. Wellbutrin XL 450mg  daily. Denies seizure history. 5. Rexulti 1 mg daily  6. Gabapentin 300mg  daily to BID - not taking currently 7. Increase Adderall XR 15mg  every morning to BD for ADD symptoms.  RTC 4 weeks  Patient advised to contact office with any questions, adverse effects, or acute worsening in signs and symptoms.  Discussed potential benefits, risk, and side effects of benzodiazepines to include potential risk of tolerance and dependence, as well as possible drowsiness.  Advised patient not to drive if experiencing drowsiness and to take lowest possible effective dose to minimize risk of dependence and tolerance.  Discussed potential metabolic side effects associated with atypical antipsychotics, as well as potential risk for movement side effects. Advised pt to contact office if movement side effects occur.    Diagnoses and all orders for this visit:  Major depressive disorder, recurrent episode, moderate (HCC)  Attention deficit hyperactivity disorder (ADHD), combined type -     amphetamine-dextroamphetamine (ADDERALL XR) 15 MG 24 hr capsule; Take 1 capsule by mouth 2 (two) times daily.  Generalized anxiety disorder -     clonazePAM (KLONOPIN) 1 MG tablet; TAKE ONE AND 1/2 TABLETS AT BEDTIME AS NEEDED FOR SLEEP.  PTSD (post-traumatic stress disorder)  Insomnia, unspecified type     Please see After Visit Summary for patient specific instructions.  Future Appointments  Date Time Provider Department Center  03/12/2023 11:30 AM [facility] PHLEBOTOMIST [facility] None  03/12/2023 12:00 PM Ladene Artist, MD [facility] None    No orders of the defined types were placed in this  encounter.   -------------------------------

## 2022-11-14 ENCOUNTER — Other Ambulatory Visit: Payer: Self-pay | Admitting: Adult Health

## 2022-11-14 DIAGNOSIS — F411 Generalized anxiety disorder: Secondary | ICD-10-CM

## 2022-11-14 DIAGNOSIS — F331 Major depressive disorder, recurrent, moderate: Secondary | ICD-10-CM

## 2022-11-14 DIAGNOSIS — F4310 Post-traumatic stress disorder, unspecified: Secondary | ICD-10-CM

## 2022-12-04 ENCOUNTER — Telehealth: Payer: BC Managed Care – PPO | Admitting: Adult Health

## 2022-12-04 ENCOUNTER — Encounter: Payer: Self-pay | Admitting: Adult Health

## 2022-12-04 DIAGNOSIS — F902 Attention-deficit hyperactivity disorder, combined type: Secondary | ICD-10-CM

## 2022-12-04 DIAGNOSIS — G4700 Insomnia, unspecified: Secondary | ICD-10-CM

## 2022-12-04 DIAGNOSIS — F411 Generalized anxiety disorder: Secondary | ICD-10-CM

## 2022-12-04 DIAGNOSIS — F4310 Post-traumatic stress disorder, unspecified: Secondary | ICD-10-CM | POA: Diagnosis not present

## 2022-12-04 DIAGNOSIS — F331 Major depressive disorder, recurrent, moderate: Secondary | ICD-10-CM | POA: Diagnosis not present

## 2022-12-04 MED ORDER — AMPHETAMINE-DEXTROAMPHETAMINE 10 MG PO TABS
10.0000 mg | ORAL_TABLET | Freq: Every day | ORAL | 0 refills | Status: DC
Start: 2022-12-04 — End: 2023-01-12

## 2022-12-04 MED ORDER — AMPHETAMINE-DEXTROAMPHET ER 30 MG PO CP24
30.0000 mg | ORAL_CAPSULE | Freq: Every day | ORAL | 0 refills | Status: DC
Start: 2022-12-04 — End: 2023-01-12

## 2022-12-14 ENCOUNTER — Telehealth: Payer: Self-pay

## 2022-12-14 NOTE — Telephone Encounter (Signed)
Prior Authorization Amphetamine-dextroamphetamine 10 and XR30 #30/30 [employer]  Approved both Effective:  12/08/22-12/07/25

## 2023-01-12 ENCOUNTER — Encounter: Payer: Self-pay | Admitting: Adult Health

## 2023-01-12 ENCOUNTER — Telehealth: Payer: BC Managed Care – PPO | Admitting: Adult Health

## 2023-01-12 DIAGNOSIS — F331 Major depressive disorder, recurrent, moderate: Secondary | ICD-10-CM | POA: Diagnosis not present

## 2023-01-12 DIAGNOSIS — F411 Generalized anxiety disorder: Secondary | ICD-10-CM

## 2023-01-12 DIAGNOSIS — F902 Attention-deficit hyperactivity disorder, combined type: Secondary | ICD-10-CM | POA: Diagnosis not present

## 2023-01-12 DIAGNOSIS — F4310 Post-traumatic stress disorder, unspecified: Secondary | ICD-10-CM | POA: Diagnosis not present

## 2023-01-12 DIAGNOSIS — G4700 Insomnia, unspecified: Secondary | ICD-10-CM

## 2023-01-12 MED ORDER — CLONAZEPAM 1 MG PO TABS: ORAL_TABLET | ORAL | 2 refills | Status: DC

## 2023-01-12 MED ORDER — AMPHETAMINE-DEXTROAMPHETAMINE 10 MG PO TABS: 10.0000 mg | ORAL_TABLET | Freq: Every day | ORAL | 0 refills | Status: DC

## 2023-01-12 MED ORDER — AMPHETAMINE-DEXTROAMPHET ER 30 MG PO CP24
30.0000 mg | ORAL_CAPSULE | Freq: Every day | ORAL | 0 refills | Status: DC
Start: 2023-02-09 — End: 2023-04-14

## 2023-01-12 MED ORDER — AMPHETAMINE-DEXTROAMPHET ER 30 MG PO CP24: 30.0000 mg | ORAL_CAPSULE | Freq: Every day | ORAL | 0 refills | Status: DC

## 2023-01-12 MED ORDER — AMPHETAMINE-DEXTROAMPHETAMINE 10 MG PO TABS
10.0000 mg | ORAL_TABLET | Freq: Every day | ORAL | 0 refills | Status: DC
Start: 2023-02-09 — End: 2023-04-14

## 2023-01-12 NOTE — Progress Notes (Signed)
Monica [name] [id number] February 06, 1971 52 y.o.  Virtual Visit via Video Note  I connected with pt @ on 01/12/23 at  5:00 PM EST by a video enabled telemedicine application and verified that I am speaking with the correct person using two identifiers.   I discussed the limitations of evaluation and management by telemedicine and the availability of in person appointments. The patient expressed understanding and agreed to proceed.  I discussed the assessment and treatment plan with the patient. The patient was provided an opportunity to ask questions and all were answered. The patient agreed with the plan and demonstrated an understanding of the instructions.   The patient was advised to call back or seek an in-person evaluation if the symptoms worsen or if the condition fails to improve as anticipated.  I provided 25 minutes of non-face-to-face time during this encounter.  The patient was located at home.  The provider was located at [facility].   Dorothyann Gibbs, NP   Subjective:   Patient ID:  Monica [name] is a 52 y.o. (DOB May 01, 1970) female.  Chief Complaint: No chief complaint on file.   HPI Monica [name] presents for follow-up of MDD, GAD, PTSD, ADD, and insomnia.  Describes mood today as "ok". Decreased tearfulness. Mood symptoms - denies depression, anxiety and irritability. Denies panic attacks. Denies worry, rumination, and over thinking. Reports mood is consistent. Stating "I feel like I'm doing alright". Feels like current medication regimen is helpful. Stable interest and motivation. Taking medications as prescribed.  Energy levels getting better. Active, does not have a regular exercise routine. Walking. Enjoys some usual interests and activities. Single. Lives alone - with dog. Daughter recently graduated from [employer] - living in [place]. Spending time with family and friends.  Appetite adequate. Reports weight stable. Sleep has improved. Averages  6 to 8 hours. Focus and concentration improved. Completing some tasks. Managing aspects of household. 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  Denies AH or VH. Denies self harm. Denies substance use.  Previous medication trials: Adderall, Trazadone, Clonazepam, Paxil, Sertraline   Review of Systems:  Review of Systems  Musculoskeletal:  Negative for gait problem.  Neurological:  Negative for tremors.  Psychiatric/Behavioral:         Please refer to HPI    Medications: I have reviewed the patient's current medications.  Current Outpatient Medications  Medication Sig Dispense Refill   [START ON 02/09/2023] amphetamine-dextroamphetamine (ADDERALL XR) 30 MG 24 hr capsule Take 1 capsule (30 mg total) by mouth daily. 30 capsule 0   [START ON 02/09/2023] amphetamine-dextroamphetamine (ADDERALL) 10 MG tablet Take 1 tablet (10 mg total) by mouth daily. 30 tablet 0   amphetamine-dextroamphetamine (ADDERALL XR) 30 MG 24 hr capsule Take 1 capsule (30 mg total) by mouth daily. 30 capsule 0   amphetamine-dextroamphetamine (ADDERALL) 10 MG tablet Take 1 tablet (10 mg total) by mouth daily. 30 tablet 0   brexpiprazole (REXULTI) 1 MG TABS tablet Take 1 tablet (1 mg total) by mouth daily. 30 tablet 5   buPROPion (WELLBUTRIN XL) 150 MG 24 hr tablet TAKE THREE TABLETS EVERY MORNING. 270 tablet 1   cetirizine (ZYRTEC) 10 MG tablet Take 10 mg by mouth daily.     clonazePAM (KLONOPIN) 1 MG tablet TAKE ONE AND 1/2 TABLETS AT BEDTIME AS NEEDED FOR SLEEP. 45 tablet 2   escitalopram (LEXAPRO) 20 MG tablet TAKE 1 TABLET BY MOUTH EVERY DAY 90 tablet 1   fluticasone (FLONASE) 50 MCG/ACT nasal spray  Place 1 spray daily into both nostrils.     gabapentin (NEURONTIN) 300 MG capsule Take 1 capsule (300 mg total) by mouth 2 (two) times daily. 60 capsule 2   Insulin Pen Needle (B-D UF III MINI PEN NEEDLES) 31G X 5 MM MISC Use once daily as directed 100 each 0   levonorgestrel (MIRENA) 20 MCG/DAY IUD 1 each by  Intrauterine route once.     Phentermine-Topiramate (QSYMIA) 7.5-46 MG CP24 1 capsule po qAM 30 capsule 0   traZODone (DESYREL) 100 MG tablet Take 1-2 tablets (100-200 mg total) by mouth at bedtime. 180 tablet 1   valACYclovir (VALTREX) 500 MG tablet Take 500 mg by mouth 2 (two) times daily as needed (flare up).     Vitamin D, Ergocalciferol, (DRISDOL) 1.25 MG (50000 UNIT) CAPS capsule Take 1 capsule (50,000 Units total) by mouth every 7 (seven) days. 4 capsule 0   No current facility-administered medications for this visit.    Medication Side Effects: None  Allergies:  Allergies  Allergen Reactions   Pristiq [Desvenlafaxine] Other (See Comments)    Not effective   Sertraline Other (See Comments)    Did not feel emotion   Pepto-Bismol [Bismuth Subsalicylate] Rash   Septra [Sulfamethoxazole-Trimethoprim] Rash   Sulfa Antibiotics Rash    Past Medical History:  Diagnosis Date   ADD (attention deficit disorder)    Anxiety    Cholelithiases    symptomatic   Depression    Environmental and seasonal allergies    GAD (generalized anxiety disorder)    Gallbladder disease    Heart murmur    per pt told very faint , no symptoms, told nothing to worry about;  pt had echo done 12-24-2011 in epic ef 55-60% and trace MR   Herpes simplex type 1 infection    History of MRSA infection    left great toe in 2010   History of palpitations    ED visit in epic 05/ 2017;  evaluated by cardiology--- dr Demetrius Charity. Tenny Craw note in epic 08-31-2015 , ST on EKG , no further work-up;  pt had previou echo in epic 12-24-2011 ef 55-60%   History of suicidal behavior 03/2019   [facility] admission for IVC   Insomnia    unspecified   Palpitations    PTSD (post-traumatic stress disorder)    Wears glasses     Family History  Problem Relation Age of Onset   Bipolar disorder Mother    Depression Mother    Anxiety disorder Mother    Cancer Father    Lung cancer Father    Migraines Neg Hx     Social History    Socioeconomic History   Marital status: Divorced    Spouse name: Not on file   Number of children: 1   Years of education: Not on file   Highest education level: Bachelor's degree (e.g., BA, AB, BS)  Occupational History   Not on file  Tobacco Use   Smoking status: Former    Current packs/day: 0.00    Types: Cigarettes    Start date: 45    Quit date: 2002    Years since quitting: 22.8   Smokeless tobacco: Never  Vaping Use   Vaping status: Never Used  Substance and Sexual Activity   Alcohol use: Not Currently    Comment: occasional   Drug use: Never   Sexual activity: Not on file  Other Topics Concern   Not on file  Social History Narrative   Lives at  home with fiance   Right handed   2 cups of caffeine daily   Social Determinants of Health   Financial Resource Strain: Not on file  Food Insecurity: Not on file  Transportation Needs: Not on file  Physical Activity: Not on file  Stress: Not on file  Social Connections: Not on file  Intimate Partner Violence: Not on file    Past Medical History, Surgical history, Social history, and Family history were reviewed and updated as appropriate.   Please see review of systems for further details on the patient's review from today.   Objective:   Physical Exam:  There were no vitals taken for this visit.  Physical Exam Constitutional:      General: She is not in acute distress. Musculoskeletal:        General: No deformity.  Neurological:     Mental Status: She is alert and oriented to person, place, and time.     Coordination: Coordination normal.  Psychiatric:        Attention and Perception: Attention and perception normal. She does not perceive auditory or visual hallucinations.        Mood and Affect: Mood normal. Mood is not anxious or depressed. Affect is not labile, blunt, angry or inappropriate.        Speech: Speech normal.        Behavior: Behavior normal.        Thought Content: Thought content  normal. Thought content is not paranoid or delusional. Thought content does not include homicidal or suicidal ideation. Thought content does not include homicidal or suicidal plan.        Cognition and Memory: Cognition and memory normal.        Judgment: Judgment normal.     Comments: Insight intact     Lab Review:     Component Value Date/Time   NA 140 05/22/2022 0941   K 4.5 05/22/2022 0941   CL 100 05/22/2022 0941   CO2 21 05/22/2022 0941   GLUCOSE 78 05/22/2022 0941   GLUCOSE 108 (H) 04/01/2019 2236   BUN 11 05/22/2022 0941   CREATININE 1.04 (H) 05/22/2022 0941   CALCIUM 9.7 05/22/2022 0941   PROT 7.3 05/22/2022 0941   ALBUMIN 4.5 05/22/2022 0941   AST 16 05/22/2022 0941   ALT 11 05/22/2022 0941   ALKPHOS 87 05/22/2022 0941   BILITOT 0.4 05/22/2022 0941   GFRNONAA >60 04/01/2019 2236   GFRAA >60 04/01/2019 2236       Component Value Date/Time   WBC 7.5 07/03/2022 1431   WBC 8.5 04/01/2019 2236   RBC 4.25 07/03/2022 1431   HGB 12.7 07/03/2022 1431   HCT 37.4 07/03/2022 1431   PLT 495 (H) 07/03/2022 1431   MCV 88.0 07/03/2022 1431   MCH 29.9 07/03/2022 1431   MCHC 34.0 07/03/2022 1431   RDW 13.8 07/03/2022 1431   LYMPHSABS 2.3 07/03/2022 1431   MONOABS 0.5 07/03/2022 1431   EOSABS 0.1 07/03/2022 1431   BASOSABS 0.1 07/03/2022 1431    No results found for: "POCLITH", "LITHIUM"   No results found for: "PHENYTOIN", "PHENOBARB", "VALPROATE", "CBMZ"   .res Assessment: Plan:    Plan:  PDMP reviewed  Lexapro 20mg  daily  Clonazepam 1.5mg  at hs Trazdone 100mg  - 2 at hs Wellbutrin XL 450mg  daily. Denies seizure history. Rexulti 1 mg daily  Gabapentin 300mg  daily to BID - not taking currently  Adderall XR 30mg  every morning ADD symptoms. Adderall 10mg  daily  RTC 4 weeks  Patient advised to contact office with any questions, adverse effects, or acute worsening in signs and symptoms.  Discussed potential benefits, risk, and side effects of benzodiazepines  to include potential risk of tolerance and dependence, as well as possible drowsiness.  Advised patient not to drive if experiencing drowsiness and to take lowest possible effective dose to minimize risk of dependence and tolerance.  Discussed potential metabolic side effects associated with atypical antipsychotics, as well as potential risk for movement side effects. Advised pt to contact office if movement side effects occur.   Diagnoses and all orders for this visit:  Major depressive disorder, recurrent episode, moderate (HCC)  Generalized anxiety disorder -     clonazePAM (KLONOPIN) 1 MG tablet; TAKE ONE AND 1/2 TABLETS AT BEDTIME AS NEEDED FOR SLEEP.  Attention deficit hyperactivity disorder (ADHD), combined type -     amphetamine-dextroamphetamine (ADDERALL XR) 30 MG 24 hr capsule; Take 1 capsule (30 mg total) by mouth daily. -     amphetamine-dextroamphetamine (ADDERALL) 10 MG tablet; Take 1 tablet (10 mg total) by mouth daily. -     amphetamine-dextroamphetamine (ADDERALL) 10 MG tablet; Take 1 tablet (10 mg total) by mouth daily. -     amphetamine-dextroamphetamine (ADDERALL XR) 30 MG 24 hr capsule; Take 1 capsule (30 mg total) by mouth daily.  PTSD (post-traumatic stress disorder)  Insomnia, unspecified type     Please see After Visit Summary for patient specific instructions.  Future Appointments  Date Time Provider Department Center  03/12/2023 11:30 AM [facility] PHLEBOTOMIST [facility] None  03/12/2023 12:00 PM Ladene Artist, MD [facility] None    No orders of the defined types were placed in this encounter.     -------------------------------

## 2023-01-21 ENCOUNTER — Encounter: Payer: Self-pay | Admitting: Psychiatry

## 2023-02-16 ENCOUNTER — Telehealth: Payer: Self-pay | Admitting: *Deleted

## 2023-02-16 DIAGNOSIS — D75839 Thrombocytosis, unspecified: Secondary | ICD-10-CM

## 2023-02-16 NOTE — Telephone Encounter (Signed)
Patient requesting referral to [facility] to transfer care. Referral order placed and faxed w/demographics and medical records.  Sent to [phone].

## 2023-03-07 ENCOUNTER — Other Ambulatory Visit: Payer: Self-pay | Admitting: Adult Health

## 2023-03-07 DIAGNOSIS — F331 Major depressive disorder, recurrent, moderate: Secondary | ICD-10-CM

## 2023-03-07 NOTE — Telephone Encounter (Signed)
Sent MyChart message to schedule FU.

## 2023-03-12 ENCOUNTER — Other Ambulatory Visit: Payer: Self-pay

## 2023-03-12 ENCOUNTER — Other Ambulatory Visit: Payer: BC Managed Care – PPO

## 2023-03-12 ENCOUNTER — Ambulatory Visit: Payer: BC Managed Care – PPO | Admitting: Oncology

## 2023-03-16 ENCOUNTER — Telehealth: Payer: Self-pay | Admitting: Adult Health

## 2023-03-16 ENCOUNTER — Other Ambulatory Visit: Payer: Self-pay

## 2023-03-16 DIAGNOSIS — F902 Attention-deficit hyperactivity disorder, combined type: Secondary | ICD-10-CM

## 2023-03-16 MED ORDER — AMPHETAMINE-DEXTROAMPHET ER 30 MG PO CP24: 30.0000 mg | ORAL_CAPSULE | Freq: Every day | ORAL | 0 refills | Status: DC

## 2023-03-16 MED ORDER — AMPHETAMINE-DEXTROAMPHETAMINE 10 MG PO TABS: 10.0000 mg | ORAL_TABLET | Freq: Every day | ORAL | 0 refills | Status: DC

## 2023-03-16 NOTE — Telephone Encounter (Signed)
 Pt lvm that she has an appt on Thursday. She needs a refill on her adderall xr 30 mg and adderall 10 mg. Pharmacy is [facility] in [place]

## 2023-03-16 NOTE — Telephone Encounter (Signed)
 Pended adderall 30 and 10 mg to rqst pharm.

## 2023-03-19 ENCOUNTER — Encounter: Payer: Self-pay | Admitting: Adult Health

## 2023-03-19 ENCOUNTER — Telehealth: Payer: 59 | Admitting: Adult Health

## 2023-03-19 DIAGNOSIS — F4310 Post-traumatic stress disorder, unspecified: Secondary | ICD-10-CM

## 2023-03-19 DIAGNOSIS — F331 Major depressive disorder, recurrent, moderate: Secondary | ICD-10-CM

## 2023-03-19 DIAGNOSIS — F902 Attention-deficit hyperactivity disorder, combined type: Secondary | ICD-10-CM

## 2023-03-19 DIAGNOSIS — G4700 Insomnia, unspecified: Secondary | ICD-10-CM

## 2023-03-19 DIAGNOSIS — F411 Generalized anxiety disorder: Secondary | ICD-10-CM

## 2023-03-19 MED ORDER — CARIPRAZINE HCL 1.5 MG PO CAPS: 1.5000 mg | ORAL_CAPSULE | Freq: Every day | ORAL | 2 refills | Status: DC

## 2023-03-19 NOTE — Progress Notes (Signed)
 [name] [id number] 1970-03-22 53 y.o.  Virtual Visit via Video Note  I connected with pt @ on 03/19/23 at  5:00 PM EST by a video enabled telemedicine application and verified that I am speaking with the correct person using two identifiers.   I discussed the limitations of evaluation and management by telemedicine and the availability of in person appointments. The patient expressed understanding and agreed to proceed.  I discussed the assessment and treatment plan with the patient. The patient was provided an opportunity to ask questions and all were answered. The patient agreed with the plan and demonstrated an understanding of the instructions.   The patient was advised to call back or seek an in-person evaluation if the symptoms worsen or if the condition fails to improve as anticipated.  I provided 25 minutes of non-face-to-face time during this encounter.  The patient was located at home.  The provider was located at [facility].   Angeline LOISE Sayers, NP   Subjective:   Patient ID:  Monica [name] is a 53 y.o. (DOB Oct 10, 1970) female.  Chief Complaint: No chief complaint on file.   HPI CAROLIN QUANG presents for follow-up of MDD, GAD, PTSD, ADD, and insomnia.  Describes mood today as ok. Reports increased tearfulness. Mood symptoms - reports depression and anxiety - more anxious overall. Denies irritability. Reports one recent panic attack. Denies worry, rumination, and over thinking. Denies obsessive thoughts or acts. Reports mood is lower - reports a decline over the past few weeks. She is unable to identify and precipitant - possible seasonal.  Stating I feel really depressed. Feels like current medication regimen is helpful, but is willing to consider other options. Stable interest and motivation. Taking medications as prescribed. Reports seeing a new therapist twice and plans to see her weekly.  Energy levels lower. Active, does not have a regular  exercise routine.   Enjoys some usual interests and activities. Single. Lives alone - with dog. Daughter recently graduated from [employer] - living in [place]. Spending time with family and friends.  Appetite decreased. Reports weight loss - working with a weight loss plan. Sleep has declined. Averages 6 hours - broken sleep. Focus and concentration improved. Completing some tasks. Managing aspects of household. 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  Denies AH or VH. Denies self harm. Denies substance use.  Previous medication trials: Adderall, Trazadone, Clonazepam , Paxil, Sertraline  Review of Systems:  Review of Systems  Musculoskeletal:  Negative for gait problem.  Neurological:  Negative for tremors.  Psychiatric/Behavioral:         Please refer to HPI    Medications: I have reviewed the patient's current medications.  Current Outpatient Medications  Medication Sig Dispense Refill   buPROPion  (WELLBUTRIN  XL) 150 MG 24 hr tablet TAKE THREE TABLETS EVERY MORNING. 270 tablet 0   amphetamine -dextroamphetamine  (ADDERALL XR) 30 MG 24 hr capsule Take 1 capsule (30 mg total) by mouth daily. 30 capsule 0   amphetamine -dextroamphetamine  (ADDERALL XR) 30 MG 24 hr capsule Take 1 capsule (30 mg total) by mouth daily. 30 capsule 0   amphetamine -dextroamphetamine  (ADDERALL) 10 MG tablet Take 1 tablet (10 mg total) by mouth daily. 30 tablet 0   amphetamine -dextroamphetamine  (ADDERALL) 10 MG tablet Take 1 tablet (10 mg total) by mouth daily. 30 tablet 0   brexpiprazole  (REXULTI ) 1 MG TABS tablet Take 1 tablet (1 mg total) by mouth daily. 30 tablet 5   cetirizine (ZYRTEC) 10 MG tablet Take 10 mg  by mouth daily.     clonazePAM  (KLONOPIN ) 1 MG tablet TAKE ONE AND 1/2 TABLETS AT BEDTIME AS NEEDED FOR SLEEP. 45 tablet 2   escitalopram  (LEXAPRO ) 20 MG tablet TAKE 1 TABLET BY MOUTH EVERY DAY 90 tablet 1   fluticasone  (FLONASE ) 50 MCG/ACT nasal spray Place 1 spray daily into both nostrils.      gabapentin  (NEURONTIN ) 300 MG capsule Take 1 capsule (300 mg total) by mouth 2 (two) times daily. 60 capsule 2   Insulin  Pen Needle (B-D UF III MINI PEN NEEDLES) 31G X 5 MM MISC Use once daily as directed 100 each 0   levonorgestrel (MIRENA) 20 MCG/DAY IUD 1 each by Intrauterine route once.     Phentermine-Topiramate  (QSYMIA ) 7.5-46 MG CP24 1 capsule po qAM 30 capsule 0   traZODone  (DESYREL ) 100 MG tablet Take 1-2 tablets (100-200 mg total) by mouth at bedtime. 180 tablet 1   valACYclovir (VALTREX) 500 MG tablet Take 500 mg by mouth 2 (two) times daily as needed (flare up).     Vitamin D , Ergocalciferol , (DRISDOL ) 1.25 MG (50000 UNIT) CAPS capsule Take 1 capsule (50,000 Units total) by mouth every 7 (seven) days. 4 capsule 0   No current facility-administered medications for this visit.    Medication Side Effects: None  Allergies:  Allergies  Allergen Reactions   Pristiq [Desvenlafaxine] Other (See Comments)    Not effective   Sertraline Other (See Comments)    Did not feel emotion   Pepto-Bismol [Bismuth Subsalicylate] Rash   Septra [Sulfamethoxazole-Trimethoprim] Rash   Sulfa Antibiotics Rash    Past Medical History:  Diagnosis Date   ADD (attention deficit disorder)    Anxiety    Cholelithiases    symptomatic   Depression    Environmental and seasonal allergies    GAD (generalized anxiety disorder)    Gallbladder disease    Heart murmur    per pt told very faint , no symptoms, told nothing to worry about;  pt had echo done 12-24-2011 in epic ef 55-60% and trace MR   Herpes simplex type 1 infection    History of MRSA infection    left great toe in 2010   History of palpitations    ED visit in epic 05/ 2017;  evaluated by cardiology--- dr shaunna. okey note in epic 08-31-2015 , ST on EKG , no further work-up;  pt had previou echo in epic 12-24-2011 ef 55-60%   History of suicidal behavior 03/2019   [facility] admission for IVC   Insomnia    unspecified   Palpitations    PTSD  (post-traumatic stress disorder)    Wears glasses     Family History  Problem Relation Age of Onset   Bipolar disorder Mother    Depression Mother    Anxiety disorder Mother    Cancer Father    Lung cancer Father    Migraines Neg Hx     Social History   Socioeconomic History   Marital status: Divorced    Spouse name: Not on file   Number of children: 1   Years of education: Not on file   Highest education level: Bachelor's degree (e.g., BA, AB, BS)  Occupational History   Not on file  Tobacco Use   Smoking status: Former    Current packs/day: 0.00    Types: Cigarettes    Start date: 67    Quit date: 2002    Years since quitting: 23.0   Smokeless tobacco: Never  Vaping Use  Vaping status: Never Used  Substance and Sexual Activity   Alcohol use: Not Currently    Comment: occasional   Drug use: Never   Sexual activity: Not on file  Other Topics Concern   Not on file  Social History Narrative   Lives at home with fiance   Right handed   2 cups of caffeine daily   Social Drivers of Health   Financial Resource Strain: Not on file  Food Insecurity: Not on file  Transportation Needs: Not on file  Physical Activity: Not on file  Stress: Not on file  Social Connections: Not on file  Intimate Partner Violence: Not on file    Past Medical History, Surgical history, Social history, and Family history were reviewed and updated as appropriate.   Please see review of systems for further details on the patient's review from today.   Objective:   Physical Exam:  There were no vitals taken for this visit.  Physical Exam Constitutional:      General: She is not in acute distress. Musculoskeletal:        General: No deformity.  Neurological:     Mental Status: She is alert and oriented to person, place, and time.     Coordination: Coordination normal.  Psychiatric:        Attention and Perception: Attention and perception normal. She does not perceive auditory  or visual hallucinations.        Mood and Affect: Affect is not labile, blunt, angry or inappropriate.        Speech: Speech normal.        Behavior: Behavior normal.        Thought Content: Thought content normal. Thought content is not paranoid or delusional. Thought content does not include homicidal or suicidal ideation. Thought content does not include homicidal or suicidal plan.        Cognition and Memory: Cognition and memory normal.        Judgment: Judgment normal.     Comments: Insight intact     Lab Review:     Component Value Date/Time   NA 140 05/22/2022 0941   K 4.5 05/22/2022 0941   CL 100 05/22/2022 0941   CO2 21 05/22/2022 0941   GLUCOSE 78 05/22/2022 0941   GLUCOSE 108 (H) 04/01/2019 2236   BUN 11 05/22/2022 0941   CREATININE 1.04 (H) 05/22/2022 0941   CALCIUM 9.7 05/22/2022 0941   PROT 7.3 05/22/2022 0941   ALBUMIN 4.5 05/22/2022 0941   AST 16 05/22/2022 0941   ALT 11 05/22/2022 0941   ALKPHOS 87 05/22/2022 0941   BILITOT 0.4 05/22/2022 0941   GFRNONAA >60 04/01/2019 2236   GFRAA >60 04/01/2019 2236       Component Value Date/Time   WBC 7.5 07/03/2022 1431   WBC 8.5 04/01/2019 2236   RBC 4.25 07/03/2022 1431   HGB 12.7 07/03/2022 1431   HCT 37.4 07/03/2022 1431   PLT 495 (H) 07/03/2022 1431   MCV 88.0 07/03/2022 1431   MCH 29.9 07/03/2022 1431   MCHC 34.0 07/03/2022 1431   RDW 13.8 07/03/2022 1431   LYMPHSABS 2.3 07/03/2022 1431   MONOABS 0.5 07/03/2022 1431   EOSABS 0.1 07/03/2022 1431   BASOSABS 0.1 07/03/2022 1431    No results found for: POCLITH, LITHIUM   No results found for: PHENYTOIN, PHENOBARB, VALPROATE, CBMZ   .res Assessment: Plan:    Plan:  PDMP reviewed  Add Vraylar  1.5mg  daily D/C Rexulti  1mg  - take 1/2  tablet daily x 7 days, then d/c  Consider Lamictal next visit  Lexapro  20mg  daily  Clonazepam  1.5mg  at hs Trazdone 100mg  - 2 at hs Wellbutrin  XL 450mg  daily. Denies seizure history.  Gabapentin  300mg   daily to BID - not taking currently  Adderall XR 30mg  every morning ADD symptoms. Adderall 10mg  daily  RTC 4 weeks  Patient advised to contact office with any questions, adverse effects, or acute worsening in signs and symptoms.  Discussed potential benefits, risk, and side effects of benzodiazepines to include potential risk of tolerance and dependence, as well as possible drowsiness.  Advised patient not to drive if experiencing drowsiness and to take lowest possible effective dose to minimize risk of dependence and tolerance.  Discussed potential metabolic side effects associated with atypical antipsychotics, as well as potential risk for movement side effects. Advised pt to contact office if movement side effects occur.    There are no diagnoses linked to this encounter.   Please see After Visit Summary for patient specific instructions.  Future Appointments  Date Time Provider Department Center  03/19/2023  5:00 PM Coolidge Gossard Nattalie, NP [facility] None    No orders of the defined types were placed in this encounter.     -------------------------------

## 2023-03-20 ENCOUNTER — Telehealth: Payer: Self-pay | Admitting: Adult Health

## 2023-03-20 NOTE — Telephone Encounter (Signed)
 PT lvm that the vraylar 1.5 mg that gina prescribed yesterday needs a PA

## 2023-03-23 NOTE — Telephone Encounter (Signed)
 Prior Approval received effective 03/22/2023-03/21/2026 for Vraylar with [employer]

## 2023-04-14 ENCOUNTER — Encounter: Payer: Self-pay | Admitting: Adult Health

## 2023-04-14 ENCOUNTER — Telehealth (INDEPENDENT_AMBULATORY_CARE_PROVIDER_SITE_OTHER): Payer: 59 | Admitting: Adult Health

## 2023-04-14 DIAGNOSIS — F909 Attention-deficit hyperactivity disorder, unspecified type: Secondary | ICD-10-CM | POA: Diagnosis not present

## 2023-04-14 DIAGNOSIS — F4310 Post-traumatic stress disorder, unspecified: Secondary | ICD-10-CM | POA: Diagnosis not present

## 2023-04-14 DIAGNOSIS — F902 Attention-deficit hyperactivity disorder, combined type: Secondary | ICD-10-CM

## 2023-04-14 DIAGNOSIS — F331 Major depressive disorder, recurrent, moderate: Secondary | ICD-10-CM

## 2023-04-14 DIAGNOSIS — F411 Generalized anxiety disorder: Secondary | ICD-10-CM

## 2023-04-14 DIAGNOSIS — G4700 Insomnia, unspecified: Secondary | ICD-10-CM

## 2023-04-14 MED ORDER — AMPHETAMINE-DEXTROAMPHET ER 30 MG PO CP24: 30.0000 mg | ORAL_CAPSULE | Freq: Every day | ORAL | 0 refills | Status: DC

## 2023-04-14 MED ORDER — AMPHETAMINE-DEXTROAMPHETAMINE 10 MG PO TABS: 10.0000 mg | ORAL_TABLET | Freq: Every day | ORAL | 0 refills | Status: DC

## 2023-04-14 NOTE — Progress Notes (Signed)
 Monica [name] [id number] Aug 14, 1970 53 y.o.  Virtual Visit via Video Note  I connected with pt @ on 04/14/23 at  5:30 PM EST by a video enabled telemedicine application and verified that I am speaking with the correct person using two identifiers.   I discussed the limitations of evaluation and management by telemedicine and the availability of in person appointments. The patient expressed understanding and agreed to proceed.  I discussed the assessment and treatment plan with the patient. The patient was provided an opportunity to ask questions and all were answered. The patient agreed with the plan and demonstrated an understanding of the instructions.   The patient was advised to call back or seek an in-person evaluation if the symptoms worsen or if the condition fails to improve as anticipated.  I provided 25 minutes of non-face-to-face time during this encounter.  The patient was located at home.  The provider was located at [facility].   Angeline LOISE Sayers, NP   Subjective:   Patient ID:  Monica [name] is a 53 y.o. (DOB 1971-01-20) female.  Chief Complaint: No chief complaint on file.   HPI Monica [name] presents for follow-up of MDD, GAD, PTSD, ADD, and insomnia.  Describes mood today as ok. Denies tearfulness. Mood symptoms - reports decreased depression and anxiety. Denies irritability. Denies recent panic attack. Denies worry, rumination, and over thinking. Denies obsessive thoughts or acts. Reports mood has improved. Stating I feel like I'm doing better. Feels like current medication regimen is helpful. Reports Stable interest and motivation. Taking medications as prescribed. Reports seeing a new therapist twice and plans to see her weekly.  Energy levels improved. Active, does not have a regular exercise routine.   Enjoys some usual interests and activities. Single. Lives alone - with dog. Daughter recently graduated from [employer] - living in  [place]. Spending time with family and friends.  Appetite decreased. Reports weight loss - working with a weight loss plan. Sleep has improved. Averages 8 to 9 hours. Focus and concentration improved. Completing some tasks. Managing aspects of household. 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  Denies AH or VH. Denies self harm. Denies substance use.  Previous medication trials: Adderall, Trazadone, Clonazepam , Paxil, Sertraline   Review of Systems:  Review of Systems  Musculoskeletal:  Negative for gait problem.  Neurological:  Negative for tremors.  Psychiatric/Behavioral:         Please refer to HPI    Medications: I have reviewed the patient's current medications.  Current Outpatient Medications  Medication Sig Dispense Refill   buPROPion  (WELLBUTRIN  XL) 150 MG 24 hr tablet TAKE THREE TABLETS EVERY MORNING. 270 tablet 0   amphetamine -dextroamphetamine  (ADDERALL XR) 30 MG 24 hr capsule Take 1 capsule (30 mg total) by mouth daily. 30 capsule 0   amphetamine -dextroamphetamine  (ADDERALL XR) 30 MG 24 hr capsule Take 1 capsule (30 mg total) by mouth daily. 30 capsule 0   amphetamine -dextroamphetamine  (ADDERALL) 10 MG tablet Take 1 tablet (10 mg total) by mouth daily. 30 tablet 0   amphetamine -dextroamphetamine  (ADDERALL) 10 MG tablet Take 1 tablet (10 mg total) by mouth daily. 30 tablet 0   brexpiprazole  (REXULTI ) 1 MG TABS tablet Take 1 tablet (1 mg total) by mouth daily. 30 tablet 5   cariprazine  (VRAYLAR ) 1.5 MG capsule Take 1 capsule (1.5 mg total) by mouth daily. 30 capsule 2   cetirizine (ZYRTEC) 10 MG tablet Take 10 mg by mouth daily.     clonazePAM  (KLONOPIN ) 1  MG tablet TAKE ONE AND 1/2 TABLETS AT BEDTIME AS NEEDED FOR SLEEP. 45 tablet 2   escitalopram  (LEXAPRO ) 20 MG tablet TAKE 1 TABLET BY MOUTH EVERY DAY 90 tablet 1   fluticasone  (FLONASE ) 50 MCG/ACT nasal spray Place 1 spray daily into both nostrils.     gabapentin  (NEURONTIN ) 300 MG capsule Take 1 capsule (300 mg  total) by mouth 2 (two) times daily. 60 capsule 2   Insulin  Pen Needle (B-D UF III MINI PEN NEEDLES) 31G X 5 MM MISC Use once daily as directed 100 each 0   levonorgestrel (MIRENA) 20 MCG/DAY IUD 1 each by Intrauterine route once.     Phentermine-Topiramate  (QSYMIA ) 7.5-46 MG CP24 1 capsule po qAM 30 capsule 0   traZODone  (DESYREL ) 100 MG tablet Take 1-2 tablets (100-200 mg total) by mouth at bedtime. 180 tablet 1   valACYclovir (VALTREX) 500 MG tablet Take 500 mg by mouth 2 (two) times daily as needed (flare up).     Vitamin D , Ergocalciferol , (DRISDOL ) 1.25 MG (50000 UNIT) CAPS capsule Take 1 capsule (50,000 Units total) by mouth every 7 (seven) days. 4 capsule 0   No current facility-administered medications for this visit.    Medication Side Effects: None  Allergies:  Allergies  Allergen Reactions   Pristiq [Desvenlafaxine] Other (See Comments)    Not effective   Sertraline Other (See Comments)    Did not feel emotion   Pepto-Bismol [Bismuth Subsalicylate] Rash   Septra [Sulfamethoxazole-Trimethoprim] Rash   Sulfa Antibiotics Rash    Past Medical History:  Diagnosis Date   ADD (attention deficit disorder)    Anxiety    Cholelithiases    symptomatic   Depression    Environmental and seasonal allergies    GAD (generalized anxiety disorder)    Gallbladder disease    Heart murmur    per pt told very faint , no symptoms, told nothing to worry about;  pt had echo done 12-24-2011 in epic ef 55-60% and trace MR   Herpes simplex type 1 infection    History of MRSA infection    left great toe in 2010   History of palpitations    ED visit in epic 05/ 2017;  evaluated by cardiology--- dr shaunna. okey note in epic 08-31-2015 , ST on EKG , no further work-up;  pt had previou echo in epic 12-24-2011 ef 55-60%   History of suicidal behavior 03/2019   [facility] admission for IVC   Insomnia    unspecified   Palpitations    PTSD (post-traumatic stress disorder)    Wears glasses     Family  History  Problem Relation Age of Onset   Bipolar disorder Mother    Depression Mother    Anxiety disorder Mother    Cancer Father    Lung cancer Father    Migraines Neg Hx     Social History   Socioeconomic History   Marital status: Divorced    Spouse name: Not on file   Number of children: 1   Years of education: Not on file   Highest education level: Bachelor's degree (e.g., BA, AB, BS)  Occupational History   Not on file  Tobacco Use   Smoking status: Former    Current packs/day: 0.00    Types: Cigarettes    Start date: 108    Quit date: 2002    Years since quitting: 23.1   Smokeless tobacco: Never  Vaping Use   Vaping status: Never Used  Substance and Sexual  Activity   Alcohol use: Not Currently    Comment: occasional   Drug use: Never   Sexual activity: Not on file  Other Topics Concern   Not on file  Social History Narrative   Lives at home with fiance   Right handed   2 cups of caffeine daily   Social Drivers of Health   Financial Resource Strain: Not on file  Food Insecurity: Not on file  Transportation Needs: Not on file  Physical Activity: Not on file  Stress: Not on file  Social Connections: Not on file  Intimate Partner Violence: Not on file    Past Medical History, Surgical history, Social history, and Family history were reviewed and updated as appropriate.   Please see review of systems for further details on the patient's review from today.   Objective:   Physical Exam:  There were no vitals taken for this visit.  Physical Exam Constitutional:      General: She is not in acute distress. Musculoskeletal:        General: No deformity.  Neurological:     Mental Status: She is alert and oriented to person, place, and time.     Coordination: Coordination normal.  Psychiatric:        Attention and Perception: Attention and perception normal. She does not perceive auditory or visual hallucinations.        Mood and Affect: Affect is not  labile, blunt, angry or inappropriate.        Speech: Speech normal.        Behavior: Behavior normal.        Thought Content: Thought content normal. Thought content is not paranoid or delusional. Thought content does not include homicidal or suicidal ideation. Thought content does not include homicidal or suicidal plan.        Cognition and Memory: Cognition and memory normal.        Judgment: Judgment normal.     Comments: Insight intact     Lab Review:     Component Value Date/Time   NA 140 05/22/2022 0941   K 4.5 05/22/2022 0941   CL 100 05/22/2022 0941   CO2 21 05/22/2022 0941   GLUCOSE 78 05/22/2022 0941   GLUCOSE 108 (H) 04/01/2019 2236   BUN 11 05/22/2022 0941   CREATININE 1.04 (H) 05/22/2022 0941   CALCIUM 9.7 05/22/2022 0941   PROT 7.3 05/22/2022 0941   ALBUMIN 4.5 05/22/2022 0941   AST 16 05/22/2022 0941   ALT 11 05/22/2022 0941   ALKPHOS 87 05/22/2022 0941   BILITOT 0.4 05/22/2022 0941   GFRNONAA >60 04/01/2019 2236   GFRAA >60 04/01/2019 2236       Component Value Date/Time   WBC 7.5 07/03/2022 1431   WBC 8.5 04/01/2019 2236   RBC 4.25 07/03/2022 1431   HGB 12.7 07/03/2022 1431   HCT 37.4 07/03/2022 1431   PLT 495 (H) 07/03/2022 1431   MCV 88.0 07/03/2022 1431   MCH 29.9 07/03/2022 1431   MCHC 34.0 07/03/2022 1431   RDW 13.8 07/03/2022 1431   LYMPHSABS 2.3 07/03/2022 1431   MONOABS 0.5 07/03/2022 1431   EOSABS 0.1 07/03/2022 1431   BASOSABS 0.1 07/03/2022 1431    No results found for: POCLITH, LITHIUM   No results found for: PHENYTOIN, PHENOBARB, VALPROATE, CBMZ   .res Assessment: Plan:    Plan:  PDMP reviewed  Vraylar  1.5mg  daily  Consider Lamictal next visit  Lexapro  20mg  daily  Clonazepam  1.5mg  at hs Trazdone  100mg  - 2 at hs Wellbutrin  XL 450mg  daily. Denies seizure history.  Gabapentin  300mg  daily to BID - not taking currently  Adderall XR 30mg  every morning ADD symptoms. Adderall 10mg  daily  RTC 4 weeks  25  minutes spent dedicated to the care of this patient on the date of this encounter to include pre-visit review of records, ordering of medication, post visit documentation, and face-to-face time with the patient discussing MDD, GAD, PTSD, ADD, and insomnia. Discussed continuing current medication regimen.  Patient advised to contact office with any questions, adverse effects, or acute worsening in signs and symptoms.  Discussed potential benefits, risk, and side effects of benzodiazepines to include potential risk of tolerance and dependence, as well as possible drowsiness. Advised patient not to drive if experiencing drowsiness and to take lowest possible effective dose to minimize risk of dependence and tolerance.  Discussed potential metabolic side effects associated with atypical antipsychotics, as well as potential risk for movement side effects. Advised pt to contact office if movement side effects occur.   There are no diagnoses linked to this encounter.   Please see After Visit Summary for patient specific instructions.  Future Appointments  Date Time Provider Department Center  04/14/2023  5:30 PM Mieczyslaw Stamas Nattalie, NP [facility] None    No orders of the defined types were placed in this encounter.     -------------------------------

## 2023-05-13 ENCOUNTER — Telehealth: Payer: 59 | Admitting: Adult Health

## 2023-05-20 ENCOUNTER — Encounter: Payer: Self-pay | Admitting: Adult Health

## 2023-05-20 ENCOUNTER — Telehealth: Admitting: Adult Health

## 2023-05-20 DIAGNOSIS — F902 Attention-deficit hyperactivity disorder, combined type: Secondary | ICD-10-CM

## 2023-05-20 DIAGNOSIS — G4700 Insomnia, unspecified: Secondary | ICD-10-CM

## 2023-05-20 DIAGNOSIS — F909 Attention-deficit hyperactivity disorder, unspecified type: Secondary | ICD-10-CM | POA: Diagnosis not present

## 2023-05-20 DIAGNOSIS — F4310 Post-traumatic stress disorder, unspecified: Secondary | ICD-10-CM

## 2023-05-20 DIAGNOSIS — F411 Generalized anxiety disorder: Secondary | ICD-10-CM | POA: Diagnosis not present

## 2023-05-20 DIAGNOSIS — F331 Major depressive disorder, recurrent, moderate: Secondary | ICD-10-CM

## 2023-05-20 MED ORDER — AMPHETAMINE-DEXTROAMPHETAMINE 10 MG PO TABS: 10.0000 mg | ORAL_TABLET | Freq: Every day | ORAL | 0 refills | Status: DC

## 2023-05-20 MED ORDER — AMPHETAMINE-DEXTROAMPHET ER 30 MG PO CP24
30.0000 mg | ORAL_CAPSULE | Freq: Every day | ORAL | 0 refills | Status: DC
Start: 2023-06-17 — End: 2023-07-01

## 2023-05-20 MED ORDER — CARIPRAZINE HCL 1.5 MG PO CAPS: 1.5000 mg | ORAL_CAPSULE | Freq: Every day | ORAL | 2 refills | Status: DC

## 2023-05-20 MED ORDER — AMPHETAMINE-DEXTROAMPHET ER 30 MG PO CP24: 30.0000 mg | ORAL_CAPSULE | Freq: Every day | ORAL | 0 refills | Status: DC

## 2023-05-20 MED ORDER — CLONAZEPAM 1 MG PO TABS: ORAL_TABLET | ORAL | 2 refills | Status: DC

## 2023-05-20 MED ORDER — GABAPENTIN 300 MG PO CAPS: 300.0000 mg | ORAL_CAPSULE | Freq: Two times a day (BID) | ORAL | 2 refills | Status: DC

## 2023-05-20 MED ORDER — ESCITALOPRAM OXALATE 20 MG PO TABS: 20.0000 mg | ORAL_TABLET | Freq: Every day | ORAL | 1 refills | Status: DC

## 2023-05-20 MED ORDER — BUPROPION HCL ER (XL) 150 MG PO TB24: ORAL_TABLET | ORAL | 0 refills | Status: DC

## 2023-05-20 MED ORDER — AMPHETAMINE-DEXTROAMPHETAMINE 10 MG PO TABS
10.0000 mg | ORAL_TABLET | Freq: Every day | ORAL | 0 refills | Status: DC
Start: 2023-06-17 — End: 2023-07-01

## 2023-05-20 MED ORDER — TRAZODONE HCL 100 MG PO TABS: 100.0000 mg | ORAL_TABLET | Freq: Every day | ORAL | 1 refills | Status: DC

## 2023-05-20 NOTE — Progress Notes (Signed)
 Monica [name] [id number] [birth year] 53 y.o.  Virtual Visit via Video Note  I connected with pt @ on 05/20/23 at  3:00 PM EDT by a video enabled telemedicine application and verified that I am speaking with the correct person using two identifiers.   I discussed the limitations of evaluation and management by telemedicine and the availability of in person appointments. The patient expressed understanding and agreed to proceed.  I discussed the assessment and treatment plan with the patient. The patient was provided an opportunity to ask questions and all were answered. The patient agreed with the plan and demonstrated an understanding of the instructions.   The patient was advised to call back or seek an in-person evaluation if the symptoms worsen or if the condition fails to improve as anticipated.  I provided 25 minutes of non-face-to-face time during this encounter.  The patient was located at home.  The provider was located at [facility].   Dorothyann Gibbs, NP   Subjective:   Patient ID:  Monica [name] is a 53 y.o. (DOB 26-Jan-1971) female.  Chief Complaint: No chief complaint on file.   HPI LASHUN RAMSEYER presents for follow-up of MDD, GAD, PTSD, ADD, and insomnia.  Describes mood today as "ok". Denies tearfulness. Mood symptoms - denies depression, anxiety or irritability. Reports stable interest and motivation. Denies recent panic attack. Denies worry, rumination, and over thinking. Denies obsessive thoughts or acts. Reports mood is stable. Stating "I feel like I'm doing ok". Taking medications as prescribed. Seeing therapist bi-weekly.  Energy levels improved. Active, does not have a regular exercise routine.   Enjoys some usual interests and activities. Single. Lives alone - with dog. Daughter recently graduated from [employer] - living in [place]. Spending time with family and friends.  Appetite adequate. Reports weight loss - working with a weight loss  plan. Sleeps better some nights than others. Averages 8 hours. Focus and concentration stable. Completing some tasks. Managing aspects of household. 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  Denies AH or VH. Denies self harm. Denies substance use.  Previous medication trials: Adderall, Trazadone, Clonazepam, Paxil, Sertraline  Review of Systems:  Review of Systems  Musculoskeletal:  Negative for gait problem.  Neurological:  Negative for tremors.  Psychiatric/Behavioral:         Please refer to HPI    Medications: I have reviewed the patient's current medications.  Current Outpatient Medications  Medication Sig Dispense Refill   buPROPion (WELLBUTRIN XL) 150 MG 24 hr tablet TAKE THREE TABLETS EVERY MORNING. 270 tablet 0   amphetamine-dextroamphetamine (ADDERALL XR) 30 MG 24 hr capsule Take 1 capsule (30 mg total) by mouth daily. 30 capsule 0   amphetamine-dextroamphetamine (ADDERALL XR) 30 MG 24 hr capsule Take 1 capsule (30 mg total) by mouth daily. 30 capsule 0   amphetamine-dextroamphetamine (ADDERALL) 10 MG tablet Take 1 tablet (10 mg total) by mouth daily. 30 tablet 0   amphetamine-dextroamphetamine (ADDERALL) 10 MG tablet Take 1 tablet (10 mg total) by mouth daily. 30 tablet 0   cariprazine (VRAYLAR) 1.5 MG capsule Take 1 capsule (1.5 mg total) by mouth daily. 30 capsule 2   cetirizine (ZYRTEC) 10 MG tablet Take 10 mg by mouth daily.     clonazePAM (KLONOPIN) 1 MG tablet TAKE ONE AND 1/2 TABLETS AT BEDTIME AS NEEDED FOR SLEEP. 45 tablet 2   escitalopram (LEXAPRO) 20 MG tablet TAKE 1 TABLET BY MOUTH EVERY DAY 90 tablet 1   fluticasone (FLONASE) 50  MCG/ACT nasal spray Place 1 spray daily into both nostrils.     gabapentin (NEURONTIN) 300 MG capsule Take 1 capsule (300 mg total) by mouth 2 (two) times daily. 60 capsule 2   Insulin Pen Needle (B-D UF III MINI PEN NEEDLES) 31G X 5 MM MISC Use once daily as directed 100 each 0   levonorgestrel (MIRENA) 20 MCG/DAY IUD 1 each by  Intrauterine route once.     Phentermine-Topiramate (QSYMIA) 7.5-46 MG CP24 1 capsule po qAM 30 capsule 0   traZODone (DESYREL) 100 MG tablet Take 1-2 tablets (100-200 mg total) by mouth at bedtime. 180 tablet 1   valACYclovir (VALTREX) 500 MG tablet Take 500 mg by mouth 2 (two) times daily as needed (flare up).     Vitamin D, Ergocalciferol, (DRISDOL) 1.25 MG (50000 UNIT) CAPS capsule Take 1 capsule (50,000 Units total) by mouth every 7 (seven) days. 4 capsule 0   No current facility-administered medications for this visit.    Medication Side Effects: None  Allergies:  Allergies  Allergen Reactions   Pristiq [Desvenlafaxine] Other (See Comments)    Not effective   Sertraline Other (See Comments)    Did not feel emotion   Pepto-Bismol [Bismuth Subsalicylate] Rash   Septra [Sulfamethoxazole-Trimethoprim] Rash   Sulfa Antibiotics Rash    Past Medical History:  Diagnosis Date   ADD (attention deficit disorder)    Anxiety    Cholelithiases    symptomatic   Depression    Environmental and seasonal allergies    GAD (generalized anxiety disorder)    Gallbladder disease    Heart murmur    per pt told very faint , no symptoms, told nothing to worry about;  pt had echo done 12-24-2011 in epic ef 55-60% and trace MR   Herpes simplex type 1 infection    History of MRSA infection    left great toe in 2010   History of palpitations    ED visit in epic 05/ 2017;  evaluated by cardiology--- dr Demetrius Charity. Tenny Craw note in epic 08-31-2015 , ST on EKG , no further work-up;  pt had previou echo in epic 12-24-2011 ef 55-60%   History of suicidal behavior 03/2019   [facility] admission for IVC   Insomnia    unspecified   Palpitations    PTSD (post-traumatic stress disorder)    Wears glasses     Family History  Problem Relation Age of Onset   Bipolar disorder Mother    Depression Mother    Anxiety disorder Mother    Cancer Father    Lung cancer Father    Migraines Neg Hx     Social History    Socioeconomic History   Marital status: Divorced    Spouse name: Not on file   Number of children: 1   Years of education: Not on file   Highest education level: Bachelor's degree (e.g., BA, AB, BS)  Occupational History   Not on file  Tobacco Use   Smoking status: Former    Current packs/day: 0.00    Types: Cigarettes    Start date: 74    Quit date: 2002    Years since quitting: 23.2   Smokeless tobacco: Never  Vaping Use   Vaping status: Never Used  Substance and Sexual Activity   Alcohol use: Not Currently    Comment: occasional   Drug use: Never   Sexual activity: Not on file  Other Topics Concern   Not on file  Social History Narrative  Lives at home with fiance   Right handed   2 cups of caffeine daily   Social Drivers of Health   Financial Resource Strain: Not on file  Food Insecurity: Not on file  Transportation Needs: Not on file  Physical Activity: Not on file  Stress: Not on file  Social Connections: Not on file  Intimate Partner Violence: Not on file    Past Medical History, Surgical history, Social history, and Family history were reviewed and updated as appropriate.   Please see review of systems for further details on the patient's review from today.   Objective:   Physical Exam:  There were no vitals taken for this visit.  Physical Exam Constitutional:      General: She is not in acute distress. Musculoskeletal:        General: No deformity.  Neurological:     Mental Status: She is alert and oriented to person, place, and time.     Coordination: Coordination normal.  Psychiatric:        Attention and Perception: Attention and perception normal. She does not perceive auditory or visual hallucinations.        Mood and Affect: Affect is not labile, blunt, angry or inappropriate.        Speech: Speech normal.        Behavior: Behavior normal.        Thought Content: Thought content normal. Thought content is not paranoid or delusional.  Thought content does not include homicidal or suicidal ideation. Thought content does not include homicidal or suicidal plan.        Cognition and Memory: Cognition and memory normal.        Judgment: Judgment normal.     Comments: Insight intact     Lab Review:     Component Value Date/Time   NA 140 05/22/2022 0941   K 4.5 05/22/2022 0941   CL 100 05/22/2022 0941   CO2 21 05/22/2022 0941   GLUCOSE 78 05/22/2022 0941   GLUCOSE 108 (H) 04/01/2019 2236   BUN 11 05/22/2022 0941   CREATININE 1.04 (H) 05/22/2022 0941   CALCIUM 9.7 05/22/2022 0941   PROT 7.3 05/22/2022 0941   ALBUMIN 4.5 05/22/2022 0941   AST 16 05/22/2022 0941   ALT 11 05/22/2022 0941   ALKPHOS 87 05/22/2022 0941   BILITOT 0.4 05/22/2022 0941   GFRNONAA >60 04/01/2019 2236   GFRAA >60 04/01/2019 2236       Component Value Date/Time   WBC 7.5 07/03/2022 1431   WBC 8.5 04/01/2019 2236   RBC 4.25 07/03/2022 1431   HGB 12.7 07/03/2022 1431   HCT 37.4 07/03/2022 1431   PLT 495 (H) 07/03/2022 1431   MCV 88.0 07/03/2022 1431   MCH 29.9 07/03/2022 1431   MCHC 34.0 07/03/2022 1431   RDW 13.8 07/03/2022 1431   LYMPHSABS 2.3 07/03/2022 1431   MONOABS 0.5 07/03/2022 1431   EOSABS 0.1 07/03/2022 1431   BASOSABS 0.1 07/03/2022 1431    No results found for: "POCLITH", "LITHIUM"   No results found for: "PHENYTOIN", "PHENOBARB", "VALPROATE", "CBMZ"   .res Assessment: Plan:    Plan:  PDMP reviewed  Vraylar 1.5mg  daily  Consider Lamictal next visit  Lexapro 20mg  daily  Clonazepam 1.5mg  at hs Trazdone 100mg  - 2 at hs Wellbutrin XL 450mg  daily. Denies seizure history.  Gabapentin 300mg  daily to BID - not taking currently  Adderall XR 30mg  every morning ADD symptoms. Adderall 10mg  daily  RTC 6 weeks  25  minutes spent dedicated to the care of this patient on the date of this encounter to include pre-visit review of records, ordering of medication, post visit documentation, and face-to-face time with the  patient discussing MDD, GAD, PTSD, ADD, and insomnia. Discussed continuing current medication regimen.  Patient advised to contact office with any questions, adverse effects, or acute worsening in signs and symptoms.  Discussed potential benefits, risk, and side effects of benzodiazepines to include potential risk of tolerance and dependence, as well as possible drowsiness. Advised patient not to drive if experiencing drowsiness and to take lowest possible effective dose to minimize risk of dependence and tolerance.  Discussed potential metabolic side effects associated with atypical antipsychotics, as well as potential risk for movement side effects. Advised pt to contact office if movement side effects occur.   There are no diagnoses linked to this encounter.   Please see After Visit Summary for patient specific instructions.  Future Appointments  Date Time Provider Department Center  05/20/2023  3:00 PM Buck Mcaffee, Thereasa Solo, NP CP-[facility] None    No orders of the defined types were placed in this encounter.     -------------------------------

## 2023-07-01 ENCOUNTER — Telehealth: Admitting: Adult Health

## 2023-07-01 ENCOUNTER — Encounter: Payer: Self-pay | Admitting: Adult Health

## 2023-07-01 DIAGNOSIS — F4310 Post-traumatic stress disorder, unspecified: Secondary | ICD-10-CM

## 2023-07-01 DIAGNOSIS — F902 Attention-deficit hyperactivity disorder, combined type: Secondary | ICD-10-CM

## 2023-07-01 DIAGNOSIS — F331 Major depressive disorder, recurrent, moderate: Secondary | ICD-10-CM

## 2023-07-01 DIAGNOSIS — G4700 Insomnia, unspecified: Secondary | ICD-10-CM

## 2023-07-01 DIAGNOSIS — F411 Generalized anxiety disorder: Secondary | ICD-10-CM | POA: Diagnosis not present

## 2023-07-01 MED ORDER — AMPHETAMINE-DEXTROAMPHETAMINE 10 MG PO TABS
10.0000 mg | ORAL_TABLET | Freq: Every day | ORAL | 0 refills | Status: DC
Start: 2023-07-29 — End: 2023-08-12

## 2023-07-01 MED ORDER — AMPHETAMINE-DEXTROAMPHETAMINE 10 MG PO TABS: 10.0000 mg | ORAL_TABLET | Freq: Every day | ORAL | 0 refills | Status: DC

## 2023-07-01 MED ORDER — AMPHETAMINE-DEXTROAMPHET ER 30 MG PO CP24: 30.0000 mg | ORAL_CAPSULE | Freq: Every day | ORAL | 0 refills | Status: DC

## 2023-07-01 MED ORDER — AMPHETAMINE-DEXTROAMPHET ER 30 MG PO CP24
30.0000 mg | ORAL_CAPSULE | Freq: Every day | ORAL | 0 refills | Status: DC
Start: 2023-07-01 — End: 2023-08-12

## 2023-07-01 NOTE — Progress Notes (Signed)
 Monica [name] [id number] 1970/10/29 53 y.o.  Virtual Visit via Video Note  I connected with pt @ on 07/01/23 at  3:00 PM EDT by a video enabled telemedicine application and verified that I am speaking with the correct person using two identifiers.   I discussed the limitations of evaluation and management by telemedicine and the availability of in person appointments. The patient expressed understanding and agreed to proceed.  I discussed the assessment and treatment plan with the patient. The patient was provided an opportunity to ask questions and all were answered. The patient agreed with the plan and demonstrated an understanding of the instructions.   The patient was advised to call back or seek an in-person evaluation if the symptoms worsen or if the condition fails to improve as anticipated.  I provided 25 minutes of non-face-to-face time during this encounter.  The patient was located at home.  The provider was located at [facility].   Monica Camera, NP   Subjective:   Patient ID:  Monica [name] is a 53 y.o. (DOB [birth year]) female.  Chief Complaint: No chief complaint on file.   HPI Monica [name] presents for follow-up of MDD, GAD, PTSD, ADD, and insomnia.  Describes mood today as "ok". Denies tearfulness. Mood symptoms - denies depression, anxiety or irritability. Reports stable interest and motivation. Denies recent panic attacks. Denies worry, rumination and over thinking. Denies obsessive thoughts or acts. Reports mood is stable. Stating "I feel like I'm doing good". Taking medications as prescribed. Seeing therapist monthly.  Energy levels improved. Active, does not have a regular exercise routine.   Enjoys some usual interests and activities. Single. Lives alone - with dog. Daughter living in [place]. Spending time with family and friends.  Appetite adequate. Reports weight loss - has a weight loss plan. Sleeps better some nights than others.  Averages 8 hours. Focus and concentration "could be better" - Adderall helpful. Completing some tasks. Managing aspects of household. 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  Denies AH or VH. Denies self harm. Denies substance use.  Previous medication trials: Adderall, Trazadone, Clonazepam , Paxil, Sertraline, Gabapentin    Review of Systems:  Review of Systems  Musculoskeletal:  Negative for gait problem.  Neurological:  Negative for tremors.  Psychiatric/Behavioral:         Please refer to HPI    Medications: I have reviewed the patient's current medications.  Current Outpatient Medications  Medication Sig Dispense Refill   amphetamine -dextroamphetamine  (ADDERALL XR) 30 MG 24 hr capsule Take 1 capsule (30 mg total) by mouth daily. 30 capsule 0   [START ON 07/29/2023] amphetamine -dextroamphetamine  (ADDERALL XR) 30 MG 24 hr capsule Take 1 capsule (30 mg total) by mouth daily. 30 capsule 0   amphetamine -dextroamphetamine  (ADDERALL) 10 MG tablet Take 1 tablet (10 mg total) by mouth daily. 30 tablet 0   [START ON 07/29/2023] amphetamine -dextroamphetamine  (ADDERALL) 10 MG tablet Take 1 tablet (10 mg total) by mouth daily. 30 tablet 0   buPROPion  (WELLBUTRIN  XL) 150 MG 24 hr tablet TAKE THREE TABLETS EVERY MORNING. 270 tablet 0   cariprazine  (VRAYLAR ) 1.5 MG capsule Take 1 capsule (1.5 mg total) by mouth daily. 30 capsule 2   cetirizine (ZYRTEC) 10 MG tablet Take 10 mg by mouth daily.     clonazePAM  (KLONOPIN ) 1 MG tablet TAKE ONE AND 1/2 TABLETS AT BEDTIME AS NEEDED FOR SLEEP. 45 tablet 2   escitalopram  (LEXAPRO ) 20 MG tablet Take 1 tablet (20 mg total) by mouth daily.  90 tablet 1   fluticasone  (FLONASE ) 50 MCG/ACT nasal spray Place 1 spray daily into both nostrils.     gabapentin  (NEURONTIN ) 300 MG capsule Take 1 capsule (300 mg total) by mouth 2 (two) times daily. 60 capsule 2   Insulin  Pen Needle (B-D UF III MINI PEN NEEDLES) 31G X 5 MM MISC Use once daily as directed 100 each 0    levonorgestrel (MIRENA) 20 MCG/DAY IUD 1 each by Intrauterine route once.     Phentermine-Topiramate  (QSYMIA ) 7.5-46 MG CP24 1 capsule po qAM 30 capsule 0   traZODone  (DESYREL ) 100 MG tablet Take 1-2 tablets (100-200 mg total) by mouth at bedtime. 180 tablet 1   valACYclovir (VALTREX) 500 MG tablet Take 500 mg by mouth 2 (two) times daily as needed (flare up).     Vitamin D , Ergocalciferol , (DRISDOL ) 1.25 MG (50000 UNIT) CAPS capsule Take 1 capsule (50,000 Units total) by mouth every 7 (seven) days. 4 capsule 0   No current facility-administered medications for this visit.    Medication Side Effects: None  Allergies:  Allergies  Allergen Reactions   Pristiq [Desvenlafaxine] Other (See Comments)    Not effective   Sertraline Other (See Comments)    Did not feel emotion   Pepto-Bismol [Bismuth Subsalicylate] Rash   Septra [Sulfamethoxazole-Trimethoprim] Rash   Sulfa Antibiotics Rash    Past Medical History:  Diagnosis Date   ADD (attention deficit disorder)    Anxiety    Cholelithiases    symptomatic   Depression    Environmental and seasonal allergies    GAD (generalized anxiety disorder)    Gallbladder disease    Heart murmur    per pt told very faint , no symptoms, told nothing to worry about;  pt had echo done 12-24-2011 in epic ef 55-60% and trace MR   Herpes simplex type 1 infection    History of MRSA infection    left great toe in 2010   History of palpitations    ED visit in epic 05/ 2017;  evaluated by cardiology--- dr Arlester Ladd. Avanell Bob note in epic 08-31-2015 , ST on EKG , no further work-up;  pt had previou echo in epic 12-24-2011 ef 55-60%   History of suicidal behavior 03/2019   [facility] admission for IVC   Insomnia    unspecified   Palpitations    PTSD (post-traumatic stress disorder)    Wears glasses     Family History  Problem Relation Age of Onset   Bipolar disorder Mother    Depression Mother    Anxiety disorder Mother    Cancer Father    Lung cancer Father     Migraines Neg Hx     Social History   Socioeconomic History   Marital status: Divorced    Spouse name: Not on file   Number of children: 1   Years of education: Not on file   Highest education level: Bachelor's degree (e.g., BA, AB, BS)  Occupational History   Not on file  Tobacco Use   Smoking status: Former    Current packs/day: 0.00    Types: Cigarettes    Start date: 45    Quit date: 2002    Years since quitting: 23.3   Smokeless tobacco: Never  Vaping Use   Vaping status: Never Used  Substance and Sexual Activity   Alcohol use: Not Currently    Comment: occasional   Drug use: Never   Sexual activity: Not on file  Other Topics Concern  Not on file  Social History Narrative   Lives at home with fiance   Right handed   2 cups of caffeine daily   Social Drivers of Corporate investment banker Strain: Not on file  Food Insecurity: Not on file  Transportation Needs: Not on file  Physical Activity: Not on file  Stress: Not on file  Social Connections: Not on file  Intimate Partner Violence: Not on file    Past Medical History, Surgical history, Social history, and Family history were reviewed and updated as appropriate.   Please see review of systems for further details on the patient's review from today.   Objective:   Physical Exam:  There were no vitals taken for this visit.  Physical Exam Constitutional:      General: She is not in acute distress. Musculoskeletal:        General: No deformity.  Neurological:     Mental Status: She is alert and oriented to person, place, and time.     Coordination: Coordination normal.  Psychiatric:        Attention and Perception: Attention and perception normal. She does not perceive auditory or visual hallucinations.        Mood and Affect: Mood normal. Affect is not labile, blunt, angry or inappropriate.        Speech: Speech normal.        Behavior: Behavior normal.        Thought Content: Thought content  normal. Thought content is not paranoid or delusional. Thought content does not include homicidal or suicidal ideation. Thought content does not include homicidal or suicidal plan.        Cognition and Memory: Cognition and memory normal.        Judgment: Judgment normal.     Comments: Insight intact     Lab Review:     Component Value Date/Time   NA 140 05/22/2022 0941   K 4.5 05/22/2022 0941   CL 100 05/22/2022 0941   CO2 21 05/22/2022 0941   GLUCOSE 78 05/22/2022 0941   GLUCOSE 108 (H) 04/01/2019 2236   BUN 11 05/22/2022 0941   CREATININE 1.04 (H) 05/22/2022 0941   CALCIUM 9.7 05/22/2022 0941   PROT 7.3 05/22/2022 0941   ALBUMIN 4.5 05/22/2022 0941   AST 16 05/22/2022 0941   ALT 11 05/22/2022 0941   ALKPHOS 87 05/22/2022 0941   BILITOT 0.4 05/22/2022 0941   GFRNONAA >60 04/01/2019 2236   GFRAA >60 04/01/2019 2236       Component Value Date/Time   WBC 7.5 07/03/2022 1431   WBC 8.5 04/01/2019 2236   RBC 4.25 07/03/2022 1431   HGB 12.7 07/03/2022 1431   HCT 37.4 07/03/2022 1431   PLT 495 (H) 07/03/2022 1431   MCV 88.0 07/03/2022 1431   MCH 29.9 07/03/2022 1431   MCHC 34.0 07/03/2022 1431   RDW 13.8 07/03/2022 1431   LYMPHSABS 2.3 07/03/2022 1431   MONOABS 0.5 07/03/2022 1431   EOSABS 0.1 07/03/2022 1431   BASOSABS 0.1 07/03/2022 1431    No results found for: "POCLITH", "LITHIUM"   No results found for: "PHENYTOIN", "PHENOBARB", "VALPROATE", "CBMZ"   .res Assessment: Plan:    Plan:  PDMP reviewed  Vraylar  1.5mg  daily Lexapro  20mg  daily  Clonazepam  1.5mg  at hs Trazdone 100mg  - 2 at hs Wellbutrin  XL 450mg  daily. Denies seizure history.  Adderall XR 30mg  every morning ADD symptoms. Adderall 10mg  daily  Consider Lamictal next visit  D/C Gabapentin  300mg  daily to  BID   RTC 6 weeks  25 minutes spent dedicated to the care of this patient on the date of this encounter to include pre-visit review of records, ordering of medication, post visit  documentation, and face-to-face time with the patient discussing MDD, GAD, PTSD, ADD, and insomnia. Discussed continuing current medication regimen.  Patient advised to contact office with any questions, adverse effects, or acute worsening in signs and symptoms.  Discussed potential benefits, risk, and side effects of benzodiazepines to include potential risk of tolerance and dependence, as well as possible drowsiness. Advised patient not to drive if experiencing drowsiness and to take lowest possible effective dose to minimize risk of dependence and tolerance.  Discussed potential metabolic side effects associated with atypical antipsychotics, as well as potential risk for movement side effects. Advised pt to contact office if movement side effects occur.   Diagnoses and all orders for this visit:  Major depressive disorder, recurrent episode, moderate (HCC)  Generalized anxiety disorder  Attention deficit hyperactivity disorder (ADHD), combined type -     amphetamine -dextroamphetamine  (ADDERALL XR) 30 MG 24 hr capsule; Take 1 capsule (30 mg total) by mouth daily. -     amphetamine -dextroamphetamine  (ADDERALL XR) 30 MG 24 hr capsule; Take 1 capsule (30 mg total) by mouth daily. -     amphetamine -dextroamphetamine  (ADDERALL) 10 MG tablet; Take 1 tablet (10 mg total) by mouth daily. -     amphetamine -dextroamphetamine  (ADDERALL) 10 MG tablet; Take 1 tablet (10 mg total) by mouth daily.  Insomnia, unspecified type  PTSD (post-traumatic stress disorder)     Please see After Visit Summary for patient specific instructions.  No future appointments.   No orders of the defined types were placed in this encounter.     -------------------------------

## 2023-08-12 ENCOUNTER — Telehealth: Admitting: Adult Health

## 2023-08-12 ENCOUNTER — Encounter: Payer: Self-pay | Admitting: Adult Health

## 2023-08-12 DIAGNOSIS — F339 Major depressive disorder, recurrent, unspecified: Secondary | ICD-10-CM | POA: Diagnosis not present

## 2023-08-12 DIAGNOSIS — F4310 Post-traumatic stress disorder, unspecified: Secondary | ICD-10-CM | POA: Diagnosis not present

## 2023-08-12 DIAGNOSIS — F411 Generalized anxiety disorder: Secondary | ICD-10-CM | POA: Diagnosis not present

## 2023-08-12 DIAGNOSIS — F331 Major depressive disorder, recurrent, moderate: Secondary | ICD-10-CM

## 2023-08-12 DIAGNOSIS — F989 Unspecified behavioral and emotional disorders with onset usually occurring in childhood and adolescence: Secondary | ICD-10-CM

## 2023-08-12 DIAGNOSIS — F902 Attention-deficit hyperactivity disorder, combined type: Secondary | ICD-10-CM

## 2023-08-12 DIAGNOSIS — G4700 Insomnia, unspecified: Secondary | ICD-10-CM

## 2023-08-12 MED ORDER — CLONAZEPAM 1 MG PO TABS: ORAL_TABLET | ORAL | 2 refills | Status: DC

## 2023-08-12 MED ORDER — AMPHETAMINE-DEXTROAMPHET ER 30 MG PO CP24: 30.0000 mg | ORAL_CAPSULE | Freq: Every day | ORAL | 0 refills | Status: DC

## 2023-08-12 MED ORDER — TRAZODONE HCL 100 MG PO TABS
100.0000 mg | ORAL_TABLET | Freq: Every day | ORAL | 1 refills | Status: DC
Start: 2023-08-12 — End: 2023-12-31

## 2023-08-12 MED ORDER — ESCITALOPRAM OXALATE 20 MG PO TABS: 20.0000 mg | ORAL_TABLET | Freq: Every day | ORAL | 1 refills | Status: DC

## 2023-08-12 MED ORDER — BUPROPION HCL ER (XL) 150 MG PO TB24: ORAL_TABLET | ORAL | 0 refills | Status: DC

## 2023-08-12 MED ORDER — CARIPRAZINE HCL 1.5 MG PO CAPS: 1.5000 mg | ORAL_CAPSULE | Freq: Every day | ORAL | 2 refills | Status: DC

## 2023-08-12 MED ORDER — AMPHETAMINE-DEXTROAMPHETAMINE 10 MG PO TABS: 10.0000 mg | ORAL_TABLET | Freq: Every day | ORAL | 0 refills | Status: DC

## 2023-08-12 NOTE — Progress Notes (Signed)
 Monica [name] [id number] 1970/08/20 54 y.o.  Virtual Visit via Video Note  I connected with pt @ on 08/12/23 at  5:30 PM EDT by a video enabled telemedicine application and verified that I am speaking with the correct person using two identifiers.   I discussed the limitations of evaluation and management by telemedicine and the availability of in person appointments. The patient expressed understanding and agreed to proceed.  I discussed the assessment and treatment plan with the patient. The patient was provided an opportunity to ask questions and all were answered. The patient agreed with the plan and demonstrated an understanding of the instructions.   The patient was advised to call back or seek an in-person evaluation if the symptoms worsen or if the condition fails to improve as anticipated.  I provided 25 minutes of non-face-to-face time during this encounter.  The patient was located at home.  The provider was located at [facility].   Monica Camera, NP   Subjective:   Patient ID:  Monica [name] is a 53 y.o. (DOB 1970/04/06) female.  Chief Complaint: No chief complaint on file.   HPI Monica [name] presents for follow-up of MDD, GAD, PTSD, ADD, and insomnia.  Describes mood today as "ok". Denies tearfulness. Mood symptoms - denies depression, anxiety or irritability. Reports stable interest and motivation. Denies recent panic attacks. Denies worry and rumination. Reports some over thinking - work related. Denies obsessive thoughts or acts. Reports mood is stable. Stating "I feel like I'm doing ok". Taking medications as prescribed. Seeing therapist monthly.  Energy levels improved. Active, does not have a regular exercise routine.   Enjoys some usual interests and activities. Single. Lives alone - with dog. Daughter living in [place]. Spending time with family and friends.  Appetite adequate. Reports weight loss - [employer]. Sleeps better some nights than  others. Averages 8 hours. Focus and concentration stable - Adderall helpful. Completing some tasks. Managing aspects of household. 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  Denies AH or VH. Denies self harm. Denies substance use.  Previous medication trials: Adderall, Trazadone, Clonazepam , Paxil, Sertraline, Gabapentin    Review of Systems:  Review of Systems  Musculoskeletal:  Negative for gait problem.  Neurological:  Negative for tremors.  Psychiatric/Behavioral:         Please refer to HPI    Medications: I have reviewed the patient's current medications.  Current Outpatient Medications  Medication Sig Dispense Refill   amphetamine -dextroamphetamine  (ADDERALL XR) 30 MG 24 hr capsule Take 1 capsule (30 mg total) by mouth daily. 30 capsule 0   amphetamine -dextroamphetamine  (ADDERALL XR) 30 MG 24 hr capsule Take 1 capsule (30 mg total) by mouth daily. 30 capsule 0   amphetamine -dextroamphetamine  (ADDERALL) 10 MG tablet Take 1 tablet (10 mg total) by mouth daily. 30 tablet 0   amphetamine -dextroamphetamine  (ADDERALL) 10 MG tablet Take 1 tablet (10 mg total) by mouth daily. 30 tablet 0   buPROPion  (WELLBUTRIN  XL) 150 MG 24 hr tablet TAKE THREE TABLETS EVERY MORNING. 270 tablet 0   cariprazine  (VRAYLAR ) 1.5 MG capsule Take 1 capsule (1.5 mg total) by mouth daily. 30 capsule 2   cetirizine (ZYRTEC) 10 MG tablet Take 10 mg by mouth daily.     clonazePAM  (KLONOPIN ) 1 MG tablet TAKE ONE AND 1/2 TABLETS AT BEDTIME AS NEEDED FOR SLEEP. 45 tablet 2   escitalopram  (LEXAPRO ) 20 MG tablet Take 1 tablet (20 mg total) by mouth daily. 90 tablet 1   fluticasone  (FLONASE )  50 MCG/ACT nasal spray Place 1 spray daily into both nostrils.     gabapentin  (NEURONTIN ) 300 MG capsule Take 1 capsule (300 mg total) by mouth 2 (two) times daily. 60 capsule 2   Insulin  Pen Needle (B-D UF III MINI PEN NEEDLES) 31G X 5 MM MISC Use once daily as directed 100 each 0   levonorgestrel (MIRENA) 20 MCG/DAY IUD 1 each  by Intrauterine route once.     Phentermine-Topiramate  (QSYMIA ) 7.5-46 MG CP24 1 capsule po qAM 30 capsule 0   traZODone  (DESYREL ) 100 MG tablet Take 1-2 tablets (100-200 mg total) by mouth at bedtime. 180 tablet 1   valACYclovir (VALTREX) 500 MG tablet Take 500 mg by mouth 2 (two) times daily as needed (flare up).     Vitamin D , Ergocalciferol , (DRISDOL ) 1.25 MG (50000 UNIT) CAPS capsule Take 1 capsule (50,000 Units total) by mouth every 7 (seven) days. 4 capsule 0   No current facility-administered medications for this visit.    Medication Side Effects: None  Allergies:  Allergies  Allergen Reactions   Pristiq [Desvenlafaxine] Other (See Comments)    Not effective   Sertraline Other (See Comments)    Did not feel emotion   Pepto-Bismol [Bismuth Subsalicylate] Rash   Septra [Sulfamethoxazole-Trimethoprim] Rash   Sulfa Antibiotics Rash    Past Medical History:  Diagnosis Date   ADD (attention deficit disorder)    Anxiety    Cholelithiases    symptomatic   Depression    Environmental and seasonal allergies    GAD (generalized anxiety disorder)    Gallbladder disease    Heart murmur    per pt told very faint , no symptoms, told nothing to worry about;  pt had echo done 12-24-2011 in epic ef 55-60% and trace MR   Herpes simplex type 1 infection    History of MRSA infection    left great toe in 2010   History of palpitations    ED visit in epic 05/ 2017;  evaluated by cardiology--- dr Arlester Ladd. Avanell Bob note in epic 08-31-2015 , ST on EKG , no further work-up;  pt had previou echo in epic 12-24-2011 ef 55-60%   History of suicidal behavior 03/2019   [facility] admission for IVC   Insomnia    unspecified   Palpitations    PTSD (post-traumatic stress disorder)    Wears glasses     Family History  Problem Relation Age of Onset   Bipolar disorder Mother    Depression Mother    Anxiety disorder Mother    Cancer Father    Lung cancer Father    Migraines Neg Hx     Social History    Socioeconomic History   Marital status: Divorced    Spouse name: Not on file   Number of children: 1   Years of education: Not on file   Highest education level: Bachelor's degree (e.g., BA, AB, BS)  Occupational History   Not on file  Tobacco Use   Smoking status: Former    Current packs/day: 0.00    Types: Cigarettes    Start date: 77    Quit date: 2002    Years since quitting: 23.4   Smokeless tobacco: Never  Vaping Use   Vaping status: Never Used  Substance and Sexual Activity   Alcohol use: Not Currently    Comment: occasional   Drug use: Never   Sexual activity: Not on file  Other Topics Concern   Not on file  Social History Narrative  Lives at home with fiance   Right handed   2 cups of caffeine daily   Social Drivers of Health   Financial Resource Strain: Not on file  Food Insecurity: Not on file  Transportation Needs: Not on file  Physical Activity: Not on file  Stress: Not on file  Social Connections: Not on file  Intimate Partner Violence: Not on file    Past Medical History, Surgical history, Social history, and Family history were reviewed and updated as appropriate.   Please see review of systems for further details on the patient's review from today.   Objective:   Physical Exam:  There were no vitals taken for this visit.  Physical Exam  Lab Review:     Component Value Date/Time   NA 140 05/22/2022 0941   K 4.5 05/22/2022 0941   CL 100 05/22/2022 0941   CO2 21 05/22/2022 0941   GLUCOSE 78 05/22/2022 0941   GLUCOSE 108 (H) 04/01/2019 2236   BUN 11 05/22/2022 0941   CREATININE 1.04 (H) 05/22/2022 0941   CALCIUM 9.7 05/22/2022 0941   PROT 7.3 05/22/2022 0941   ALBUMIN 4.5 05/22/2022 0941   AST 16 05/22/2022 0941   ALT 11 05/22/2022 0941   ALKPHOS 87 05/22/2022 0941   BILITOT 0.4 05/22/2022 0941   GFRNONAA >60 04/01/2019 2236   GFRAA >60 04/01/2019 2236       Component Value Date/Time   WBC 7.5 07/03/2022 1431   WBC 8.5  04/01/2019 2236   RBC 4.25 07/03/2022 1431   HGB 12.7 07/03/2022 1431   HCT 37.4 07/03/2022 1431   PLT 495 (H) 07/03/2022 1431   MCV 88.0 07/03/2022 1431   MCH 29.9 07/03/2022 1431   MCHC 34.0 07/03/2022 1431   RDW 13.8 07/03/2022 1431   LYMPHSABS 2.3 07/03/2022 1431   MONOABS 0.5 07/03/2022 1431   EOSABS 0.1 07/03/2022 1431   BASOSABS 0.1 07/03/2022 1431    No results found for: "POCLITH", "LITHIUM"   No results found for: "PHENYTOIN", "PHENOBARB", "VALPROATE", "CBMZ"   .res Assessment: Plan:    Plan:  PDMP reviewed  Vraylar  1.5mg  daily Lexapro  20mg  daily  Clonazepam  1.5mg  at hs Trazdone 100mg  - 2 at hs Wellbutrin  XL 450mg  daily. Denies seizure history.  Adderall XR 30mg  every morning ADD symptoms. Adderall 10mg  daily  Consider Lamictal next visit  RTC 8 weeks  25 minutes spent dedicated to the care of this patient on the date of this encounter to include pre-visit review of records, ordering of medication, post visit documentation, and face-to-face time with the patient discussing MDD, GAD, PTSD, ADD, and insomnia. Discussed continuing current medication regimen.  Patient advised to contact office with any questions, adverse effects, or acute worsening in signs and symptoms.  Discussed potential benefits, risk, and side effects of benzodiazepines to include potential risk of tolerance and dependence, as well as possible drowsiness. Advised patient not to drive if experiencing drowsiness and to take lowest possible effective dose to minimize risk of dependence and tolerance.  Discussed potential metabolic side effects associated with atypical antipsychotics, as well as potential risk for movement side effects. Advised pt to contact office if movement side effects occur.  There are no diagnoses linked to this encounter.   Please see After Visit Summary for patient specific instructions.  No future appointments.  No orders of the defined types were placed in this  encounter.     -------------------------------

## 2023-08-17 ENCOUNTER — Other Ambulatory Visit (HOSPITAL_COMMUNITY): Payer: Self-pay

## 2023-10-12 ENCOUNTER — Other Ambulatory Visit: Payer: Self-pay | Admitting: Adult Health

## 2023-10-12 DIAGNOSIS — F331 Major depressive disorder, recurrent, moderate: Secondary | ICD-10-CM

## 2023-11-20 ENCOUNTER — Other Ambulatory Visit: Payer: Self-pay

## 2023-11-20 ENCOUNTER — Telehealth: Payer: Self-pay | Admitting: Adult Health

## 2023-11-20 DIAGNOSIS — F902 Attention-deficit hyperactivity disorder, combined type: Secondary | ICD-10-CM

## 2023-11-20 DIAGNOSIS — F331 Major depressive disorder, recurrent, moderate: Secondary | ICD-10-CM

## 2023-11-20 MED ORDER — CARIPRAZINE HCL 1.5 MG PO CAPS: 1.5000 mg | ORAL_CAPSULE | Freq: Every day | ORAL | 0 refills | Status: DC

## 2023-11-20 MED ORDER — AMPHETAMINE-DEXTROAMPHET ER 30 MG PO CP24: 30.0000 mg | ORAL_CAPSULE | Freq: Every day | ORAL | 0 refills | Status: DC

## 2023-11-20 MED ORDER — AMPHETAMINE-DEXTROAMPHETAMINE 10 MG PO TABS: 10.0000 mg | ORAL_TABLET | Freq: Every day | ORAL | 0 refills | Status: DC

## 2023-11-20 NOTE — Telephone Encounter (Signed)
 Sent in Vraylar  1.5 mg RF. Pended both Adderall RF.

## 2023-11-20 NOTE — Telephone Encounter (Signed)
 Pt called and made an appt for 09/26. She needs refills on her adderall xr 30 mg and adderall 10 mg and her vraylar  1.5 mg. [facility]acy [facility] [place]

## 2023-12-04 ENCOUNTER — Telehealth (INDEPENDENT_AMBULATORY_CARE_PROVIDER_SITE_OTHER): Admitting: Adult Health

## 2023-12-04 ENCOUNTER — Encounter: Payer: Self-pay | Admitting: Adult Health

## 2023-12-04 DIAGNOSIS — F331 Major depressive disorder, recurrent, moderate: Secondary | ICD-10-CM | POA: Diagnosis not present

## 2023-12-04 DIAGNOSIS — F4310 Post-traumatic stress disorder, unspecified: Secondary | ICD-10-CM | POA: Diagnosis not present

## 2023-12-04 DIAGNOSIS — F909 Attention-deficit hyperactivity disorder, unspecified type: Secondary | ICD-10-CM

## 2023-12-04 DIAGNOSIS — F902 Attention-deficit hyperactivity disorder, combined type: Secondary | ICD-10-CM

## 2023-12-04 DIAGNOSIS — F411 Generalized anxiety disorder: Secondary | ICD-10-CM | POA: Diagnosis not present

## 2023-12-04 DIAGNOSIS — G4700 Insomnia, unspecified: Secondary | ICD-10-CM

## 2023-12-04 MED ORDER — AMPHETAMINE-DEXTROAMPHETAMINE 10 MG PO TABS: 10.0000 mg | ORAL_TABLET | Freq: Every day | ORAL | 0 refills | Status: DC

## 2023-12-04 MED ORDER — CLONAZEPAM 1 MG PO TABS
ORAL_TABLET | ORAL | 2 refills | Status: DC
Start: 2023-12-04 — End: 2023-12-31

## 2023-12-04 MED ORDER — CARIPRAZINE HCL 1.5 MG PO CAPS: 1.5000 mg | ORAL_CAPSULE | Freq: Every day | ORAL | 5 refills | Status: DC

## 2023-12-04 MED ORDER — AMPHETAMINE-DEXTROAMPHETAMINE 10 MG PO TABS
10.0000 mg | ORAL_TABLET | Freq: Every day | ORAL | 0 refills | Status: DC
Start: 2024-01-01 — End: 2023-12-31

## 2023-12-04 MED ORDER — AMPHETAMINE-DEXTROAMPHET ER 30 MG PO CP24
30.0000 mg | ORAL_CAPSULE | Freq: Every day | ORAL | 0 refills | Status: DC
Start: 2023-12-04 — End: 2023-12-31

## 2023-12-04 MED ORDER — AMPHETAMINE-DEXTROAMPHET ER 30 MG PO CP24: 30.0000 mg | ORAL_CAPSULE | Freq: Every day | ORAL | 0 refills | Status: DC

## 2023-12-04 NOTE — Progress Notes (Signed)
 Monica [name] [id number] Dec 18, 1970 53 y.o.  Virtual Visit via Video Note  I connected with pt @ on 12/04/23 at  4:30 PM EDT by a video enabled telemedicine application and verified that I am speaking with the correct person using two identifiers.   I discussed the limitations of evaluation and management by telemedicine and the availability of in person appointments. The patient expressed understanding and agreed to proceed.  I discussed the assessment and treatment plan with the patient. The patient was provided an opportunity to ask questions and all were answered. The patient agreed with the plan and demonstrated an understanding of the instructions.   The patient was advised to call back or seek an in-person evaluation if the symptoms worsen or if the condition fails to improve as anticipated.  I provided 25 minutes of non-face-to-face time during this encounter.  The patient was located at home.  The provider was located at [facility].   Monica LOISE Sayers, NP   Subjective:   Patient ID:  Monica [name] is a 53 y.o. (DOB 18-Jun-1970) female.  Chief Complaint: No chief complaint on file.   HPI Monica [name] presents for follow-up of MDD, GAD, PTSD, ADD, and insomnia.  Describes mood today as ok. Denies tearfulness. Mood symptoms - denies depression and anxiety. Reports situational irritability. Reports stable interest and motivation. Denies recent panic attacks. Denies worry and rumination. Denies over thinking. Denies obsessive thoughts or acts. Reports mood is stable. Stating I feel like I'm doing alright. Taking medications as prescribed. Seeing therapist monthly.  Energy levels improved. Active, does not have a regular exercise routine.  Enjoys some usual interests and activities. Single. Lives alone - with dog - Ragan. Daughter living in [place]. Spending time with family and friends.  Appetite adequate. Reports weight gain. Sleeps better some nights than  others. Averages 7 hours. Focus and concentration stable - Adderall helpful. Completing some tasks. Managing aspects of household. 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  Denies AH or VH. Denies self harm. Denies substance use.   Previous medication trials: Adderall, Trazadone, Clonazepam , Paxil, Sertraline, Gabapentin   Review of Systems:  Review of Systems  Musculoskeletal:  Negative for gait problem.  Neurological:  Negative for tremors.  Psychiatric/Behavioral:         Please refer to HPI    Medications: I have reviewed the patient's current medications.  Current Outpatient Medications  Medication Sig Dispense Refill   amphetamine -dextroamphetamine  (ADDERALL XR) 30 MG 24 hr capsule Take 1 capsule (30 mg total) by mouth daily. 30 capsule 0   amphetamine -dextroamphetamine  (ADDERALL XR) 30 MG 24 hr capsule Take 1 capsule (30 mg total) by mouth daily. 30 capsule 0   amphetamine -dextroamphetamine  (ADDERALL) 10 MG tablet Take 1 tablet (10 mg total) by mouth daily. 30 tablet 0   amphetamine -dextroamphetamine  (ADDERALL) 10 MG tablet Take 1 tablet (10 mg total) by mouth daily. 30 tablet 0   buPROPion  (WELLBUTRIN  XL) 150 MG 24 hr tablet TAKE THREE TABLETS EVERY MORNING. 270 tablet 0   cariprazine  (VRAYLAR ) 1.5 MG capsule Take 1 capsule (1.5 mg total) by mouth daily. 30 capsule 0   cetirizine (ZYRTEC) 10 MG tablet Take 10 mg by mouth daily.     clonazePAM  (KLONOPIN ) 1 MG tablet TAKE ONE AND 1/2 TABLETS AT BEDTIME AS NEEDED FOR SLEEP. 45 tablet 2   escitalopram  (LEXAPRO ) 20 MG tablet Take 1 tablet (20 mg total) by mouth daily. 90 tablet 1   fluticasone  (FLONASE ) 50 MCG/ACT nasal  spray Place 1 spray daily into both nostrils.     Insulin  Pen Needle (B-D UF III MINI PEN NEEDLES) 31G X 5 MM MISC Use once daily as directed 100 each 0   levonorgestrel (MIRENA) 20 MCG/DAY IUD 1 each by Intrauterine route once.     Phentermine-Topiramate  (QSYMIA ) 7.5-46 MG CP24 1 capsule po qAM 30 capsule 0    traZODone  (DESYREL ) 100 MG tablet Take 1-2 tablets (100-200 mg total) by mouth at bedtime. 180 tablet 1   valACYclovir (VALTREX) 500 MG tablet Take 500 mg by mouth 2 (two) times daily as needed (flare up).     Vitamin D , Ergocalciferol , (DRISDOL ) 1.25 MG (50000 UNIT) CAPS capsule Take 1 capsule (50,000 Units total) by mouth every 7 (seven) days. 4 capsule 0   No current facility-administered medications for this visit.    Medication Side Effects: None  Allergies:  Allergies  Allergen Reactions   Pristiq [Desvenlafaxine] Other (See Comments)    Not effective   Sertraline Other (See Comments)    Did not feel emotion   Pepto-Bismol [Bismuth Subsalicylate] Rash   Septra [Sulfamethoxazole-Trimethoprim] Rash   Sulfa Antibiotics Rash    Past Medical History:  Diagnosis Date   ADD (attention deficit disorder)    Anxiety    Cholelithiases    symptomatic   Depression    Environmental and seasonal allergies    GAD (generalized anxiety disorder)    Gallbladder disease    Heart murmur    per pt told very faint , no symptoms, told nothing to worry about;  pt had echo done 12-24-2011 in epic ef 55-60% and trace MR   Herpes simplex type 1 infection    History of MRSA infection    left great toe in 2010   History of palpitations    ED visit in epic 05/ 2017;  evaluated by cardiology--- dr shaunna. okey note in epic 08-31-2015 , ST on EKG , no further work-up;  pt had previou echo in epic 12-24-2011 ef 55-60%   History of suicidal behavior 03/2019   [facility] admission for IVC   Insomnia    unspecified   Palpitations    PTSD (post-traumatic stress disorder)    Wears glasses     Family History  Problem Relation Age of Onset   Bipolar disorder Mother    Depression Mother    Anxiety disorder Mother    Cancer Father    Lung cancer Father    Migraines Neg Hx     Social History   Socioeconomic History   Marital status: Divorced    Spouse name: Not on file   Number of children: 1   Years of  education: Not on file   Highest education level: Bachelor's degree (e.g., BA, AB, BS)  Occupational History   Not on file  Tobacco Use   Smoking status: Former    Current packs/day: 0.00    Types: Cigarettes    Start date: 78    Quit date: 2002    Years since quitting: 23.7   Smokeless tobacco: Never  Vaping Use   Vaping status: Never Used  Substance and Sexual Activity   Alcohol use: Not Currently    Comment: occasional   Drug use: Never   Sexual activity: Not on file  Other Topics Concern   Not on file  Social History Narrative   Lives at home with fiance   Right handed   2 cups of caffeine daily   Social Drivers of Health  Financial Resource Strain: Not on file  Food Insecurity: Not on file  Transportation Needs: Not on file  Physical Activity: Not on file  Stress: Not on file  Social Connections: Not on file  Intimate Partner Violence: Not on file    Past Medical History, Surgical history, Social history, and Family history were reviewed and updated as appropriate.   Please see review of systems for further details on the patient's review from today.   Objective:   Physical Exam:  There were no vitals taken for this visit.  Physical Exam Constitutional:      General: She is not in acute distress. Musculoskeletal:        General: No deformity.  Neurological:     Mental Status: She is alert and oriented to person, place, and time.     Coordination: Coordination normal.  Psychiatric:        Attention and Perception: Attention and perception normal. She does not perceive auditory or visual hallucinations.        Mood and Affect: Mood normal. Mood is not anxious or depressed. Affect is not labile, blunt, angry or inappropriate.        Speech: Speech normal.        Behavior: Behavior normal.        Thought Content: Thought content normal. Thought content is not paranoid or delusional. Thought content does not include homicidal or suicidal ideation. Thought  content does not include homicidal or suicidal plan.        Cognition and Memory: Cognition and memory normal.        Judgment: Judgment normal.     Comments: Insight intact     Lab Review:     Component Value Date/Time   NA 140 05/22/2022 0941   K 4.5 05/22/2022 0941   CL 100 05/22/2022 0941   CO2 21 05/22/2022 0941   GLUCOSE 78 05/22/2022 0941   GLUCOSE 108 (H) 04/01/2019 2236   BUN 11 05/22/2022 0941   CREATININE 1.04 (H) 05/22/2022 0941   CALCIUM 9.7 05/22/2022 0941   PROT 7.3 05/22/2022 0941   ALBUMIN 4.5 05/22/2022 0941   AST 16 05/22/2022 0941   ALT 11 05/22/2022 0941   ALKPHOS 87 05/22/2022 0941   BILITOT 0.4 05/22/2022 0941   GFRNONAA >60 04/01/2019 2236   GFRAA >60 04/01/2019 2236       Component Value Date/Time   WBC 7.5 07/03/2022 1431   WBC 8.5 04/01/2019 2236   RBC 4.25 07/03/2022 1431   HGB 12.7 07/03/2022 1431   HCT 37.4 07/03/2022 1431   PLT 495 (H) 07/03/2022 1431   MCV 88.0 07/03/2022 1431   MCH 29.9 07/03/2022 1431   MCHC 34.0 07/03/2022 1431   RDW 13.8 07/03/2022 1431   LYMPHSABS 2.3 07/03/2022 1431   MONOABS 0.5 07/03/2022 1431   EOSABS 0.1 07/03/2022 1431   BASOSABS 0.1 07/03/2022 1431    No results found for: POCLITH, LITHIUM   No results found for: PHENYTOIN, PHENOBARB, VALPROATE, CBMZ   .res Assessment: Plan:    Plan:  PDMP reviewed  Vraylar  1.5mg  daily Lexapro  20mg  daily  Clonazepam  1.5mg  at hs Trazdone 100mg  - 2 at hs Wellbutrin  XL 450mg  daily. Denies seizure history.  Adderall XR 30mg  every morning ADD symptoms. Adderall 10mg  daily  Consider Lamictal next visit  RTC 8 weeks  25 minutes spent dedicated to the care of this patient on the date of this encounter to include pre-visit review of records, ordering of medication, post visit documentation,  and face-to-face time with the patient discussing MDD, GAD, PTSD, ADD, and insomnia. Discussed continuing current medication regimen.  Patient advised to  contact office with any questions, adverse effects, or acute worsening in signs and symptoms.  Discussed potential benefits, risk, and side effects of benzodiazepines to include potential risk of tolerance and dependence, as well as possible drowsiness. Advised patient not to drive if experiencing drowsiness and to take lowest possible effective dose to minimize risk of dependence and tolerance.  Discussed potential metabolic side effects associated with atypical antipsychotics, as well as potential risk for movement side effects. Advised pt to contact office if movement side effects occur.   There are no diagnoses linked to this encounter.   Please see After Visit Summary for patient specific instructions.  Future Appointments  Date Time Provider Department Center  12/04/2023  4:30 PM Merdith Boyd Nattalie, NP [facility] None    No orders of the defined types were placed in this encounter.     -------------------------------

## 2023-12-31 ENCOUNTER — Other Ambulatory Visit: Payer: Self-pay

## 2023-12-31 ENCOUNTER — Telehealth (INDEPENDENT_AMBULATORY_CARE_PROVIDER_SITE_OTHER): Admitting: Adult Health

## 2023-12-31 ENCOUNTER — Encounter: Payer: Self-pay | Admitting: Adult Health

## 2023-12-31 DIAGNOSIS — F331 Major depressive disorder, recurrent, moderate: Secondary | ICD-10-CM

## 2023-12-31 DIAGNOSIS — F411 Generalized anxiety disorder: Secondary | ICD-10-CM

## 2023-12-31 DIAGNOSIS — G4700 Insomnia, unspecified: Secondary | ICD-10-CM | POA: Diagnosis not present

## 2023-12-31 DIAGNOSIS — F4310 Post-traumatic stress disorder, unspecified: Secondary | ICD-10-CM | POA: Diagnosis not present

## 2023-12-31 DIAGNOSIS — F902 Attention-deficit hyperactivity disorder, combined type: Secondary | ICD-10-CM

## 2023-12-31 DIAGNOSIS — Z6833 Body mass index (BMI) 33.0-33.9, adult: Secondary | ICD-10-CM

## 2023-12-31 MED ORDER — AMPHETAMINE-DEXTROAMPHET ER 30 MG PO CP24: 30.0000 mg | ORAL_CAPSULE | Freq: Every day | ORAL | 0 refills | Status: DC

## 2023-12-31 MED ORDER — TRAZODONE HCL 100 MG PO TABS: 100.0000 mg | ORAL_TABLET | Freq: Every day | ORAL | 1 refills | Status: DC

## 2023-12-31 MED ORDER — PROPRANOLOL HCL 10 MG PO TABS: 10.0000 mg | ORAL_TABLET | Freq: Three times a day (TID) | ORAL | 0 refills | Status: DC

## 2023-12-31 MED ORDER — CLONAZEPAM 1 MG PO TABS: ORAL_TABLET | ORAL | 2 refills | Status: DC

## 2023-12-31 MED ORDER — BUPROPION HCL ER (XL) 150 MG PO TB24: ORAL_TABLET | ORAL | 0 refills | Status: DC

## 2023-12-31 MED ORDER — TRAZODONE HCL 100 MG PO TABS: 100.0000 mg | ORAL_TABLET | Freq: Every day | ORAL | 1 refills | Status: AC

## 2023-12-31 MED ORDER — CARIPRAZINE HCL 3 MG PO CAPS: 3.0000 mg | ORAL_CAPSULE | Freq: Every day | ORAL | 2 refills | Status: DC

## 2023-12-31 MED ORDER — ESCITALOPRAM OXALATE 20 MG PO TABS: 20.0000 mg | ORAL_TABLET | Freq: Every day | ORAL | 1 refills | Status: DC

## 2023-12-31 MED ORDER — AMPHETAMINE-DEXTROAMPHETAMINE 10 MG PO TABS: 10.0000 mg | ORAL_TABLET | Freq: Every day | ORAL | 0 refills | Status: DC

## 2023-12-31 MED ORDER — PROPRANOLOL HCL 10 MG PO TABS
10.0000 mg | ORAL_TABLET | Freq: Three times a day (TID) | ORAL | 0 refills | Status: AC
Start: 2023-12-31 — End: ?

## 2023-12-31 MED ORDER — PROPRANOLOL HCL 10 MG PO TABS
10.0000 mg | ORAL_TABLET | Freq: Three times a day (TID) | ORAL | 0 refills | Status: DC
Start: 2023-12-31 — End: 2023-12-31

## 2023-12-31 MED ORDER — ESCITALOPRAM OXALATE 20 MG PO TABS
20.0000 mg | ORAL_TABLET | Freq: Every day | ORAL | 1 refills | Status: AC
Start: 2023-12-31 — End: ?

## 2023-12-31 MED ORDER — CARIPRAZINE HCL 3 MG PO CAPS
3.0000 mg | ORAL_CAPSULE | Freq: Every day | ORAL | 2 refills | Status: DC
Start: 2023-12-31 — End: 2023-12-31

## 2023-12-31 MED ORDER — ESCITALOPRAM OXALATE 20 MG PO TABS
20.0000 mg | ORAL_TABLET | Freq: Every day | ORAL | 1 refills | Status: DC
Start: 2023-12-31 — End: 2023-12-31

## 2023-12-31 NOTE — Progress Notes (Signed)
 Monica [name] [id number] December 06, 1970 53 y.o.  Virtual Visit via Video Note  I connected with pt @ on 12/31/23 at  5:00 PM EDT by a video enabled telemedicine application and verified that I am speaking with the correct person using two identifiers.   I discussed the limitations of evaluation and management by telemedicine and the availability of in person appointments. The patient expressed understanding and agreed to proceed.  I discussed the assessment and treatment plan with the patient. The patient was provided an opportunity to ask questions and all were answered. The patient agreed with the plan and demonstrated an understanding of the instructions.   The patient was advised to call back or seek an in-person evaluation if the symptoms worsen or if the condition fails to improve as anticipated.  I provided 25 minutes of non-face-to-face time during this encounter.  The patient was located at home.  The provider was located at [facility].   Angeline LOISE Sayers, NP   Subjective:   Patient ID:  Monica [name] is a 53 y.o. (DOB 05/26/1970) female.  Chief Complaint: No chief complaint on file.   HPI Monica [name] presents for follow-up of MDD, GAD, PTSD, ADD, and insomnia.  Describes mood today as ok. Reports tearfulness. Mood symptoms - reports increased anxiety, depression and irritability. Reports lower interest and motivation. Reports recent panic attacks. Reports worry and rumination. Reports over thinking - work related. Denies obsessive thoughts or acts. Reports recent loss of her 71 and 16/71 year old dog Ragan three  weeks ago and has been struggling with mood instability. Reports mood is lower. Stating I don't feel like I'm doing too good - I feel devastated. Taking medications as prescribed. Seeing therapist monthly.  Energy levels lower. Active, does not have a regular exercise routine.  Enjoys some usual interests and activities. Single. Lives alone.  Daughter living in [place]. Spending time with family and friends.  Appetite adequate. Reports weight loss - Optivia. Sleeps better some nights than others. Averages 7 to 8 hours of broken sleep. Reports focus and concentration difficulties - Adderall helpful. Completing some tasks. Managing aspects of household. 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  Denies AH or VH. Denies self harm. Denies substance use.   Previous medication trials: Adderall, Trazadone, Clonazepam , Paxil, Sertraline, Gabapentin    Review of Systems:  Review of Systems  Musculoskeletal:  Negative for gait problem.  Neurological:  Negative for tremors.  Psychiatric/Behavioral:         Please refer to HPI    Medications: I have reviewed the patient's current medications.  Current Outpatient Medications  Medication Sig Dispense Refill   amphetamine -dextroamphetamine  (ADDERALL XR) 30 MG 24 hr capsule Take 1 capsule (30 mg total) by mouth daily. 30 capsule 0   [START ON 01/01/2024] amphetamine -dextroamphetamine  (ADDERALL XR) 30 MG 24 hr capsule Take 1 capsule (30 mg total) by mouth daily. 30 capsule 0   amphetamine -dextroamphetamine  (ADDERALL) 10 MG tablet Take 1 tablet (10 mg total) by mouth daily. 30 tablet 0   [START ON 01/01/2024] amphetamine -dextroamphetamine  (ADDERALL) 10 MG tablet Take 1 tablet (10 mg total) by mouth daily. 30 tablet 0   buPROPion  (WELLBUTRIN  XL) 150 MG 24 hr tablet TAKE THREE TABLETS EVERY MORNING. 270 tablet 0   cariprazine  (VRAYLAR ) 3 MG capsule Take 1 capsule (3 mg total) by mouth daily. 30 capsule 2   cetirizine (ZYRTEC) 10 MG tablet Take 10 mg by mouth daily.     clonazePAM  (KLONOPIN ) 1 MG  tablet TAKE ONE TABLET TWICE DAILY. 60 tablet 2   escitalopram  (LEXAPRO ) 20 MG tablet Take 1 tablet (20 mg total) by mouth daily. 90 tablet 1   fluticasone  (FLONASE ) 50 MCG/ACT nasal spray Place 1 spray daily into both nostrils.     Insulin  Pen Needle (B-D UF III MINI PEN NEEDLES) 31G X 5 MM MISC  Use once daily as directed 100 each 0   levonorgestrel (MIRENA) 20 MCG/DAY IUD 1 each by Intrauterine route once.     Phentermine-Topiramate  (QSYMIA ) 7.5-46 MG CP24 1 capsule po qAM 30 capsule 0   propranolol (INDERAL) 10 MG tablet Take 1 tablet (10 mg total) by mouth 3 (three) times daily. 90 tablet 0   traZODone  (DESYREL ) 100 MG tablet Take 1-2 tablets (100-200 mg total) by mouth at bedtime. 180 tablet 1   valACYclovir (VALTREX) 500 MG tablet Take 500 mg by mouth 2 (two) times daily as needed (flare up).     Vitamin D , Ergocalciferol , (DRISDOL ) 1.25 MG (50000 UNIT) CAPS capsule Take 1 capsule (50,000 Units total) by mouth every 7 (seven) days. 4 capsule 0   No current facility-administered medications for this visit.    Medication Side Effects: None  Allergies:  Allergies  Allergen Reactions   Pristiq [Desvenlafaxine] Other (See Comments)    Not effective   Sertraline Other (See Comments)    Did not feel emotion   Pepto-Bismol [Bismuth Subsalicylate] Rash   Septra [Sulfamethoxazole-Trimethoprim] Rash   Sulfa Antibiotics Rash    Past Medical History:  Diagnosis Date   ADD (attention deficit disorder)    Anxiety    Cholelithiases    symptomatic   Depression    Environmental and seasonal allergies    GAD (generalized anxiety disorder)    Gallbladder disease    Heart murmur    per pt told very faint , no symptoms, told nothing to worry about;  pt had echo done 12-24-2011 in epic ef 55-60% and trace MR   Herpes simplex type 1 infection    History of MRSA infection    left great toe in 2010   History of palpitations    ED visit in epic 05/ 2017;  evaluated by cardiology--- dr shaunna. okey note in epic 08-31-2015 , ST on EKG , no further work-up;  pt had previou echo in epic 12-24-2011 ef 55-60%   History of suicidal behavior 03/2019   [facility] admission for IVC   Insomnia    unspecified   Palpitations    PTSD (post-traumatic stress disorder)    Wears glasses     Family History   Problem Relation Age of Onset   Bipolar disorder Mother    Depression Mother    Anxiety disorder Mother    Cancer Father    Lung cancer Father    Migraines Neg Hx     Social History   Socioeconomic History   Marital status: Divorced    Spouse name: Not on file   Number of children: 1   Years of education: Not on file   Highest education level: Bachelor's degree (e.g., BA, AB, BS)  Occupational History   Not on file  Tobacco Use   Smoking status: Former    Current packs/day: 0.00    Types: Cigarettes    Start date: 38    Quit date: 2002    Years since quitting: 23.8   Smokeless tobacco: Never  Vaping Use   Vaping status: Never Used  Substance and Sexual Activity   Alcohol use:  Not Currently    Comment: occasional   Drug use: Never   Sexual activity: Not on file  Other Topics Concern   Not on file  Social History Narrative   Lives at home with fiance   Right handed   2 cups of caffeine daily   Social Drivers of Health   Financial Resource Strain: Not on file  Food Insecurity: Not on file  Transportation Needs: Not on file  Physical Activity: Not on file  Stress: Not on file  Social Connections: Not on file  Intimate Partner Violence: Not on file    Past Medical History, Surgical history, Social history, and Family history were reviewed and updated as appropriate.   Please see review of systems for further details on the patient's review from today.   Objective:   Physical Exam:  There were no vitals taken for this visit.  Physical Exam Constitutional:      General: She is not in acute distress. Musculoskeletal:        General: No deformity.  Neurological:     Mental Status: She is alert and oriented to person, place, and time.     Coordination: Coordination normal.  Psychiatric:        Attention and Perception: Attention and perception normal. She does not perceive auditory or visual hallucinations.        Mood and Affect: Mood normal. Mood is  not anxious or depressed. Affect is not labile, blunt, angry or inappropriate.        Speech: Speech normal.        Behavior: Behavior normal.        Thought Content: Thought content normal. Thought content is not paranoid or delusional. Thought content does not include homicidal or suicidal ideation. Thought content does not include homicidal or suicidal plan.        Cognition and Memory: Cognition and memory normal.        Judgment: Judgment normal.     Comments: Insight intact     Lab Review:     Component Value Date/Time   NA 140 05/22/2022 0941   K 4.5 05/22/2022 0941   CL 100 05/22/2022 0941   CO2 21 05/22/2022 0941   GLUCOSE 78 05/22/2022 0941   GLUCOSE 108 (H) 04/01/2019 2236   BUN 11 05/22/2022 0941   CREATININE 1.04 (H) 05/22/2022 0941   CALCIUM 9.7 05/22/2022 0941   PROT 7.3 05/22/2022 0941   ALBUMIN 4.5 05/22/2022 0941   AST 16 05/22/2022 0941   ALT 11 05/22/2022 0941   ALKPHOS 87 05/22/2022 0941   BILITOT 0.4 05/22/2022 0941   GFRNONAA >60 04/01/2019 2236   GFRAA >60 04/01/2019 2236       Component Value Date/Time   WBC 7.5 07/03/2022 1431   WBC 8.5 04/01/2019 2236   RBC 4.25 07/03/2022 1431   HGB 12.7 07/03/2022 1431   HCT 37.4 07/03/2022 1431   PLT 495 (H) 07/03/2022 1431   MCV 88.0 07/03/2022 1431   MCH 29.9 07/03/2022 1431   MCHC 34.0 07/03/2022 1431   RDW 13.8 07/03/2022 1431   LYMPHSABS 2.3 07/03/2022 1431   MONOABS 0.5 07/03/2022 1431   EOSABS 0.1 07/03/2022 1431   BASOSABS 0.1 07/03/2022 1431    No results found for: POCLITH, LITHIUM   No results found for: PHENYTOIN, PHENOBARB, VALPROATE, CBMZ   .res Assessment: Plan:    Plan:  PDMP reviewed  Add Propranolol 10mg  TID - anxiety  Increase Vraylar  1.5mg  daily to 3mg  Lexapro  20mg   daily  Clonazepam  1.5mg  at hs Trazdone 100mg  - 2 at hs Wellbutrin  XL 450mg  daily. Denies seizure history.  Adderall XR 30mg  every morning ADD symptoms. Adderall 10mg  daily  Consider  Lamictal next visit  RTC 8 weeks  25 minutes spent dedicated to the care of this patient on the date of this encounter to include pre-visit review of records, ordering of medication, post visit documentation, and face-to-face time with the patient discussing MDD, GAD, PTSD, ADD, and insomnia. Discussed continuing current medication regimen.  Patient advised to contact office with any questions, adverse effects, or acute worsening in signs and symptoms.  Discussed potential benefits, risk, and side effects of benzodiazepines to include potential risk of tolerance and dependence, as well as possible drowsiness. Advised patient not to drive if experiencing drowsiness and to take lowest possible effective dose to minimize risk of dependence and tolerance.  Discussed potential metabolic side effects associated with atypical antipsychotics, as well as potential risk for movement side effects. Advised pt to contact office if movement side effects occur.   Diagnoses and all orders for this visit:  Major depressive disorder, recurrent episode, moderate (HCC) -     Discontinue: escitalopram  (LEXAPRO ) 20 MG tablet; Take 1 tablet (20 mg total) by mouth daily. -     Discontinue: cariprazine  (VRAYLAR ) 3 MG capsule; Take 1 capsule (3 mg total) by mouth daily. -     Discontinue: buPROPion  (WELLBUTRIN  XL) 150 MG 24 hr tablet; TAKE THREE TABLETS EVERY MORNING. -     buPROPion  (WELLBUTRIN  XL) 150 MG 24 hr tablet; TAKE THREE TABLETS EVERY MORNING. -     cariprazine  (VRAYLAR ) 3 MG capsule; Take 1 capsule (3 mg total) by mouth daily. -     escitalopram  (LEXAPRO ) 20 MG tablet; Take 1 tablet (20 mg total) by mouth daily.  Insomnia, unspecified type -     Discontinue: traZODone  (DESYREL ) 100 MG tablet; Take 1-2 tablets (100-200 mg total) by mouth at bedtime. -     traZODone  (DESYREL ) 100 MG tablet; Take 1-2 tablets (100-200 mg total) by mouth at bedtime.  Generalized anxiety disorder -     Discontinue:  escitalopram  (LEXAPRO ) 20 MG tablet; Take 1 tablet (20 mg total) by mouth daily. -     Discontinue: clonazePAM  (KLONOPIN ) 1 MG tablet; TAKE ONE TABLET TWICE DAILY. -     Discontinue: propranolol (INDERAL) 10 MG tablet; Take 1 tablet (10 mg total) by mouth 3 (three) times daily. -     escitalopram  (LEXAPRO ) 20 MG tablet; Take 1 tablet (20 mg total) by mouth daily. -     clonazePAM  (KLONOPIN ) 1 MG tablet; TAKE ONE TABLET TWICE DAILY. -     propranolol (INDERAL) 10 MG tablet; Take 1 tablet (10 mg total) by mouth 3 (three) times daily.  PTSD (post-traumatic stress disorder) -     Discontinue: escitalopram  (LEXAPRO ) 20 MG tablet; Take 1 tablet (20 mg total) by mouth daily. -     escitalopram  (LEXAPRO ) 20 MG tablet; Take 1 tablet (20 mg total) by mouth daily.  Attention deficit hyperactivity disorder (ADHD), combined type -     amphetamine -dextroamphetamine  (ADDERALL XR) 30 MG 24 hr capsule; Take 1 capsule (30 mg total) by mouth daily. -     amphetamine -dextroamphetamine  (ADDERALL) 10 MG tablet; Take 1 tablet (10 mg total) by mouth daily. -     amphetamine -dextroamphetamine  (ADDERALL XR) 30 MG 24 hr capsule; Take 1 capsule (30 mg total) by mouth daily. -     amphetamine -dextroamphetamine  (ADDERALL) 10 MG  tablet; Take 1 tablet (10 mg total) by mouth daily.     Please see After Visit Summary for patient specific instructions.  No future appointments.   No orders of the defined types were placed in this encounter.     -------------------------------

## 2024-02-03 ENCOUNTER — Telehealth: Admitting: Adult Health

## 2024-02-03 ENCOUNTER — Encounter: Payer: Self-pay | Admitting: Adult Health

## 2024-02-03 DIAGNOSIS — F4310 Post-traumatic stress disorder, unspecified: Secondary | ICD-10-CM | POA: Diagnosis not present

## 2024-02-03 DIAGNOSIS — F411 Generalized anxiety disorder: Secondary | ICD-10-CM

## 2024-02-03 DIAGNOSIS — G4700 Insomnia, unspecified: Secondary | ICD-10-CM

## 2024-02-03 DIAGNOSIS — F331 Major depressive disorder, recurrent, moderate: Secondary | ICD-10-CM | POA: Diagnosis not present

## 2024-02-03 DIAGNOSIS — F909 Attention-deficit hyperactivity disorder, unspecified type: Secondary | ICD-10-CM

## 2024-02-03 DIAGNOSIS — F902 Attention-deficit hyperactivity disorder, combined type: Secondary | ICD-10-CM

## 2024-02-03 NOTE — Progress Notes (Signed)
 Monica [name] [id number] Apr 29, 1970 53 y.o.  Virtual Visit via Video Note  I connected with pt @ on 02/03/24 at  5:00 PM EST by a video enabled telemedicine application and verified that I am speaking with the correct person using two identifiers.   I discussed the limitations of evaluation and management by telemedicine and the availability of in person appointments. The patient expressed understanding and agreed to proceed.  I discussed the assessment and treatment plan with the patient. The patient was provided an opportunity to ask questions and all were answered. The patient agreed with the plan and demonstrated an understanding of the instructions.   The patient was advised to call back or seek an in-person evaluation if the symptoms worsen or if the condition fails to improve as anticipated.  I provided 25 minutes of non-face-to-face time during this encounter.  The patient was located at home.  The provider was located at [facility].   Monica LOISE Sayers, NP   Subjective:   Patient ID:  Monica [name] is a 53 y.o. (DOB June 21, 1970) female.  Chief Complaint: No chief complaint on file.   HPI Monica [name] presents for follow-up of MDD, GAD, PTSD, ADD, and insomnia.  Describes mood today as ok. Reports tearfulness. Mood symptoms - reports decreased anxiety and depression. Denies irritability. Reports lower interest and motivation. Denies recent panic attacks. Denies worry and rumination. Reports over thinking - work related. Denies obsessive thoughts or acts. Reports still grieving loss of dog. Reports mood has improved. Stating I feel a little bit better - taken things day by day. Taking medications as prescribed. Seeing therapist monthly.  Energy levels lower. Active, does not have a regular exercise routine.  Enjoys some usual interests and activities. Single. Lives alone. Daughter living in [place]. Spending time with family and friends.  Appetite adequate.  Reports weight loss - [employer]. Sleeps better some nights than others. Averages 7 to 8 hours. Reports focus and concentration difficulties in the afternoon - Adderall helpful. Completing some tasks. Managing aspects of household. 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  Denies AH or VH. Denies self harm. Denies substance use.  Previous medication trials: Adderall, Trazadone, Clonazepam , Paxil, Sertraline, Gabapentin   Review of Systems:  Review of Systems  Musculoskeletal:  Negative for gait problem.  Neurological:  Negative for tremors.  Psychiatric/Behavioral:         Please refer to HPI    Medications: I have reviewed the patient's current medications.  Current Outpatient Medications  Medication Sig Dispense Refill   amphetamine -dextroamphetamine  (ADDERALL XR) 30 MG 24 hr capsule Take 1 capsule (30 mg total) by mouth daily. 30 capsule 0   amphetamine -dextroamphetamine  (ADDERALL XR) 30 MG 24 hr capsule Take 1 capsule (30 mg total) by mouth daily. 30 capsule 0   amphetamine -dextroamphetamine  (ADDERALL) 10 MG tablet Take 1 tablet (10 mg total) by mouth daily. 30 tablet 0   amphetamine -dextroamphetamine  (ADDERALL) 10 MG tablet Take 1 tablet (10 mg total) by mouth daily. 30 tablet 0   buPROPion  (WELLBUTRIN  XL) 150 MG 24 hr tablet TAKE THREE TABLETS EVERY MORNING. 270 tablet 0   cariprazine  (VRAYLAR ) 3 MG capsule Take 1 capsule (3 mg total) by mouth daily. 30 capsule 2   cetirizine (ZYRTEC) 10 MG tablet Take 10 mg by mouth daily.     clonazePAM  (KLONOPIN ) 1 MG tablet TAKE ONE TABLET TWICE DAILY. 60 tablet 2   escitalopram  (LEXAPRO ) 20 MG tablet Take 1 tablet (20 mg total) by mouth  daily. 90 tablet 1   fluticasone  (FLONASE ) 50 MCG/ACT nasal spray Place 1 spray daily into both nostrils.     Insulin  Pen Needle (B-D UF III MINI PEN NEEDLES) 31G X 5 MM MISC Use once daily as directed 100 each 0   levonorgestrel (MIRENA) 20 MCG/DAY IUD 1 each by Intrauterine route once.      Phentermine-Topiramate  (QSYMIA ) 7.5-46 MG CP24 1 capsule po qAM 30 capsule 0   propranolol  (INDERAL ) 10 MG tablet Take 1 tablet (10 mg total) by mouth 3 (three) times daily. 90 tablet 0   traZODone  (DESYREL ) 100 MG tablet Take 1-2 tablets (100-200 mg total) by mouth at bedtime. 180 tablet 1   valACYclovir (VALTREX) 500 MG tablet Take 500 mg by mouth 2 (two) times daily as needed (flare up).     Vitamin D , Ergocalciferol , (DRISDOL ) 1.25 MG (50000 UNIT) CAPS capsule Take 1 capsule (50,000 Units total) by mouth every 7 (seven) days. 4 capsule 0   No current facility-administered medications for this visit.    Medication Side Effects: None  Allergies:  Allergies  Allergen Reactions   Pristiq [Desvenlafaxine] Other (See Comments)    Not effective   Sertraline Other (See Comments)    Did not feel emotion   Pepto-Bismol [Bismuth Subsalicylate] Rash   Septra [Sulfamethoxazole-Trimethoprim] Rash   Sulfa Antibiotics Rash    Past Medical History:  Diagnosis Date   ADD (attention deficit disorder)    Anxiety    Cholelithiases    symptomatic   Depression    Environmental and seasonal allergies    GAD (generalized anxiety disorder)    Gallbladder disease    Heart murmur    per pt told very faint , no symptoms, told nothing to worry about;  pt had echo done 12-24-2011 in epic ef 55-60% and trace MR   Herpes simplex type 1 infection    History of MRSA infection    left great toe in 2010   History of palpitations    ED visit in epic 05/ 2017;  evaluated by cardiology--- dr shaunna. okey note in epic 08-31-2015 , ST on EKG , no further work-up;  pt had previou echo in epic 12-24-2011 ef 55-60%   History of suicidal behavior 03/2019   [facility] admission for IVC   Insomnia    unspecified   Palpitations    PTSD (post-traumatic stress disorder)    Wears glasses     Family History  Problem Relation Age of Onset   Bipolar disorder Mother    Depression Mother    Anxiety disorder Mother    Cancer  Father    Lung cancer Father    Migraines Neg Hx     Social History   Socioeconomic History   Marital status: Divorced    Spouse name: Not on file   Number of children: 1   Years of education: Not on file   Highest education level: Bachelor's degree (e.g., BA, AB, BS)  Occupational History   Not on file  Tobacco Use   Smoking status: Former    Current packs/day: 0.00    Types: Cigarettes    Start date: 58    Quit date: 2002    Years since quitting: 23.9   Smokeless tobacco: Never  Vaping Use   Vaping status: Never Used  Substance and Sexual Activity   Alcohol use: Not Currently    Comment: occasional   Drug use: Never   Sexual activity: Not on file  Other Topics Concern  Not on file  Social History Narrative   Lives at home with fiance   Right handed   2 cups of caffeine daily   Social Drivers of Corporate Investment Banker Strain: Not on file  Food Insecurity: Not on file  Transportation Needs: Not on file  Physical Activity: Not on file  Stress: Not on file  Social Connections: Not on file  Intimate Partner Violence: Not on file    Past Medical History, Surgical history, Social history, and Family history were reviewed and updated as appropriate.   Please see review of systems for further details on the patient's review from today.   Objective:   Physical Exam:  There were no vitals taken for this visit.  Physical Exam Constitutional:      General: She is not in acute distress. Musculoskeletal:        General: No deformity.  Neurological:     Mental Status: She is alert and oriented to person, place, and time.     Coordination: Coordination normal.  Psychiatric:        Attention and Perception: Attention and perception normal. She does not perceive auditory or visual hallucinations.        Mood and Affect: Mood normal. Mood is not anxious or depressed. Affect is not labile, blunt, angry or inappropriate.        Speech: Speech normal.         Behavior: Behavior normal.        Thought Content: Thought content normal. Thought content is not paranoid or delusional. Thought content does not include homicidal or suicidal ideation. Thought content does not include homicidal or suicidal plan.        Cognition and Memory: Cognition and memory normal.        Judgment: Judgment normal.     Comments: Insight intact     Lab Review:     Component Value Date/Time   NA 140 05/22/2022 0941   K 4.5 05/22/2022 0941   CL 100 05/22/2022 0941   CO2 21 05/22/2022 0941   GLUCOSE 78 05/22/2022 0941   GLUCOSE 108 (H) 04/01/2019 2236   BUN 11 05/22/2022 0941   CREATININE 1.04 (H) 05/22/2022 0941   CALCIUM 9.7 05/22/2022 0941   PROT 7.3 05/22/2022 0941   ALBUMIN 4.5 05/22/2022 0941   AST 16 05/22/2022 0941   ALT 11 05/22/2022 0941   ALKPHOS 87 05/22/2022 0941   BILITOT 0.4 05/22/2022 0941   GFRNONAA >60 04/01/2019 2236   GFRAA >60 04/01/2019 2236       Component Value Date/Time   WBC 7.5 07/03/2022 1431   WBC 8.5 04/01/2019 2236   RBC 4.25 07/03/2022 1431   HGB 12.7 07/03/2022 1431   HCT 37.4 07/03/2022 1431   PLT 495 (H) 07/03/2022 1431   MCV 88.0 07/03/2022 1431   MCH 29.9 07/03/2022 1431   MCHC 34.0 07/03/2022 1431   RDW 13.8 07/03/2022 1431   LYMPHSABS 2.3 07/03/2022 1431   MONOABS 0.5 07/03/2022 1431   EOSABS 0.1 07/03/2022 1431   BASOSABS 0.1 07/03/2022 1431    No results found for: POCLITH, LITHIUM   No results found for: PHENYTOIN, PHENOBARB, VALPROATE, CBMZ   .res Assessment: Plan:    Plan:  PDMP reviewed  Propranolol  10mg  TID - anxiety Vraylar  3mg  daily  Lexapro  20mg  daily  Clonazepam  1.5mg  at hs Trazdone 100mg  - 2 at hs Wellbutrin  XL 450mg  daily. Denies seizure history.  Adderall XR 30mg  every morning ADD symptoms. Adderall 10mg   daily  Consider Lamictal next visit  RTC 8 weeks  25 minutes spent dedicated to the care of this patient on the date of this encounter to include pre-visit  review of records, ordering of medication, post visit documentation, and face-to-face time with the patient discussing MDD, GAD, PTSD, ADD, and insomnia. Discussed continuing current medication regimen.  Patient advised to contact office with any questions, adverse effects, or acute worsening in signs and symptoms.  Discussed potential benefits, risk, and side effects of benzodiazepines to include potential risk of tolerance and dependence, as well as possible drowsiness. Advised patient not to drive if experiencing drowsiness and to take lowest possible effective dose to minimize risk of dependence and tolerance.  Discussed potential metabolic side effects associated with atypical antipsychotics, as well as potential risk for movement side effects. Advised pt to contact office if movement side effects occur.   There are no diagnoses linked to this encounter.   Please see After Visit Summary for patient specific instructions.  Future Appointments  Date Time Provider Department Center  02/03/2024  5:00 PM Nekhi Liwanag Nattalie, NP [facility] None    No orders of the defined types were placed in this encounter.     -------------------------------

## 2024-02-21 ENCOUNTER — Other Ambulatory Visit: Payer: Self-pay | Admitting: Adult Health

## 2024-02-21 DIAGNOSIS — F411 Generalized anxiety disorder: Secondary | ICD-10-CM

## 2024-02-22 NOTE — Telephone Encounter (Signed)
 Pt due for FU in Jan, please schedule.

## 2024-02-26 NOTE — Telephone Encounter (Signed)
 I LVM for pt to call back for appt

## 2024-03-15 ENCOUNTER — Telehealth: Admitting: Adult Health

## 2024-03-23 ENCOUNTER — Other Ambulatory Visit: Payer: Self-pay

## 2024-03-23 ENCOUNTER — Telehealth: Payer: Self-pay | Admitting: Adult Health

## 2024-03-23 DIAGNOSIS — F902 Attention-deficit hyperactivity disorder, combined type: Secondary | ICD-10-CM

## 2024-03-23 MED ORDER — AMPHETAMINE-DEXTROAMPHET ER 30 MG PO CP24: 30.0000 mg | ORAL_CAPSULE | Freq: Every day | ORAL | 0 refills | Status: DC

## 2024-03-23 NOTE — Telephone Encounter (Signed)
 Pt needs rf of Adderall XR 30    [id number] - [place], [place] - [address]  [address], [place] [id number]

## 2024-03-29 ENCOUNTER — Telehealth (INDEPENDENT_AMBULATORY_CARE_PROVIDER_SITE_OTHER): Admitting: Adult Health

## 2024-03-29 ENCOUNTER — Encounter: Payer: Self-pay | Admitting: Adult Health

## 2024-03-29 DIAGNOSIS — G4700 Insomnia, unspecified: Secondary | ICD-10-CM

## 2024-03-29 DIAGNOSIS — F411 Generalized anxiety disorder: Secondary | ICD-10-CM | POA: Diagnosis not present

## 2024-03-29 DIAGNOSIS — F329 Major depressive disorder, single episode, unspecified: Secondary | ICD-10-CM

## 2024-03-29 DIAGNOSIS — F4310 Post-traumatic stress disorder, unspecified: Secondary | ICD-10-CM | POA: Diagnosis not present

## 2024-03-29 DIAGNOSIS — F331 Major depressive disorder, recurrent, moderate: Secondary | ICD-10-CM

## 2024-03-29 DIAGNOSIS — F909 Attention-deficit hyperactivity disorder, unspecified type: Secondary | ICD-10-CM | POA: Diagnosis not present

## 2024-03-29 DIAGNOSIS — F902 Attention-deficit hyperactivity disorder, combined type: Secondary | ICD-10-CM

## 2024-03-29 MED ORDER — AMPHETAMINE-DEXTROAMPHET ER 30 MG PO CP24: 30.0000 mg | ORAL_CAPSULE | Freq: Every day | ORAL | 0 refills | Status: AC

## 2024-03-29 MED ORDER — CLONAZEPAM 1 MG PO TABS: ORAL_TABLET | ORAL | 2 refills | Status: AC

## 2024-03-29 MED ORDER — AMPHETAMINE-DEXTROAMPHETAMINE 10 MG PO TABS: 10.0000 mg | ORAL_TABLET | Freq: Every day | ORAL | 0 refills | Status: AC

## 2024-03-29 MED ORDER — PROPRANOLOL HCL 10 MG PO TABS: 10.0000 mg | ORAL_TABLET | Freq: Three times a day (TID) | ORAL | 0 refills | Status: AC

## 2024-03-29 MED ORDER — BUPROPION HCL ER (XL) 150 MG PO TB24: ORAL_TABLET | ORAL | 0 refills | Status: AC

## 2024-03-29 MED ORDER — CARIPRAZINE HCL 3 MG PO CAPS: 3.0000 mg | ORAL_CAPSULE | Freq: Every day | ORAL | 2 refills | Status: AC

## 2024-03-29 NOTE — Progress Notes (Unsigned)
 Monica [name] [id number] 1970-05-14 54 y.o.  Virtual Visit via Video Note  I connected with pt @ on 03/29/24 at  5:30 PM EST by a video enabled telemedicine application and verified that I am speaking with the correct person using two identifiers.   I discussed the limitations of evaluation and management by telemedicine and the availability of in person appointments. The patient expressed understanding and agreed to proceed.  I discussed the assessment and treatment plan with the patient. The patient was provided an opportunity to ask questions and all were answered. The patient agreed with the plan and demonstrated an understanding of the instructions.   The patient was advised to call back or seek an in-person evaluation if the symptoms worsen or if the condition fails to improve as anticipated.  I provided 25 minutes of non-face-to-face time during this encounter.  The patient was located at home.  The provider was located at [facility].   Monica [name] Monica Sayers, NP   Subjective:   Patient ID:  Monica [name] is a 54 y.o. (DOB 30-Jul-1970) female.  Chief Complaint: No chief complaint on file.   HPI Monica [name] presents for follow-up of MDD, GAD, PTSD, ADD, and insomnia.  Describes mood today as ok. Reports tearfulness. Mood symptoms - denies depression and irritability. Reports feeling anxious at times. Reports stable interest and motivation. Denies recent panic attacks. Denies worry and rumination. Denies over thinking. Reports some obsessive thoughts.  Reports mood is stable. Stating I feel like I'm doing ok. Taking medications as prescribed. Seeing therapist monthly.  Energy levels lower. Active, does not have a regular exercise routine.  Enjoys some usual interests and activities. Single. Lives alone. Daughter living in [place]. Spending time with family and friends.  Appetite adequate. Reports weight loss. Reports sleeping better some nights than others.  Averages 7 to 8 hours.  Reports focus and concentration difficulties - Adderall helpful. Completing some tasks. Managing aspects of household. 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  Denies AH or VH. Denies self harm. Denies substance use.  Previous medication trials: Adderall, Trazadone, Clonazepam , Paxil, Sertraline, Gabapentin    Review of Systems:  Review of Systems  Musculoskeletal:  Negative for gait problem.  Neurological:  Negative for tremors.  Psychiatric/Behavioral:         Please refer to HPI    Medications: I have reviewed the patient's current medications.  Current Outpatient Medications  Medication Sig Dispense Refill   amphetamine -dextroamphetamine  (ADDERALL XR) 30 MG 24 hr capsule Take 1 capsule (30 mg total) by mouth daily. 30 capsule 0   amphetamine -dextroamphetamine  (ADDERALL XR) 30 MG 24 hr capsule Take 1 capsule (30 mg total) by mouth daily. 30 capsule 0   amphetamine -dextroamphetamine  (ADDERALL) 10 MG tablet Take 1 tablet (10 mg total) by mouth daily. 30 tablet 0   amphetamine -dextroamphetamine  (ADDERALL) 10 MG tablet Take 1 tablet (10 mg total) by mouth daily. 30 tablet 0   buPROPion  (WELLBUTRIN  XL) 150 MG 24 hr tablet TAKE THREE TABLETS EVERY MORNING. 270 tablet 0   cariprazine  (VRAYLAR ) 3 MG capsule Take 1 capsule (3 mg total) by mouth daily. 30 capsule 2   cetirizine (ZYRTEC) 10 MG tablet Take 10 mg by mouth daily.     clonazePAM  (KLONOPIN ) 1 MG tablet TAKE ONE TABLET TWICE DAILY. 60 tablet 2   escitalopram  (LEXAPRO ) 20 MG tablet Take 1 tablet (20 mg total) by mouth daily. 90 tablet 1   fluticasone  (FLONASE ) 50 MCG/ACT nasal spray Place 1 spray  daily into both nostrils.     Insulin  Pen Needle (B-D UF III MINI PEN NEEDLES) 31G X 5 MM MISC Use once daily as directed 100 each 0   levonorgestrel (MIRENA) 20 MCG/DAY IUD 1 each by Intrauterine route once.     Phentermine-Topiramate  (QSYMIA ) 7.5-46 MG CP24 1 capsule po qAM 30 capsule 0   propranolol   (INDERAL ) 10 MG tablet TAKE 1 TABLET BY MOUTH THREE TIMES A DAY 90 tablet 0   traZODone  (DESYREL ) 100 MG tablet Take 1-2 tablets (100-200 mg total) by mouth at bedtime. 180 tablet 1   valACYclovir (VALTREX) 500 MG tablet Take 500 mg by mouth 2 (two) times daily as needed (flare up).     Vitamin D , Ergocalciferol , (DRISDOL ) 1.25 MG (50000 UNIT) CAPS capsule Take 1 capsule (50,000 Units total) by mouth every 7 (seven) days. 4 capsule 0   No current facility-administered medications for this visit.    Medication Side Effects: None  Allergies: Allergies[1]  Past Medical History:  Diagnosis Date   ADD (attention deficit disorder)    Anxiety    Cholelithiases    symptomatic   Depression    Environmental and seasonal allergies    GAD (generalized anxiety disorder)    Gallbladder disease    Heart murmur    per pt told very faint , no symptoms, told nothing to worry about;  pt had echo done 12-24-2011 in epic ef 55-60% and trace MR   Herpes simplex type 1 infection    History of MRSA infection    left great toe in 2010   History of palpitations    ED visit in epic 05/ 2017;  evaluated by cardiology--- dr shaunna. okey note in epic 08-31-2015 , ST on EKG , no further work-up;  pt had previou echo in epic 12-24-2011 ef 55-60%   History of suicidal behavior 03/2019   [facility] admission for IVC   Insomnia    unspecified   Palpitations    PTSD (post-traumatic stress disorder)    Wears glasses     Family History  Problem Relation Age of Onset   Bipolar disorder Mother    Depression Mother    Anxiety disorder Mother    Cancer Father    Lung cancer Father    Migraines Neg Hx     Social History   Socioeconomic History   Marital status: Divorced    Spouse name: Not on file   Number of children: 1   Years of education: Not on file   Highest education level: Bachelor's degree (e.g., BA, AB, BS)  Occupational History   Not on file  Tobacco Use   Smoking status: Former    Current packs/day:  0.00    Types: Cigarettes    Start date: 67    Quit date: 2002    Years since quitting: 24.0   Smokeless tobacco: Never  Vaping Use   Vaping status: Never Used  Substance and Sexual Activity   Alcohol use: Not Currently    Comment: occasional   Drug use: Never   Sexual activity: Not on file  Other Topics Concern   Not on file  Social History Narrative   Lives at home with fiance   Right handed   2 cups of caffeine daily   Social Drivers of Health   Tobacco Use: Medium Risk (02/03/2024)   Patient History    Smoking Tobacco Use: Former    Smokeless Tobacco Use: Never    Passive Exposure: Not on file  Financial Resource Strain: Not on file  Food Insecurity: Not on file  Transportation Needs: Not on file  Physical Activity: Not on file  Stress: Not on file  Social Connections: Not on file  Intimate Partner Violence: Not on file  Depression (EYV7-0): Not on file  Alcohol Screen: Not on file  Housing: Not on file  Utilities: Not on file  Health Literacy: Not on file    Past Medical History, Surgical history, Social history, and Family history were reviewed and updated as appropriate.   Please see review of systems for further details on the patient's review from today.   Objective:   Physical Exam:  There were no vitals taken for this visit.  Physical Exam Constitutional:      General: She is not in acute distress. Musculoskeletal:        General: No deformity.  Neurological:     Mental Status: She is alert and oriented to person, place, and time.     Coordination: Coordination normal.  Psychiatric:        Attention and Perception: Attention and perception normal. She does not perceive auditory or visual hallucinations.        Mood and Affect: Mood normal. Mood is not anxious or depressed. Affect is not labile, blunt, angry or inappropriate.        Speech: Speech normal.        Behavior: Behavior normal.        Thought Content: Thought content normal.  Thought content is not paranoid or delusional. Thought content does not include homicidal or suicidal ideation. Thought content does not include homicidal or suicidal plan.        Cognition and Memory: Cognition and memory normal.        Judgment: Judgment normal.     Comments: Insight intact     Lab Review:     Component Value Date/Time   NA 140 05/22/2022 0941   K 4.5 05/22/2022 0941   CL 100 05/22/2022 0941   CO2 21 05/22/2022 0941   GLUCOSE 78 05/22/2022 0941   GLUCOSE 108 (H) 04/01/2019 2236   BUN 11 05/22/2022 0941   CREATININE 1.04 (H) 05/22/2022 0941   CALCIUM 9.7 05/22/2022 0941   PROT 7.3 05/22/2022 0941   ALBUMIN 4.5 05/22/2022 0941   AST 16 05/22/2022 0941   ALT 11 05/22/2022 0941   ALKPHOS 87 05/22/2022 0941   BILITOT 0.4 05/22/2022 0941   GFRNONAA >60 04/01/2019 2236   GFRAA >60 04/01/2019 2236       Component Value Date/Time   WBC 7.5 07/03/2022 1431   WBC 8.5 04/01/2019 2236   RBC 4.25 07/03/2022 1431   HGB 12.7 07/03/2022 1431   HCT 37.4 07/03/2022 1431   PLT 495 (H) 07/03/2022 1431   MCV 88.0 07/03/2022 1431   MCH 29.9 07/03/2022 1431   MCHC 34.0 07/03/2022 1431   RDW 13.8 07/03/2022 1431   LYMPHSABS 2.3 07/03/2022 1431   MONOABS 0.5 07/03/2022 1431   EOSABS 0.1 07/03/2022 1431   BASOSABS 0.1 07/03/2022 1431    No results found for: POCLITH, LITHIUM   No results found for: PHENYTOIN, PHENOBARB, VALPROATE, CBMZ   .res Assessment: Plan:    Plan:  PDMP reviewed  Propranolol  10mg  TID - anxiety Vraylar  3mg  daily  Lexapro  20mg  daily  Clonazepam  1.5mg  at hs Trazdone 100mg  - 2 at hs Wellbutrin  XL 450mg  daily. Denies seizure history.  Adderall XR 30mg  every morning ADD symptoms. Adderall 10mg  daily  Consider Lamictal next  visit  RTC 2 months  25 minutes spent dedicated to the care of this patient on the date of this encounter to include pre-visit review of records, ordering of medication, post visit documentation, and  face-to-face time with the patient discussing MDD, GAD, PTSD, ADD, and insomnia. Discussed continuing current medication regimen.  Patient advised to contact office with any questions, adverse effects, or acute worsening in signs and symptoms.  Discussed potential benefits, risk, and side effects of benzodiazepines to include potential risk of tolerance and dependence, as well as possible drowsiness. Advised patient not to drive if experiencing drowsiness and to take lowest possible effective dose to minimize risk of dependence and tolerance.  Discussed potential metabolic side effects associated with atypical antipsychotics, as well as potential risk for movement side effects. Advised pt to contact office if movement side effects occur.   There are no diagnoses linked to this encounter.   Please see After Visit Summary for patient specific instructions.  Future Appointments  Date Time Provider Department Center  03/29/2024  5:30 PM Sherrey North Nattalie, NP [facility] None    No orders of the defined types were placed in this encounter.     -------------------------------     [1]  Allergies Allergen Reactions   Pristiq [Desvenlafaxine] Other (See Comments)    Not effective   Sertraline Other (See Comments)    Did not feel emotion   Pepto-Bismol [Bismuth Subsalicylate] Rash   Septra [Sulfamethoxazole-Trimethoprim] Rash   Sulfa Antibiotics Rash
